# Patient Record
Sex: Female | Born: 1940
Health system: Southern US, Community
[De-identification: ages and names within clinical notes are randomized; demographics above are authoritative.]

## PROBLEM LIST (undated history)

## (undated) DIAGNOSIS — M1990 Unspecified osteoarthritis, unspecified site: Secondary | ICD-10-CM

## (undated) DIAGNOSIS — K219 Gastro-esophageal reflux disease without esophagitis: Secondary | ICD-10-CM

## (undated) DIAGNOSIS — I10 Essential (primary) hypertension: Secondary | ICD-10-CM

## (undated) DIAGNOSIS — L4050 Arthropathic psoriasis, unspecified: Secondary | ICD-10-CM

## (undated) DIAGNOSIS — E785 Hyperlipidemia, unspecified: Secondary | ICD-10-CM

## (undated) HISTORY — PX: NO PAST SURGERIES: SHX2092

---

## 2009-08-01 ENCOUNTER — Inpatient Hospital Stay: Payer: Self-pay | Admitting: Student

## 2009-10-13 ENCOUNTER — Ambulatory Visit: Payer: Self-pay | Admitting: Internal Medicine

## 2010-11-09 ENCOUNTER — Ambulatory Visit: Payer: Self-pay | Admitting: Internal Medicine

## 2010-11-11 ENCOUNTER — Ambulatory Visit: Payer: Self-pay | Admitting: Internal Medicine

## 2011-04-12 ENCOUNTER — Ambulatory Visit: Payer: Self-pay

## 2011-09-14 DIAGNOSIS — H2510 Age-related nuclear cataract, unspecified eye: Secondary | ICD-10-CM | POA: Diagnosis not present

## 2011-09-14 DIAGNOSIS — H40019 Open angle with borderline findings, low risk, unspecified eye: Secondary | ICD-10-CM | POA: Diagnosis not present

## 2011-12-06 ENCOUNTER — Ambulatory Visit: Payer: Self-pay | Admitting: Internal Medicine

## 2011-12-06 DIAGNOSIS — M818 Other osteoporosis without current pathological fracture: Secondary | ICD-10-CM | POA: Diagnosis not present

## 2011-12-06 DIAGNOSIS — D509 Iron deficiency anemia, unspecified: Secondary | ICD-10-CM | POA: Diagnosis not present

## 2011-12-06 DIAGNOSIS — N6459 Other signs and symptoms in breast: Secondary | ICD-10-CM | POA: Diagnosis not present

## 2011-12-06 DIAGNOSIS — Z1231 Encounter for screening mammogram for malignant neoplasm of breast: Secondary | ICD-10-CM | POA: Diagnosis not present

## 2011-12-06 DIAGNOSIS — E039 Hypothyroidism, unspecified: Secondary | ICD-10-CM | POA: Diagnosis not present

## 2011-12-06 DIAGNOSIS — K219 Gastro-esophageal reflux disease without esophagitis: Secondary | ICD-10-CM | POA: Diagnosis not present

## 2011-12-06 DIAGNOSIS — M159 Polyosteoarthritis, unspecified: Secondary | ICD-10-CM | POA: Diagnosis not present

## 2011-12-14 ENCOUNTER — Ambulatory Visit: Payer: Self-pay | Admitting: Internal Medicine

## 2011-12-14 DIAGNOSIS — R928 Other abnormal and inconclusive findings on diagnostic imaging of breast: Secondary | ICD-10-CM | POA: Diagnosis not present

## 2011-12-14 DIAGNOSIS — N6489 Other specified disorders of breast: Secondary | ICD-10-CM | POA: Diagnosis not present

## 2012-04-05 DIAGNOSIS — E039 Hypothyroidism, unspecified: Secondary | ICD-10-CM | POA: Diagnosis not present

## 2012-04-05 DIAGNOSIS — D509 Iron deficiency anemia, unspecified: Secondary | ICD-10-CM | POA: Diagnosis not present

## 2012-04-05 DIAGNOSIS — R030 Elevated blood-pressure reading, without diagnosis of hypertension: Secondary | ICD-10-CM | POA: Diagnosis not present

## 2012-04-05 DIAGNOSIS — M818 Other osteoporosis without current pathological fracture: Secondary | ICD-10-CM | POA: Diagnosis not present

## 2012-05-24 DIAGNOSIS — R5381 Other malaise: Secondary | ICD-10-CM | POA: Diagnosis not present

## 2012-05-24 DIAGNOSIS — E559 Vitamin D deficiency, unspecified: Secondary | ICD-10-CM | POA: Diagnosis not present

## 2012-05-24 DIAGNOSIS — E782 Mixed hyperlipidemia: Secondary | ICD-10-CM | POA: Diagnosis not present

## 2012-05-24 DIAGNOSIS — D509 Iron deficiency anemia, unspecified: Secondary | ICD-10-CM | POA: Diagnosis not present

## 2012-05-24 DIAGNOSIS — Z0000 Encounter for general adult medical examination without abnormal findings: Secondary | ICD-10-CM | POA: Diagnosis not present

## 2012-05-28 DIAGNOSIS — Z23 Encounter for immunization: Secondary | ICD-10-CM | POA: Diagnosis not present

## 2012-09-12 DIAGNOSIS — H2510 Age-related nuclear cataract, unspecified eye: Secondary | ICD-10-CM | POA: Diagnosis not present

## 2012-09-12 DIAGNOSIS — H4720 Unspecified optic atrophy: Secondary | ICD-10-CM | POA: Diagnosis not present

## 2012-09-24 DIAGNOSIS — R112 Nausea with vomiting, unspecified: Secondary | ICD-10-CM | POA: Diagnosis not present

## 2012-09-24 DIAGNOSIS — D509 Iron deficiency anemia, unspecified: Secondary | ICD-10-CM | POA: Diagnosis not present

## 2012-09-24 DIAGNOSIS — M818 Other osteoporosis without current pathological fracture: Secondary | ICD-10-CM | POA: Diagnosis not present

## 2012-09-24 DIAGNOSIS — K219 Gastro-esophageal reflux disease without esophagitis: Secondary | ICD-10-CM | POA: Diagnosis not present

## 2012-09-24 DIAGNOSIS — G4700 Insomnia, unspecified: Secondary | ICD-10-CM | POA: Diagnosis not present

## 2012-09-24 DIAGNOSIS — R030 Elevated blood-pressure reading, without diagnosis of hypertension: Secondary | ICD-10-CM | POA: Diagnosis not present

## 2012-10-18 DIAGNOSIS — M159 Polyosteoarthritis, unspecified: Secondary | ICD-10-CM | POA: Diagnosis not present

## 2012-10-18 DIAGNOSIS — D509 Iron deficiency anemia, unspecified: Secondary | ICD-10-CM | POA: Diagnosis not present

## 2012-10-18 DIAGNOSIS — M818 Other osteoporosis without current pathological fracture: Secondary | ICD-10-CM | POA: Diagnosis not present

## 2012-10-18 DIAGNOSIS — Z0000 Encounter for general adult medical examination without abnormal findings: Secondary | ICD-10-CM | POA: Diagnosis not present

## 2012-10-18 DIAGNOSIS — G4700 Insomnia, unspecified: Secondary | ICD-10-CM | POA: Diagnosis not present

## 2012-10-18 DIAGNOSIS — K219 Gastro-esophageal reflux disease without esophagitis: Secondary | ICD-10-CM | POA: Diagnosis not present

## 2012-10-18 DIAGNOSIS — R030 Elevated blood-pressure reading, without diagnosis of hypertension: Secondary | ICD-10-CM | POA: Diagnosis not present

## 2012-12-13 ENCOUNTER — Ambulatory Visit: Payer: Self-pay

## 2012-12-13 DIAGNOSIS — N63 Unspecified lump in unspecified breast: Secondary | ICD-10-CM | POA: Diagnosis not present

## 2012-12-13 DIAGNOSIS — Z1231 Encounter for screening mammogram for malignant neoplasm of breast: Secondary | ICD-10-CM | POA: Diagnosis not present

## 2013-04-18 DIAGNOSIS — K219 Gastro-esophageal reflux disease without esophagitis: Secondary | ICD-10-CM | POA: Diagnosis not present

## 2013-04-18 DIAGNOSIS — Z23 Encounter for immunization: Secondary | ICD-10-CM | POA: Diagnosis not present

## 2013-04-18 DIAGNOSIS — M818 Other osteoporosis without current pathological fracture: Secondary | ICD-10-CM | POA: Diagnosis not present

## 2013-04-18 DIAGNOSIS — N958 Other specified menopausal and perimenopausal disorders: Secondary | ICD-10-CM | POA: Diagnosis not present

## 2013-05-21 DIAGNOSIS — Z23 Encounter for immunization: Secondary | ICD-10-CM | POA: Diagnosis not present

## 2013-09-25 DIAGNOSIS — H3560 Retinal hemorrhage, unspecified eye: Secondary | ICD-10-CM | POA: Diagnosis not present

## 2013-09-25 DIAGNOSIS — H2510 Age-related nuclear cataract, unspecified eye: Secondary | ICD-10-CM | POA: Diagnosis not present

## 2013-11-27 DIAGNOSIS — H3560 Retinal hemorrhage, unspecified eye: Secondary | ICD-10-CM | POA: Diagnosis not present

## 2013-12-03 DIAGNOSIS — Z0000 Encounter for general adult medical examination without abnormal findings: Secondary | ICD-10-CM | POA: Diagnosis not present

## 2013-12-03 DIAGNOSIS — D691 Qualitative platelet defects: Secondary | ICD-10-CM | POA: Diagnosis not present

## 2013-12-03 DIAGNOSIS — K219 Gastro-esophageal reflux disease without esophagitis: Secondary | ICD-10-CM | POA: Diagnosis not present

## 2013-12-03 DIAGNOSIS — Z79899 Other long term (current) drug therapy: Secondary | ICD-10-CM | POA: Diagnosis not present

## 2013-12-03 DIAGNOSIS — R233 Spontaneous ecchymoses: Secondary | ICD-10-CM | POA: Diagnosis not present

## 2013-12-03 DIAGNOSIS — M159 Polyosteoarthritis, unspecified: Secondary | ICD-10-CM | POA: Diagnosis not present

## 2013-12-17 ENCOUNTER — Ambulatory Visit: Payer: Self-pay | Admitting: Internal Medicine

## 2013-12-17 DIAGNOSIS — Z1231 Encounter for screening mammogram for malignant neoplasm of breast: Secondary | ICD-10-CM | POA: Diagnosis not present

## 2014-04-16 DIAGNOSIS — D509 Iron deficiency anemia, unspecified: Secondary | ICD-10-CM | POA: Diagnosis not present

## 2014-04-16 DIAGNOSIS — M159 Polyosteoarthritis, unspecified: Secondary | ICD-10-CM | POA: Diagnosis not present

## 2014-04-16 DIAGNOSIS — R233 Spontaneous ecchymoses: Secondary | ICD-10-CM | POA: Diagnosis not present

## 2014-04-16 DIAGNOSIS — K219 Gastro-esophageal reflux disease without esophagitis: Secondary | ICD-10-CM | POA: Diagnosis not present

## 2014-04-16 DIAGNOSIS — M818 Other osteoporosis without current pathological fracture: Secondary | ICD-10-CM | POA: Diagnosis not present

## 2014-05-27 DIAGNOSIS — Z23 Encounter for immunization: Secondary | ICD-10-CM | POA: Diagnosis not present

## 2014-08-26 DIAGNOSIS — H52223 Regular astigmatism, bilateral: Secondary | ICD-10-CM | POA: Diagnosis not present

## 2014-08-26 DIAGNOSIS — H2513 Age-related nuclear cataract, bilateral: Secondary | ICD-10-CM | POA: Diagnosis not present

## 2014-08-26 DIAGNOSIS — H4720 Unspecified optic atrophy: Secondary | ICD-10-CM | POA: Diagnosis not present

## 2014-12-22 ENCOUNTER — Ambulatory Visit
Admit: 2014-12-22 | Disposition: A | Discharge status: Home / Self Care | Payer: Self-pay | Attending: Internal Medicine | Admitting: Internal Medicine

## 2014-12-22 DIAGNOSIS — Z1231 Encounter for screening mammogram for malignant neoplasm of breast: Secondary | ICD-10-CM | POA: Diagnosis not present

## 2015-02-09 DIAGNOSIS — D509 Iron deficiency anemia, unspecified: Secondary | ICD-10-CM | POA: Diagnosis not present

## 2015-02-09 DIAGNOSIS — R300 Dysuria: Secondary | ICD-10-CM | POA: Diagnosis not present

## 2015-02-09 DIAGNOSIS — I10 Essential (primary) hypertension: Secondary | ICD-10-CM | POA: Diagnosis not present

## 2015-02-09 DIAGNOSIS — Z0001 Encounter for general adult medical examination with abnormal findings: Secondary | ICD-10-CM | POA: Diagnosis not present

## 2015-02-09 DIAGNOSIS — M810 Age-related osteoporosis without current pathological fracture: Secondary | ICD-10-CM | POA: Diagnosis not present

## 2015-02-09 DIAGNOSIS — E559 Vitamin D deficiency, unspecified: Secondary | ICD-10-CM | POA: Diagnosis not present

## 2015-02-09 DIAGNOSIS — K219 Gastro-esophageal reflux disease without esophagitis: Secondary | ICD-10-CM | POA: Diagnosis not present

## 2015-02-18 DIAGNOSIS — M85852 Other specified disorders of bone density and structure, left thigh: Secondary | ICD-10-CM | POA: Diagnosis not present

## 2015-02-18 DIAGNOSIS — E2839 Other primary ovarian failure: Secondary | ICD-10-CM | POA: Diagnosis not present

## 2015-02-18 DIAGNOSIS — M8588 Other specified disorders of bone density and structure, other site: Secondary | ICD-10-CM | POA: Diagnosis not present

## 2015-02-18 DIAGNOSIS — M859 Disorder of bone density and structure, unspecified: Secondary | ICD-10-CM | POA: Diagnosis not present

## 2015-02-18 DIAGNOSIS — M810 Age-related osteoporosis without current pathological fracture: Secondary | ICD-10-CM | POA: Diagnosis not present

## 2015-05-28 DIAGNOSIS — Z23 Encounter for immunization: Secondary | ICD-10-CM | POA: Diagnosis not present

## 2015-08-10 DIAGNOSIS — D519 Vitamin B12 deficiency anemia, unspecified: Secondary | ICD-10-CM | POA: Diagnosis not present

## 2015-08-10 DIAGNOSIS — D509 Iron deficiency anemia, unspecified: Secondary | ICD-10-CM | POA: Diagnosis not present

## 2015-08-10 DIAGNOSIS — M810 Age-related osteoporosis without current pathological fracture: Secondary | ICD-10-CM | POA: Diagnosis not present

## 2015-08-10 DIAGNOSIS — E785 Hyperlipidemia, unspecified: Secondary | ICD-10-CM | POA: Diagnosis not present

## 2016-03-29 DIAGNOSIS — H547 Unspecified visual loss: Secondary | ICD-10-CM | POA: Diagnosis not present

## 2016-03-29 DIAGNOSIS — K219 Gastro-esophageal reflux disease without esophagitis: Secondary | ICD-10-CM | POA: Diagnosis not present

## 2016-03-29 DIAGNOSIS — Z6822 Body mass index (BMI) 22.0-22.9, adult: Secondary | ICD-10-CM | POA: Diagnosis not present

## 2016-07-04 ENCOUNTER — Other Ambulatory Visit: Payer: Self-pay | Admitting: Family Medicine

## 2016-07-04 DIAGNOSIS — Z23 Encounter for immunization: Secondary | ICD-10-CM | POA: Diagnosis not present

## 2016-07-04 DIAGNOSIS — Z1231 Encounter for screening mammogram for malignant neoplasm of breast: Secondary | ICD-10-CM

## 2016-07-04 DIAGNOSIS — K219 Gastro-esophageal reflux disease without esophagitis: Secondary | ICD-10-CM | POA: Diagnosis not present

## 2016-07-18 ENCOUNTER — Ambulatory Visit
Admission: RE | Admit: 2016-07-18 | Discharge: 2016-07-18 | Disposition: A | Discharge status: Home / Self Care | Payer: Commercial Managed Care - HMO | Source: Ambulatory Visit | Attending: Family Medicine | Admitting: Family Medicine

## 2016-07-18 ENCOUNTER — Encounter: Payer: Self-pay | Admitting: Radiology

## 2016-07-18 DIAGNOSIS — Z1231 Encounter for screening mammogram for malignant neoplasm of breast: Secondary | ICD-10-CM

## 2016-12-13 DIAGNOSIS — M79644 Pain in right finger(s): Secondary | ICD-10-CM | POA: Diagnosis not present

## 2016-12-13 DIAGNOSIS — M79645 Pain in left finger(s): Secondary | ICD-10-CM | POA: Diagnosis not present

## 2017-01-04 DIAGNOSIS — M06842 Other specified rheumatoid arthritis, left hand: Secondary | ICD-10-CM | POA: Diagnosis not present

## 2017-01-04 DIAGNOSIS — M65841 Other synovitis and tenosynovitis, right hand: Secondary | ICD-10-CM | POA: Diagnosis not present

## 2017-01-04 DIAGNOSIS — M216X2 Other acquired deformities of left foot: Secondary | ICD-10-CM | POA: Diagnosis not present

## 2017-01-04 DIAGNOSIS — M21961 Unspecified acquired deformity of right lower leg: Secondary | ICD-10-CM | POA: Diagnosis not present

## 2017-01-04 DIAGNOSIS — M21962 Unspecified acquired deformity of left lower leg: Secondary | ICD-10-CM | POA: Diagnosis not present

## 2017-01-04 DIAGNOSIS — E785 Hyperlipidemia, unspecified: Secondary | ICD-10-CM | POA: Diagnosis not present

## 2017-01-04 DIAGNOSIS — R918 Other nonspecific abnormal finding of lung field: Secondary | ICD-10-CM | POA: Diagnosis not present

## 2017-01-04 DIAGNOSIS — M810 Age-related osteoporosis without current pathological fracture: Secondary | ICD-10-CM | POA: Diagnosis not present

## 2017-01-04 DIAGNOSIS — R7989 Other specified abnormal findings of blood chemistry: Secondary | ICD-10-CM | POA: Diagnosis not present

## 2017-01-04 DIAGNOSIS — M7731 Calcaneal spur, right foot: Secondary | ICD-10-CM | POA: Diagnosis not present

## 2017-01-04 DIAGNOSIS — I10 Essential (primary) hypertension: Secondary | ICD-10-CM | POA: Diagnosis not present

## 2017-01-04 DIAGNOSIS — M1990 Unspecified osteoarthritis, unspecified site: Secondary | ICD-10-CM | POA: Diagnosis not present

## 2017-01-04 DIAGNOSIS — Z79899 Other long term (current) drug therapy: Secondary | ICD-10-CM | POA: Diagnosis not present

## 2017-01-04 DIAGNOSIS — M06841 Other specified rheumatoid arthritis, right hand: Secondary | ICD-10-CM | POA: Diagnosis not present

## 2017-01-04 DIAGNOSIS — M65842 Other synovitis and tenosynovitis, left hand: Secondary | ICD-10-CM | POA: Diagnosis not present

## 2017-01-05 ENCOUNTER — Other Ambulatory Visit: Payer: Self-pay | Admitting: Family Medicine

## 2017-01-05 DIAGNOSIS — Z1239 Encounter for other screening for malignant neoplasm of breast: Secondary | ICD-10-CM

## 2017-02-15 DIAGNOSIS — M0600 Rheumatoid arthritis without rheumatoid factor, unspecified site: Secondary | ICD-10-CM | POA: Diagnosis not present

## 2017-02-15 DIAGNOSIS — Z79899 Other long term (current) drug therapy: Secondary | ICD-10-CM | POA: Diagnosis not present

## 2017-02-15 DIAGNOSIS — M1990 Unspecified osteoarthritis, unspecified site: Secondary | ICD-10-CM | POA: Diagnosis not present

## 2017-04-12 DIAGNOSIS — Z79899 Other long term (current) drug therapy: Secondary | ICD-10-CM | POA: Diagnosis not present

## 2017-04-12 DIAGNOSIS — M0600 Rheumatoid arthritis without rheumatoid factor, unspecified site: Secondary | ICD-10-CM | POA: Diagnosis not present

## 2017-04-20 DIAGNOSIS — Z79899 Other long term (current) drug therapy: Secondary | ICD-10-CM | POA: Diagnosis not present

## 2017-04-20 DIAGNOSIS — M0600 Rheumatoid arthritis without rheumatoid factor, unspecified site: Secondary | ICD-10-CM | POA: Diagnosis not present

## 2017-04-20 DIAGNOSIS — I10 Essential (primary) hypertension: Secondary | ICD-10-CM | POA: Diagnosis not present

## 2017-04-20 DIAGNOSIS — E785 Hyperlipidemia, unspecified: Secondary | ICD-10-CM | POA: Diagnosis not present

## 2017-04-20 DIAGNOSIS — M810 Age-related osteoporosis without current pathological fracture: Secondary | ICD-10-CM | POA: Diagnosis not present

## 2017-07-31 DIAGNOSIS — H524 Presbyopia: Secondary | ICD-10-CM | POA: Diagnosis not present

## 2017-07-31 DIAGNOSIS — H40003 Preglaucoma, unspecified, bilateral: Secondary | ICD-10-CM | POA: Diagnosis not present

## 2017-07-31 DIAGNOSIS — H2513 Age-related nuclear cataract, bilateral: Secondary | ICD-10-CM | POA: Diagnosis not present

## 2017-08-16 DIAGNOSIS — Z79899 Other long term (current) drug therapy: Secondary | ICD-10-CM | POA: Diagnosis not present

## 2017-08-16 DIAGNOSIS — I10 Essential (primary) hypertension: Secondary | ICD-10-CM | POA: Diagnosis not present

## 2017-08-16 DIAGNOSIS — K219 Gastro-esophageal reflux disease without esophagitis: Secondary | ICD-10-CM | POA: Diagnosis not present

## 2017-08-16 DIAGNOSIS — E785 Hyperlipidemia, unspecified: Secondary | ICD-10-CM | POA: Diagnosis not present

## 2017-08-16 DIAGNOSIS — Z23 Encounter for immunization: Secondary | ICD-10-CM | POA: Diagnosis not present

## 2017-10-31 ENCOUNTER — Ambulatory Visit
Admission: RE | Admit: 2017-10-31 | Discharge: 2017-10-31 | Disposition: A | Discharge status: Home / Self Care | Payer: Medicare HMO | Source: Ambulatory Visit | Attending: Family Medicine | Admitting: Family Medicine

## 2017-10-31 ENCOUNTER — Other Ambulatory Visit: Payer: Self-pay | Admitting: Family Medicine

## 2017-10-31 DIAGNOSIS — Z1239 Encounter for other screening for malignant neoplasm of breast: Secondary | ICD-10-CM

## 2017-10-31 DIAGNOSIS — Z1231 Encounter for screening mammogram for malignant neoplasm of breast: Secondary | ICD-10-CM | POA: Diagnosis not present

## 2017-11-08 DIAGNOSIS — Z79899 Other long term (current) drug therapy: Secondary | ICD-10-CM | POA: Diagnosis not present

## 2017-11-08 DIAGNOSIS — M0600 Rheumatoid arthritis without rheumatoid factor, unspecified site: Secondary | ICD-10-CM | POA: Diagnosis not present

## 2018-02-14 DIAGNOSIS — M21961 Unspecified acquired deformity of right lower leg: Secondary | ICD-10-CM | POA: Diagnosis not present

## 2018-02-14 DIAGNOSIS — M1990 Unspecified osteoarthritis, unspecified site: Secondary | ICD-10-CM | POA: Diagnosis not present

## 2018-02-14 DIAGNOSIS — M810 Age-related osteoporosis without current pathological fracture: Secondary | ICD-10-CM | POA: Diagnosis not present

## 2018-02-14 DIAGNOSIS — M0600 Rheumatoid arthritis without rheumatoid factor, unspecified site: Secondary | ICD-10-CM | POA: Diagnosis not present

## 2018-02-14 DIAGNOSIS — M21962 Unspecified acquired deformity of left lower leg: Secondary | ICD-10-CM | POA: Diagnosis not present

## 2018-02-14 DIAGNOSIS — Z79899 Other long term (current) drug therapy: Secondary | ICD-10-CM | POA: Diagnosis not present

## 2018-02-21 DIAGNOSIS — I10 Essential (primary) hypertension: Secondary | ICD-10-CM | POA: Diagnosis not present

## 2018-02-21 DIAGNOSIS — E785 Hyperlipidemia, unspecified: Secondary | ICD-10-CM | POA: Diagnosis not present

## 2018-02-21 DIAGNOSIS — Z1231 Encounter for screening mammogram for malignant neoplasm of breast: Secondary | ICD-10-CM | POA: Diagnosis not present

## 2018-05-17 DIAGNOSIS — M21961 Unspecified acquired deformity of right lower leg: Secondary | ICD-10-CM | POA: Diagnosis not present

## 2018-05-17 DIAGNOSIS — M0600 Rheumatoid arthritis without rheumatoid factor, unspecified site: Secondary | ICD-10-CM | POA: Diagnosis not present

## 2018-05-17 DIAGNOSIS — L819 Disorder of pigmentation, unspecified: Secondary | ICD-10-CM | POA: Diagnosis not present

## 2018-05-17 DIAGNOSIS — M21962 Unspecified acquired deformity of left lower leg: Secondary | ICD-10-CM | POA: Diagnosis not present

## 2019-02-16 ENCOUNTER — Inpatient Hospital Stay
Admission: EM | Admit: 2019-02-16 | Discharge: 2019-03-15 | DRG: 808 | Disposition: A | Discharge status: Hospice | Payer: Medicare HMO | Attending: Internal Medicine | Admitting: Internal Medicine

## 2019-02-16 ENCOUNTER — Emergency Department: Payer: Medicare HMO

## 2019-02-16 ENCOUNTER — Encounter: Payer: Self-pay | Admitting: Emergency Medicine

## 2019-02-16 ENCOUNTER — Other Ambulatory Visit: Payer: Self-pay

## 2019-02-16 DIAGNOSIS — E872 Acidosis: Secondary | ICD-10-CM | POA: Diagnosis present

## 2019-02-16 DIAGNOSIS — N139 Obstructive and reflux uropathy, unspecified: Secondary | ICD-10-CM

## 2019-02-16 DIAGNOSIS — L8990 Pressure ulcer of unspecified site, unspecified stage: Secondary | ICD-10-CM | POA: Insufficient documentation

## 2019-02-16 DIAGNOSIS — M0600 Rheumatoid arthritis without rheumatoid factor, unspecified site: Secondary | ICD-10-CM | POA: Diagnosis not present

## 2019-02-16 DIAGNOSIS — L89151 Pressure ulcer of sacral region, stage 1: Secondary | ICD-10-CM | POA: Diagnosis present

## 2019-02-16 DIAGNOSIS — G9341 Metabolic encephalopathy: Secondary | ICD-10-CM | POA: Diagnosis present

## 2019-02-16 DIAGNOSIS — K449 Diaphragmatic hernia without obstruction or gangrene: Secondary | ICD-10-CM | POA: Diagnosis present

## 2019-02-16 DIAGNOSIS — E870 Hyperosmolality and hypernatremia: Secondary | ICD-10-CM | POA: Diagnosis present

## 2019-02-16 DIAGNOSIS — Z79899 Other long term (current) drug therapy: Secondary | ICD-10-CM

## 2019-02-16 DIAGNOSIS — T451X5A Adverse effect of antineoplastic and immunosuppressive drugs, initial encounter: Secondary | ICD-10-CM | POA: Diagnosis present

## 2019-02-16 DIAGNOSIS — Z20828 Contact with and (suspected) exposure to other viral communicable diseases: Secondary | ICD-10-CM | POA: Diagnosis present

## 2019-02-16 DIAGNOSIS — R319 Hematuria, unspecified: Secondary | ICD-10-CM | POA: Diagnosis not present

## 2019-02-16 DIAGNOSIS — R0602 Shortness of breath: Secondary | ICD-10-CM

## 2019-02-16 DIAGNOSIS — E871 Hypo-osmolality and hyponatremia: Secondary | ICD-10-CM | POA: Diagnosis not present

## 2019-02-16 DIAGNOSIS — Z0189 Encounter for other specified special examinations: Secondary | ICD-10-CM

## 2019-02-16 DIAGNOSIS — Z7189 Other specified counseling: Secondary | ICD-10-CM

## 2019-02-16 DIAGNOSIS — B370 Candidal stomatitis: Secondary | ICD-10-CM | POA: Diagnosis present

## 2019-02-16 DIAGNOSIS — J810 Acute pulmonary edema: Secondary | ICD-10-CM | POA: Diagnosis not present

## 2019-02-16 DIAGNOSIS — E785 Hyperlipidemia, unspecified: Secondary | ICD-10-CM | POA: Diagnosis present

## 2019-02-16 DIAGNOSIS — R627 Adult failure to thrive: Secondary | ICD-10-CM | POA: Diagnosis present

## 2019-02-16 DIAGNOSIS — D61810 Antineoplastic chemotherapy induced pancytopenia: Principal | ICD-10-CM | POA: Diagnosis present

## 2019-02-16 DIAGNOSIS — Z6822 Body mass index (BMI) 22.0-22.9, adult: Secondary | ICD-10-CM

## 2019-02-16 DIAGNOSIS — Z5689 Other problems related to employment: Secondary | ICD-10-CM

## 2019-02-16 DIAGNOSIS — J690 Pneumonitis due to inhalation of food and vomit: Secondary | ICD-10-CM | POA: Diagnosis not present

## 2019-02-16 DIAGNOSIS — R609 Edema, unspecified: Secondary | ICD-10-CM

## 2019-02-16 DIAGNOSIS — E875 Hyperkalemia: Secondary | ICD-10-CM | POA: Diagnosis present

## 2019-02-16 DIAGNOSIS — K219 Gastro-esophageal reflux disease without esophagitis: Secondary | ICD-10-CM | POA: Diagnosis present

## 2019-02-16 DIAGNOSIS — E8770 Fluid overload, unspecified: Secondary | ICD-10-CM | POA: Diagnosis not present

## 2019-02-16 DIAGNOSIS — R17 Unspecified jaundice: Secondary | ICD-10-CM | POA: Diagnosis present

## 2019-02-16 DIAGNOSIS — Z4659 Encounter for fitting and adjustment of other gastrointestinal appliance and device: Secondary | ICD-10-CM

## 2019-02-16 DIAGNOSIS — R6251 Failure to thrive (child): Secondary | ICD-10-CM

## 2019-02-16 DIAGNOSIS — K1231 Oral mucositis (ulcerative) due to antineoplastic therapy: Secondary | ICD-10-CM | POA: Diagnosis present

## 2019-02-16 DIAGNOSIS — Z515 Encounter for palliative care: Secondary | ICD-10-CM

## 2019-02-16 DIAGNOSIS — R21 Rash and other nonspecific skin eruption: Secondary | ICD-10-CM | POA: Diagnosis present

## 2019-02-16 DIAGNOSIS — K121 Other forms of stomatitis: Secondary | ICD-10-CM

## 2019-02-16 DIAGNOSIS — E876 Hypokalemia: Secondary | ICD-10-CM | POA: Diagnosis not present

## 2019-02-16 DIAGNOSIS — D638 Anemia in other chronic diseases classified elsewhere: Secondary | ICD-10-CM | POA: Diagnosis present

## 2019-02-16 DIAGNOSIS — R0902 Hypoxemia: Secondary | ICD-10-CM

## 2019-02-16 DIAGNOSIS — E860 Dehydration: Secondary | ICD-10-CM | POA: Diagnosis present

## 2019-02-16 DIAGNOSIS — R5081 Fever presenting with conditions classified elsewhere: Secondary | ICD-10-CM | POA: Diagnosis present

## 2019-02-16 DIAGNOSIS — Z66 Do not resuscitate: Secondary | ICD-10-CM | POA: Diagnosis present

## 2019-02-16 DIAGNOSIS — H109 Unspecified conjunctivitis: Secondary | ICD-10-CM | POA: Diagnosis not present

## 2019-02-16 DIAGNOSIS — Z8261 Family history of arthritis: Secondary | ICD-10-CM

## 2019-02-16 DIAGNOSIS — D709 Neutropenia, unspecified: Secondary | ICD-10-CM | POA: Diagnosis not present

## 2019-02-16 DIAGNOSIS — R945 Abnormal results of liver function studies: Secondary | ICD-10-CM | POA: Diagnosis not present

## 2019-02-16 DIAGNOSIS — E43 Unspecified severe protein-calorie malnutrition: Secondary | ICD-10-CM | POA: Diagnosis present

## 2019-02-16 DIAGNOSIS — R0682 Tachypnea, not elsewhere classified: Secondary | ICD-10-CM

## 2019-02-16 DIAGNOSIS — R509 Fever, unspecified: Secondary | ICD-10-CM

## 2019-02-16 DIAGNOSIS — M810 Age-related osteoporosis without current pathological fracture: Secondary | ICD-10-CM | POA: Diagnosis present

## 2019-02-16 DIAGNOSIS — R1310 Dysphagia, unspecified: Secondary | ICD-10-CM

## 2019-02-16 DIAGNOSIS — D61811 Other drug-induced pancytopenia: Secondary | ICD-10-CM | POA: Diagnosis not present

## 2019-02-16 DIAGNOSIS — L4050 Arthropathic psoriasis, unspecified: Secondary | ICD-10-CM | POA: Diagnosis present

## 2019-02-16 DIAGNOSIS — I10 Essential (primary) hypertension: Secondary | ICD-10-CM | POA: Diagnosis present

## 2019-02-16 HISTORY — DX: Gastro-esophageal reflux disease without esophagitis: K21.9

## 2019-02-16 HISTORY — DX: Essential (primary) hypertension: I10

## 2019-02-16 HISTORY — DX: Hyperlipidemia, unspecified: E78.5

## 2019-02-16 HISTORY — DX: Arthropathic psoriasis, unspecified: L40.50

## 2019-02-16 HISTORY — DX: Unspecified osteoarthritis, unspecified site: M19.90

## 2019-02-16 LAB — CBC WITH DIFFERENTIAL/PLATELET
Abs Immature Granulocytes: 0 10*3/uL (ref 0.00–0.07)
Basophils Absolute: 0 10*3/uL (ref 0.0–0.1)
Basophils Relative: 0 %
Eosinophils Absolute: 0.1 10*3/uL (ref 0.0–0.5)
Eosinophils Relative: 13 %
HCT: 27.5 % — ABNORMAL LOW (ref 36.0–46.0)
Hemoglobin: 9.8 g/dL — ABNORMAL LOW (ref 12.0–15.0)
Immature Granulocytes: 0 %
Lymphocytes Relative: 18 %
Lymphs Abs: 0.1 10*3/uL — ABNORMAL LOW (ref 0.7–4.0)
MCH: 35.3 pg — ABNORMAL HIGH (ref 26.0–34.0)
MCHC: 35.6 g/dL (ref 30.0–36.0)
MCV: 98.9 fL (ref 80.0–100.0)
Monocytes Absolute: 0 10*3/uL — ABNORMAL LOW (ref 0.1–1.0)
Monocytes Relative: 5 %
Neutro Abs: 0.3 10*3/uL — ABNORMAL LOW (ref 1.7–7.7)
Neutrophils Relative %: 64 %
Platelets: UNDETERMINED 10*3/uL (ref 150–400)
RBC: 2.78 MIL/uL — ABNORMAL LOW (ref 3.87–5.11)
RDW: 15 % (ref 11.5–15.5)
WBC: 0.4 10*3/uL — CL (ref 4.0–10.5)
nRBC: 0 % (ref 0.0–0.2)

## 2019-02-16 LAB — URINALYSIS, COMPLETE (UACMP) WITH MICROSCOPIC
Glucose, UA: NEGATIVE mg/dL
Hgb urine dipstick: NEGATIVE
Ketones, ur: 5 mg/dL — AB
Leukocytes,Ua: NEGATIVE
Nitrite: NEGATIVE
Protein, ur: 30 mg/dL — AB
Specific Gravity, Urine: 1.025 (ref 1.005–1.030)
pH: 5 (ref 5.0–8.0)

## 2019-02-16 LAB — COMPREHENSIVE METABOLIC PANEL
ALT: 25 U/L (ref 0–44)
AST: 21 U/L (ref 15–41)
Albumin: 2.9 g/dL — ABNORMAL LOW (ref 3.5–5.0)
Alkaline Phosphatase: 45 U/L (ref 38–126)
Anion gap: 13 (ref 5–15)
BUN: 25 mg/dL — ABNORMAL HIGH (ref 8–23)
CO2: 21 mmol/L — ABNORMAL LOW (ref 22–32)
Calcium: 7.7 mg/dL — ABNORMAL LOW (ref 8.9–10.3)
Chloride: 93 mmol/L — ABNORMAL LOW (ref 98–111)
Creatinine, Ser: 0.77 mg/dL (ref 0.44–1.00)
GFR calc Af Amer: >60 mL/min (ref >60)
GFR calc non Af Amer: >60 mL/min (ref >60)
Glucose, Bld: 179 mg/dL — ABNORMAL HIGH (ref 70–99)
Potassium: 4 mmol/L (ref 3.5–5.1)
Sodium: 127 mmol/L — ABNORMAL LOW (ref 135–145)
Total Bilirubin: 7.7 mg/dL — ABNORMAL HIGH (ref 0.3–1.2)
Total Protein: 6.4 g/dL — ABNORMAL LOW (ref 6.5–8.1)

## 2019-02-16 LAB — RETICULOCYTES
RBC.: 2.23 MIL/uL — ABNORMAL LOW (ref 3.87–5.11)
Retic Count, Absolute: 5.4 10*3/uL — ABNORMAL LOW (ref 19.0–186.0)
Retic Ct Pct: 0.2 % — ABNORMAL LOW (ref 0.4–3.1)

## 2019-02-16 LAB — LACTIC ACID, PLASMA
Lactic Acid, Venous: 2.4 mmol/L (ref 0.5–1.9)
Lactic Acid, Venous: 1.5 mmol/L (ref 0.5–1.9)

## 2019-02-16 LAB — SARS CORONAVIRUS 2 BY RT PCR (HOSPITAL ORDER, PERFORMED IN ~~LOC~~ HOSPITAL LAB): SARS Coronavirus 2: NEGATIVE

## 2019-02-16 LAB — PLATELET COUNT
Platelets: 20 10*3/uL — CL (ref 150–400)
Platelets: 17 10*3/uL — CL (ref 150–400)

## 2019-02-16 LAB — BILIRUBIN, FRACTIONATED(TOT/DIR/INDIR)
Bilirubin, Direct: 4.3 mg/dL — ABNORMAL HIGH (ref 0.0–0.2)
Indirect Bilirubin: 2.6 mg/dL — ABNORMAL HIGH (ref 0.3–0.9)
Total Bilirubin: 6.9 mg/dL — ABNORMAL HIGH (ref 0.3–1.2)

## 2019-02-16 LAB — PROTIME-INR
INR: 1.2 (ref 0.8–1.2)
Prothrombin Time: 14.9 seconds (ref 11.4–15.2)

## 2019-02-16 LAB — LACTATE DEHYDROGENASE: LDH: 116 U/L (ref 98–192)

## 2019-02-16 LAB — TECHNOLOGIST SMEAR REVIEW

## 2019-02-16 MED ORDER — ACETAMINOPHEN 325 MG PO TABS
650.0000 mg | ORAL_TABLET | Freq: Once | ORAL | Status: DC | PRN
  Filled 2019-02-16 (×4): qty 2

## 2019-02-16 MED ORDER — ONDANSETRON HCL 4 MG/2ML IJ SOLN: 4.0000 mg | Freq: Four times a day (QID) | INTRAMUSCULAR | Status: DC | PRN

## 2019-02-16 MED ORDER — VANCOMYCIN HCL 10 G IV SOLR: 1500.0000 mg | Freq: Once | INTRAVENOUS | Status: DC

## 2019-02-16 MED ORDER — ONDANSETRON HCL 4 MG PO TABS
4.0000 mg | ORAL_TABLET | Freq: Four times a day (QID) | ORAL | Status: DC | PRN
  Administered 2019-02-25: 4 mg via ORAL
  Filled 2019-02-16: qty 1

## 2019-02-16 MED ORDER — SODIUM CHLORIDE 0.9 % IV SOLN
2.0000 g | Freq: Once | INTRAVENOUS | Status: AC
  Administered 2019-02-16: 2 g via INTRAVENOUS
  Filled 2019-02-16: qty 2

## 2019-02-16 MED ORDER — TBO-FILGRASTIM 300 MCG/0.5ML ~~LOC~~ SOSY
300.0000 ug | PREFILLED_SYRINGE | Freq: Once | SUBCUTANEOUS | Status: AC
  Administered 2019-02-16: 23:00:00 300 ug via SUBCUTANEOUS
  Filled 2019-02-16: qty 0.5

## 2019-02-16 MED ORDER — VANCOMYCIN HCL IN DEXTROSE 1-5 GM/200ML-% IV SOLN
1000.0000 mg | Freq: Once | INTRAVENOUS | Status: DC
  Filled 2019-02-16: qty 200

## 2019-02-16 MED ORDER — SODIUM CHLORIDE 0.9% FLUSH: 3.0000 mL | Freq: Once | INTRAVENOUS | Status: DC

## 2019-02-16 MED ORDER — ACETAMINOPHEN 650 MG RE SUPP
650.0000 mg | Freq: Four times a day (QID) | RECTAL | Status: DC | PRN
  Administered 2019-02-17: 650 mg via RECTAL
  Filled 2019-02-16: qty 1

## 2019-02-16 MED ORDER — SODIUM CHLORIDE 0.9 % IV BOLUS
500.0000 mL | Freq: Once | INTRAVENOUS | Status: AC
  Administered 2019-02-16: 500 mL via INTRAVENOUS

## 2019-02-16 MED ORDER — LEUCOVORIN CALCIUM INJECTION 100 MG
10.0000 mg | Freq: Two times a day (BID) | INTRAMUSCULAR | Status: DC
  Administered 2019-02-16 – 2019-02-19 (×6): 10 mg via INTRAVENOUS
  Filled 2019-02-16 (×7): qty 0.5

## 2019-02-16 MED ORDER — SODIUM CHLORIDE 0.9 % IV SOLN
2.0000 g | Freq: Two times a day (BID) | INTRAVENOUS | Status: AC
  Administered 2019-02-17 – 2019-02-20 (×8): 2 g via INTRAVENOUS
  Filled 2019-02-16 (×11): qty 2

## 2019-02-16 MED ORDER — SODIUM CHLORIDE 0.9 % IV SOLN
INTRAVENOUS | Status: DC
  Administered 2019-02-16 – 2019-02-19 (×4): via INTRAVENOUS

## 2019-02-16 MED ORDER — ACETAMINOPHEN 325 MG PO TABS
650.0000 mg | ORAL_TABLET | Freq: Four times a day (QID) | ORAL | Status: DC | PRN
  Administered 2019-03-01 – 2019-03-14 (×2): 650 mg via ORAL
  Filled 2019-02-16: qty 2

## 2019-02-16 MED ORDER — FLUCONAZOLE 100MG IVPB
100.0000 mg | INTRAVENOUS | Status: DC
  Administered 2019-02-16 – 2019-02-21 (×6): 100 mg via INTRAVENOUS
  Filled 2019-02-16 (×8): qty 50

## 2019-02-16 NOTE — Consult Note (Signed)
Pharmacy Antibiotic Note  Claudia Case is a 78 y.o. female admitted on 02/16/2019 with febrile neutropenia.  Pharmacy has been consulted for Cefepime dosing.  Plan: Cefepime 2g q12h  Height: 5' 4.5" (163.8 cm) Weight: 134 lb (60.8 kg) IBW/kg (Calculated) : 55.85  Temp (24hrs), Avg:100.6 F (38.1 C), Min:100.6 F (38.1 C), Max:100.6 F (38.1 C)  Recent Labs  Lab 02/16/19 1514 02/16/19 1642 02/16/19 1920  WBC  --  0.4*  --   CREATININE 0.77  --   --   LATICACIDVEN 2.4*  --  1.5    Estimated Creatinine Clearance: 51.1 mL/min (by C-G formula based on SCr of 0.77 mg/dL).    No Known Allergies  Antimicrobials this admission: Cefepime 6/20 >> Fluconazole 6/20 >>  Dose adjustments this admission: None  Microbiology results: 6/20 BCx: pending  6/20 UCx: pending 6/20 C Diff/GI panel: pending Thank you for allowing pharmacy to be a part of this patient's care.  Lu Duffel, PharmD, BCPS Clinical Pharmacist 02/16/2019 9:19 PM

## 2019-02-16 NOTE — ED Provider Notes (Addendum)
[facility] Emergency Department Provider Note  ____________________________________________   I have reviewed the triage vital signs and the nursing notes. Where available I have reviewed prior notes and, if possible and indicated, outside hospital notes.    HISTORY  Chief Complaint Hematuria and Emesis    HPI Claudia Case is a 78 y.o. female  Patient seen and evaluated during the coronavirus epidemic during a time with low staffing, patient is a poor historian.  Level 5 chart caveat; no further history available due to patient status.  Patient here with she states feeling hematuria at night and nausea and occasional vomiting.  Also she has had throat pain and difficulty swallowing for some time.  Trouble getting pills down she states.  She denies fever at home.  She does not have a cough.  She denies dysuria but she states that she also has skin breakdown around her bottom.  She apparently did have diarrhea last week.  She states that that diarrhea is resolved.  She was seen by her doctor on the 15th, she was noted to have a oral thrush started on nystatin.  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  She thinks that it is about the same as it always has been.  Is worse when she is in the sun.  She denies itching.  Denies melena or bright red blood per rectum.  Patient was on methotrexate, and still apparently is, also was on Plaquenil but no longer is.  Patient is here because she feels generally weak has hematuria and has been nauseated with some nonbloody vomiting.    Past Medical History:  Diagnosis Date  . Arthritis   . GERD (gastroesophageal reflux disease)   . Hyperlipidemia   . Hypertension   . Psoriatic arthritis (HCC)     There are no active problems to display for this patient.   No past surgical history on file.  Prior to  Admission medications   Not on File    Allergies Patient has no known allergies.  Family History  Problem Relation Age of Onset  . Breast cancer Sister 6070    Social History Social History   Tobacco Use  . Smoking status: Never Smoker  . Smokeless tobacco: Never Used  Substance Use Topics  . Alcohol use: Not on file  . Drug use: Not on file    Review of Systems Constitutional: No fever/chills Eyes: No visual changes. ENT: + sore throat. No stiff neck no neck pain Cardiovascular: Denies chest pain. Respiratory: Denies shortness of breath. Gastrointestinal:   See HPI vomiting.  No diarrhea.  No constipation. Genitourinary: Negative for dysuria. Musculoskeletal: Negative lower extremity swelling Skin: The HPI for rash. Neurological: Negative for severe headaches, focal weakness or numbness.   ____________________________________________   PHYSICAL EXAM:  VITAL SIGNS: ED Triage Vitals  Enc Vitals Group     BP 02/16/19 1502 (!) 115/50     Pulse Rate 02/16/19 1502 (!) 106     Resp 02/16/19 1502 20     Temp 02/16/19 1502 (!) 100.6 F (38.1 C)     Temp Source 02/16/19 1502 Oral     SpO2 02/16/19 1502 99 %     Weight 02/16/19 1502 134 lb (60.8 kg)     Height 02/16/19 1502 5' 4.5" (1.638 m)     Head Circumference --  Peak Flow --      Pain Score 02/16/19 1510 1     Pain Loc --      Pain Edu? --      Excl. in GC? --     Constitutional: Alert and oriented to name and place unsure of the exact date,.  In no acute medical distress at this time Eyes: Conjunctivae are normal Head: Atraumatic HEENT: No congestion/rhinnorhea. Mucous membranes are moist.  Oropharynx is mildly erythematous. Neck:   Nontender with no meningismus, no masses, no stridor Cardiovascular: Normal rate, regular rhythm. Grossly normal heart sounds.  Good peripheral circulation. Respiratory: Normal respiratory effort.  No retractions. Lungs CTAB. Abdominal: Soft and nontender. No distention.  No guarding no rebound Back:  There is no focal tenderness or step off.  there is no midline tenderness there are no lesions noted. there is no CVA tenderness Musculoskeletal: No lower extremity tenderness, no upper extremity tenderness. No joint effusions, no DVT signs strong distal pulses no edema Neurologic:  Normal speech and language. No gross focal neurologic deficits are appreciated.  Skin:  Skin is warm,.  There is diffuse dark erythematous rash mostly in sun exposed areas around the arms and neck and and a little bit on the lower legs primarily.  Is chronic appearing.  Patient reports that is been there for "years".  There is no Nikolsky sign.  He also has significant skin breakdown in her gluteal cleft. Psychiatric: Mood and affect are normal. Speech and behavior are normal.  ____________________________________________   LABS (all labs ordered are listed, but only abnormal results are displayed)  Labs Reviewed  COMPREHENSIVE METABOLIC PANEL - Abnormal; Notable for the following components:      Result Value   Sodium 127 (*)    Chloride 93 (*)    CO2 21 (*)    Glucose, Bld 179 (*)    BUN 25 (*)    Calcium 7.7 (*)    Total Protein 6.4 (*)    Albumin 2.9 (*)    Total Bilirubin 7.7 (*)    All other components within normal limits  LACTIC ACID, PLASMA - Abnormal; Notable for the following components:   Lactic Acid, Venous 2.4 (*)    All other components within normal limits  URINALYSIS, COMPLETE (UACMP) WITH MICROSCOPIC - Abnormal; Notable for the following components:   Color, Urine AMBER (*)    APPearance HAZY (*)    Bilirubin Urine SMALL (*)    Ketones, ur 5 (*)    Protein, ur 30 (*)    Bacteria, UA RARE (*)    All other components within normal limits  CULTURE, BLOOD (ROUTINE X 2)  CULTURE, BLOOD (ROUTINE X 2)  SARS CORONAVIRUS 2 (HOSPITAL ORDER, PERFORMED IN [facility])  URINE CULTURE  PROTIME-INR  LACTIC ACID, PLASMA  CBC WITH DIFFERENTIAL/PLATELET   CBC WITH DIFFERENTIAL/PLATELET    Pertinent labs  results that were available during my care of the patient were reviewed by me and considered in my medical decision making (see chart for details). ____________________________________________  EKG  I personally interpreted any EKGs ordered by me or triage  ____________________________________________  RADIOLOGY  Pertinent labs & imaging results that were available during my care of the patient were reviewed by me and considered in my medical decision making (see chart for details). If possible, patient and/or family made aware of any abnormal findings.  No results found. ____________________________________________    PROCEDURES  Procedure(s) performed: None  Procedures  Critical Care performed:  CRITICAL CARE Performed by: Schuyler Amor   Total critical care time: 55 minutes  Critical care time was exclusive of separately billable procedures and treating other patients.  Critical care was necessary to treat or prevent imminent or life-threatening deterioration.  Critical care was time spent personally by me on the following activities: development of treatment plan with patient and/or surrogate as well as nursing, discussions with consultants, evaluation of patient's response to treatment, examination of patient, obtaining history from patient or surrogate, ordering and performing treatments and interventions, ordering and review of laboratory studies, ordering and review of radiographic studies, pulse oximetry and re-evaluation of patient's condition.   ____________________________________________   INITIAL IMPRESSION / ASSESSMENT AND PLAN / ED COURSE  Pertinent labs & imaging results that were available during my care of the patient were reviewed by me and considered in my medical decision making (see chart for details). Patient here with diffuse rash which is been there for years apparently, as well as feeling  dehydrated decreased p.o., some oral pharyngeal discomfort there is some redness but no actual significant desquamation in her mouth at this time.  She has skin breakdown on her bottom and was documented to have had significant diarrhea a few days ago which she states is feeling a little better.  She was on methotrexate and apparently continues to be so.  He is febrile, she did have hematuria but does not have any evidence of obvious urinary tract infection urine culture has been sent.  There is been a great delay in getting a CBC results back because apparently atypical findings are mandating collected.  I have started antipyretics.  IV fluid.  We are waiting for the information from her blood work.  She does have chronic appearing skin rash in a sun distributed area, she also has skin breakdown in her bottom likely consistent with recent diarrhea.  Does appear somewhat dehydrated with a renal function is reassuring.  Patient overall however does appear to be quite ill and we will see what her CBC shows I do not see any focal area of infection although certainly she could have infection from the skin breakdown on her rectum.  I do not believe this to be necessarily consistent with Stevens-Johnson's at this time given the chronicity of any of these rashes.  We are waiting for the CBC to see if she is neutropenic or otherwise possibly having side effects from her methotrexate treatment.  ----------------------------------------- 5:50 PM on 02/16/2019 -----------------------------------------  Patient has a very low white count, we are giving her broad-spectrum antibiotics because of her fever and low white count, I am calling hematology/oncology.Marland Kitchen  ----------------------------------------- 6:01 PM on 02/16/2019 -----------------------------------------  I talked to Dr. Janese Banks, she feels this is likely 2nd to mtx. She agrees with mgt and admission and abx. She wuold like a platelet count using heparin, which  I am talking to the lab about. I also talked to dr. Martie Lee, who asked for smear review, the lab report no schistocytes.    ____________________________________________   FINAL CLINICAL IMPRESSION(S) / ED DIAGNOSES  Final diagnoses:  Fever      This chart was dictated using voice recognition software.  Despite best efforts to proofread,  errors can occur which can change meaning.      Schuyler Amor, MD 02/16/19 1743    Schuyler Amor, MD 02/16/19 1750    Schuyler Amor, MD 02/16/19 Grier Mitts    Schuyler Amor, MD 02/16/19 [phone]

## 2019-02-16 NOTE — Progress Notes (Signed)
I have reviewed patients outside records and labs from today. Patient was on 15 mg weekly methotrexate for RA. Last cbc in march 2020 was normal. Now patient presents with confusion, nausea and abdominal pain. Labs show pancytopenia and elevated bilirubin. This is highly concerning for methotrexate toxicity.  1. Hold methotrexate. Methotrexate levels can be sent out. But typically levels can go down in 24 hours and does not rule out toxicity  2. Given fever, pancytopenia- please start her on leucovorin 5 to 10 mg IV Q12. Ok to give neupogen for neutropenic fever and management of neutropenic fever with broad spectrum antibiotics.   3. Elevated bilirubin- likely hepatotoxicity from MTX.  check RUQ USG, fractionated bilirubin, ldh, haptoglobin and reticulocyte count  4. Consult rheumatology as this patient is being managed by Rheumatology [facility] for RA and was started on MTX by them.   I will see the patient in AM  Dr. Randa Evens, MD, MPH [facility] [phone] 02/16/2019 6:25 PM

## 2019-02-16 NOTE — ED Notes (Signed)
Report called to 1c 

## 2019-02-16 NOTE — ED Triage Notes (Signed)
Pt arrived via POV with reports of sore throat x 1 week, vomiting, blood in urine and rash related to psoriatic arthritis.  Pt seen by PCP on 6/15, was dx with oral thrush and hypersentivity reaction from medications due to being out in the sun.  Pt states her mouth has been swelling and states she has had difficulty swallowing pills and when eating and told PCP the other day she may need her esophagus stretched.

## 2019-02-16 NOTE — H&P (Addendum)
[facility] [place] [facility]   PATIENT NAME: Claudia ShorterLelia Case    MR#:  [id number]  DATE OF BIRTH:  05-27-1941  DATE OF ADMISSION:  02/16/2019  PRIMARY CARE PHYSICIAN: Dr Lenon OmsSarah Gauger  REQUESTING/REFERRING PHYSICIAN: Dr Ileana RoupJames McShane  CHIEF COMPLAINT:   Chief Complaint  Patient presents with  . Hematuria  . Emesis    HISTORY OF PRESENT ILLNESS:  Claudia ShorterLelia Case  is a 78 y.o. female with a known history of rheumatoid arthritis on methotrexate.  She presents to the hospital feeling like she was going to die.  She states that her lips are burning and she wanted to get checked out.  Patient stated she had some nausea and vomiting.  She seemed a little confused and was not the greatest historian.  As per her sister, patient had diarrhea and been lying in bed.  In the ER, the patient was found to have a very low white blood cell count and a fever and a low platelet count.  The ER physician spoke with the laboratory team about the peripheral smear showing no schistocytes. I do not see that report yet in the computer.  Hospitalist services were contacted for further evaluation.  PAST MEDICAL HISTORY:   Past Medical History:  Diagnosis Date  . Arthritis   . GERD (gastroesophageal reflux disease)   . Hyperlipidemia   . Hypertension   . Psoriatic arthritis (HCC)     PAST SURGICAL HISTORY:   Past Surgical History:  Procedure Laterality Date  . NO PAST SURGERIES      SOCIAL HISTORY:   Social History   Tobacco Use  . Smoking status: Never Smoker  . Smokeless tobacco: Never Used  Substance Use Topics  . Alcohol use: Not on file    FAMILY HISTORY:   Family History  Problem Relation Age of Onset  . Breast cancer Sister 2070  . CAD Mother   . Bone cancer Father     DRUG ALLERGIES:  No Known Allergies  REVIEW OF SYSTEMS:  CONSTITUTIONAL: Positive for fever.  Positive for fatigue and weakness.  EYES: Wears glasses EARS, NOSE, AND THROAT: No tinnitus  or ear pain.  Positive for sore throat and sore lips.  Occasional dysphasia to pills.  Occasional runny nose. RESPIRATORY: No cough, shortness of breath, wheezing or hemoptysis.  CARDIOVASCULAR: No chest pain, orthopnea, edema.  GASTROINTESTINAL: No nausea, vomiting, diarrhea or abdominal pain. No blood in bowel movements GENITOURINARY: No dysuria, hematuria.  ENDOCRINE: No polyuria, nocturia,  HEMATOLOGY: No anemia, positive for easy bruising or bleeding SKIN: Bruising on arms and reddish spots on legs MUSCULOSKELETAL: No joint pain or arthritis.   NEUROLOGIC: No tingling, numbness, weakness.  PSYCHIATRY: No anxiety or depression.   MEDICATIONS AT HOME:   Prior to Admission medications   Not on File   Medication reconciliation still undergoing.  Patient takes lovastatin 20 mg daily, Zyrtec 10 mg daily, omeprazole 40 mg daily, nystatin swish and swallow, folic acid and methotrexate 6 tablets once a week.  VITAL SIGNS:  Blood pressure (!) 116/47, pulse (!) 101, temperature (!) 100.6 F (38.1 C), temperature source Oral, resp. rate 20, height 5' 4.5" (1.638 m), weight 60.8 kg, SpO2 100 %.  PHYSICAL EXAMINATION:  GENERAL:  78 y.o.-year-old patient lying in the bed with no acute distress.  EYES: Pupils equal, round, reactive to light and accommodation. No scleral icterus. Extraocular muscles intact.  HEENT: Head atraumatic, normocephalic. Oropharynx with thrush and dried blood around the gums and lips.  NECK:  Supple, no jugular venous distention. No thyroid enlargement, no tenderness.  LUNGS: Normal breath sounds bilaterally, no wheezing, rales,rhonchi or crepitation. No use of accessory muscles of respiration.  CARDIOVASCULAR: S1, S2 tachycardic. No murmurs, rubs, or gallops.  ABDOMEN: Soft, nontender, nondistended. Bowel sounds present. No organomegaly or mass.  EXTREMITIES: No pedal edema, cyanosis, or clubbing.  NEUROLOGIC: Cranial nerves II through XII are intact. Muscle strength 5/5  in all extremities. Sensation intact. Gait not checked.  PSYCHIATRIC: The patient is alert and answers some questions appropriately.  SKIN: Bruising bilateral arms.  Petechiae and bruising bilateral legs.  Redness in the sacral area.  LABORATORY PANEL:   CBC Recent Labs  Lab 02/16/19 1642 02/16/19 1730  WBC 0.4*  --   HGB 9.8*  --   HCT 27.5*  --   PLT PLATELET CLUMPS NOTED ON SMEAR, UNABLE TO ESTIMATE 20*   ------------------------------------------------------------------------------------------------------------------  Chemistries  Recent Labs  Lab 02/16/19 1514  NA 127*  K 4.0  CL 93*  CO2 21*  GLUCOSE 179*  BUN 25*  CREATININE 0.77  CALCIUM 7.7*  AST 21  ALT 25  ALKPHOS 45  BILITOT 7.7*   ------------------------------------------------------------------------------------------------------------------    IMPRESSION AND PLAN:   1.  Neutropenic fever.  Follow-up blood cultures.  Empiric cefepime.  Since the patient also has thrush I will give empiric Diflucan.  Follow temperature curve. 2.  Pancytopenia.  Likely reaction from methotrexate.  Leucovorin IV every 12 hours.  Will also give Granix to elevate the white count.  Oncology consultation.  ER physician spoke with the lab and reported no schistocytes seen on the smear.  Continue to watch platelet count closely.  May end up needing a platelet transfusion.  Oncology recommended rheumatology consultation but they are not available on the weekends. 3.  Jaundice with elevated bilirubin.  Likely also reaction from methotrexate.  Right upper quadrant sonogram still pending at this point.  Depending on results may need further testing. 4.  Hyponatremia.  Gentle IV fluids with normal saline 5.  Thrush.  IV Diflucan. 6.  Confusion.  CT scan of the head done but results still pending. 7.  Reported diarrhea send off stool studies 8.  Hyperlipidemia unspecified.  Hold statin with elevated bilirubin 9.  GERD.  Hold PPI 10.   Stage I decubiti on the sacral area with just redness on the skin   All the records are reviewed and Case discussed with ED provider. Management plans discussed with the patient, family and they are in agreement.  CODE STATUS: Full code  TOTAL TIME TAKING CARE OF THIS PATIENT: 50 minutes, including acp time.    Loletha Grayer M.D on 02/16/2019 at 7:34 PM  Between 7am to 6pm - Pager - [phone]  After 6pm call admission pager [phone]  [facility] Office  [phone]  CC: Primary care physician; Gaetano Net

## 2019-02-16 NOTE — ED Notes (Signed)
Pt in ct 

## 2019-02-16 NOTE — Progress Notes (Signed)
Patient ID: Claudia Case, female   DOB: 17-Sep-1940, 78 y.o.   MRN: [id number]  ACP note  Spoke with patient at the bedside and patient's sister on the phone secondary to the hospitals no visitor policy.  Patient came in because she thought she was going to die.  She states that her lips are burning and she had some nausea vomiting.  Patient also had some confusion.  They reported diarrhea.  Patient was found to have pancytopenia and elevated bilirubin.  The patient takes methotrexate at home.  Patient is a full code.  Plan.  For neutropenic fever aggressive antibiotics given.  For pancytopenia and methotrexate reaction leucovorin was ordered.  Patient will also be given Granix.  Follow-up counts closely.  If any bleeding can give a platelet transfusion.  Time spent on ACP discussion 17 minutes Dr Loletha Grayer

## 2019-02-16 NOTE — ED Notes (Signed)
Pt states she is not able to swallow the tylenol tabs at this time.

## 2019-02-17 ENCOUNTER — Inpatient Hospital Stay: Payer: Self-pay

## 2019-02-17 LAB — BLOOD CULTURE ID PANEL (REFLEXED)

## 2019-02-17 LAB — CBC
HCT: 20.7 % — ABNORMAL LOW (ref 36.0–46.0)
HCT: 22.6 % — ABNORMAL LOW (ref 36.0–46.0)
Hemoglobin: 7.4 g/dL — ABNORMAL LOW (ref 12.0–15.0)
Hemoglobin: 7.9 g/dL — ABNORMAL LOW (ref 12.0–15.0)
MCH: 34.9 pg — ABNORMAL HIGH (ref 26.0–34.0)
MCH: 35.6 pg — ABNORMAL HIGH (ref 26.0–34.0)
MCHC: 35.7 g/dL (ref 30.0–36.0)
MCHC: 35 g/dL (ref 30.0–36.0)
MCV: 97.6 fL (ref 80.0–100.0)
MCV: 101.8 fL — ABNORMAL HIGH (ref 80.0–100.0)
Platelets: 11 10*3/uL — CL (ref 150–400)
Platelets: 10 10*3/uL — CL (ref 150–400)
RBC: 2.12 MIL/uL — ABNORMAL LOW (ref 3.87–5.11)
RBC: 2.22 MIL/uL — ABNORMAL LOW (ref 3.87–5.11)
RDW: 14.8 % (ref 11.5–15.5)
RDW: 14.8 % (ref 11.5–15.5)
WBC: 0.3 10*3/uL — CL (ref 4.0–10.5)
WBC: 0.5 10*3/uL — CL (ref 4.0–10.5)
nRBC: 0 % (ref 0.0–0.2)
nRBC: 0 % (ref 0.0–0.2)

## 2019-02-17 LAB — BASIC METABOLIC PANEL
Anion gap: 10 (ref 5–15)
BUN: 23 mg/dL (ref 8–23)
CO2: 19 mmol/L — ABNORMAL LOW (ref 22–32)
Calcium: 7 mg/dL — ABNORMAL LOW (ref 8.9–10.3)
Chloride: 100 mmol/L (ref 98–111)
Creatinine, Ser: 0.64 mg/dL (ref 0.44–1.00)
GFR calc Af Amer: >60 mL/min (ref >60)
GFR calc non Af Amer: >60 mL/min (ref >60)
Glucose, Bld: 143 mg/dL — ABNORMAL HIGH (ref 70–99)
Potassium: 3.5 mmol/L (ref 3.5–5.1)
Sodium: 129 mmol/L — ABNORMAL LOW (ref 135–145)

## 2019-02-17 LAB — ABO/RH: ABO/RH(D): A POS

## 2019-02-17 MED ORDER — SODIUM CHLORIDE 0.9% IV SOLUTION
Freq: Once | INTRAVENOUS | Status: AC
  Administered 2019-02-17: 21:00:00 via INTRAVENOUS

## 2019-02-17 MED ORDER — SODIUM CHLORIDE 0.9 % IV BOLUS
250.0000 mL | Freq: Once | INTRAVENOUS | Status: AC
  Administered 2019-02-17: 250 mL via INTRAVENOUS

## 2019-02-17 MED ORDER — VANCOMYCIN HCL 1.25 G IV SOLR
1250.0000 mg | Freq: Once | INTRAVENOUS | Status: DC
  Filled 2019-02-17: qty 1250

## 2019-02-17 NOTE — Consult Note (Signed)
Hematology/Oncology Consult note [facility] Telephone:([phone] [phone]  Patient Care Team: Sherrin Daisy, MD as PCP - General (Family Medicine)   Name of the patient: Claudia Case  [id number]  August 11, 1941    Reason for consult: pancytopenia   Requesting physician: Dr. Burlene Arnt  Date of visit:: 02/17/2019    History of presenting illness- Patient is a 78 year old female with a past medical history significant for seronegative rheumatoid arthritis among other medical problems.  She follows up with [facility] clinic rheumatology.  Patient was initially on Plaquenil and then was switched to methotrexate which she takes 15 mg once a week.  Patient reports that her last dose was on 02/15/2019.  Patient came to the hospital with symptoms of fatigue, confusion as well as burning in her mouth and nausea.On admission to the hospital patient was noted to have a white count of 0.4, H&H of 9.8/27.5 and a platelet count of 20.  Lactic acid levels were elevated at 2.4.  BMP was normal but total bilirubin was elevated at 7.7 and fractionated bilirubin showed predominantly direct bilirubinemia with a direct hemoglobin of 4.3 and indirect bilirubin of 2.6.  Reticulocyte count was low at 0.2 and LDH was normal.  COVID testing was negative.   Pain scale- 4   Review of systems- Review of Systems  Constitutional: Positive for malaise/fatigue. Negative for chills, fever and weight loss.  HENT: Negative for congestion, ear discharge and nosebleeds.        Burning in her mouth  Eyes: Negative for blurred vision.  Respiratory: Negative for cough, hemoptysis, sputum production, shortness of breath and wheezing.   Cardiovascular: Negative for chest pain, palpitations, orthopnea and claudication.  Gastrointestinal: Positive for nausea. Negative for abdominal pain, blood in stool, constipation, diarrhea, heartburn, melena and vomiting.  Genitourinary: Negative for dysuria,  flank pain, frequency, hematuria and urgency.  Musculoskeletal: Negative for back pain, joint pain and myalgias.  Skin: Negative for rash.  Neurological: Negative for dizziness, tingling, focal weakness, seizures, weakness and headaches.  Endo/Heme/Allergies: Does not bruise/bleed easily.  Psychiatric/Behavioral: Negative for depression and suicidal ideas. The patient does not have insomnia.     No Known Allergies  Patient Active Problem List   Diagnosis Date Noted   Neutropenic fever ([place]) 02/16/2019     Past Medical History:  Diagnosis Date   Arthritis    GERD (gastroesophageal reflux disease)    Hyperlipidemia    Hypertension    Psoriatic arthritis ([place])      Past Surgical History:  Procedure Laterality Date   NO PAST SURGERIES      Social History   Socioeconomic History   Marital status: Widowed    Spouse name: Not on file   Number of children: Not on file   Years of education: Not on file   Highest education level: Not on file  Occupational History   Not on file  Social Needs   Financial resource strain: Not hard at all   Food insecurity    Worry: Never true    Inability: Never true   Transportation needs    Medical: No    Non-medical: No  Tobacco Use   Smoking status: Never Smoker   Smokeless tobacco: Never Used  Substance and Sexual Activity   Alcohol use: Not Currently   Drug use: Never   Sexual activity: Not Currently  Lifestyle   Physical activity    Days per week: 2 days    Minutes per session: 20 min  Stress: Not at all  Relationships   Social connections    Talks on phone: Once a week    Gets together: Once a week    Attends religious service: 1 to 4 times per year    Active member of club or organization: No    Attends meetings of clubs or organizations: Never    Relationship status: Widowed   Intimate partner violence    Fear of current or ex partner: No    Emotionally abused: No    Physically abused: No      Forced sexual activity: No  Other Topics Concern   Not on file  Social History Narrative   Lives alone     Family History  Problem Relation Age of Onset   Breast cancer Sister 30   CAD Mother    Bone cancer Father      Current Facility-Administered Medications:    0.9 %  sodium chloride infusion (Manually program via Guardrails IV Fluids), , Intravenous, Once, [name], MD   0.9 %  sodium chloride infusion, , Intravenous, Continuous, Wieting, Richard, MD, Last Rate: 40 mL/hr at 02/17/19 0458   acetaminophen (TYLENOL) tablet 650 mg, 650 mg, Oral, Q6H PRN **OR** acetaminophen (TYLENOL) suppository 650 mg, 650 mg, Rectal, Q6H PRN, Leslye Peer, Richard, MD   acetaminophen (TYLENOL) tablet 650 mg, 650 mg, Oral, Once PRN, Schuyler Amor, MD   ceFEPIme (MAXIPIME) 2 g in sodium chloride 0.9 % 100 mL IVPB, 2 g, Intravenous, Q12H, Shanlever, Charles M, RPH   fluconazole (DIFLUCAN) IVPB 100 mg, 100 mg, Intravenous, Q24H, Wieting, Richard, MD, Last Rate: 50 mL/hr at 02/16/19 2301, 100 mg at 02/16/19 2301   leucovorin injection 10 mg, 10 mg, Intravenous, Q12H, Wieting, Richard, MD, 10 mg at 02/17/19 1037   ondansetron (ZOFRAN) tablet 4 mg, 4 mg, Oral, Q6H PRN **OR** ondansetron (ZOFRAN) injection 4 mg, 4 mg, Intravenous, Q6H PRN, Wieting, Richard, MD   sodium chloride flush (NS) 0.9 % injection 3 mL, 3 mL, Intravenous, Once, Schuyler Amor, MD   Physical exam:  Vitals:   02/16/19 2158 02/17/19 0232 02/17/19 0346 02/17/19 0454  BP: (!) 97/45 (!) 98/44 (!) 93/41 (!) 85/48  Pulse: (!) 102 (!) 103 (!) 101 95  Resp:   18 15  Temp: (!) 102.5 F (39.2 C) (!) 101.7 F (38.7 C) 100.2 F (37.9 C) 98.3 F (36.8 C)  TempSrc: Oral Oral Oral Oral  SpO2: 99% 98% 97% 98%  Weight: 130 lb 3.2 oz (59.1 kg)     Height: '5\' 4"'  (1.626 m)      Physical Exam Constitutional:      Comments: Thin frail fatigued elderly woman  HENT:     Head: Normocephalic and atraumatic.      Mouth/Throat:     Comments: Severe mucositis Eyes:     Pupils: Pupils are equal, round, and reactive to light.  Neck:     Musculoskeletal: Normal range of motion.  Cardiovascular:     Rate and Rhythm: Normal rate and regular rhythm.     Heart sounds: Normal heart sounds.  Pulmonary:     Effort: Pulmonary effort is normal.     Breath sounds: Normal breath sounds.  Abdominal:     General: Bowel sounds are normal. There is no distension.     Palpations: Abdomen is soft.     Tenderness: There is no abdominal tenderness.  Skin:    General: Skin is warm and dry.     Comments: Erythematous macular  large irregular patch noted over left forearm. Bruising over b/l lower extremities  Neurological:     Mental Status: She is alert and oriented to person, place, and time.        CMP Latest Ref Rng & Units 02/17/2019  Glucose 70 - 99 mg/dL 143(H)  BUN 8 - 23 mg/dL 23  Creatinine 0.44 - 1.00 mg/dL 0.64  Sodium 135 - 145 mmol/L 129(L)  Potassium 3.5 - 5.1 mmol/L 3.5  Chloride 98 - 111 mmol/L 100  CO2 22 - 32 mmol/L 19(L)  Calcium 8.9 - 10.3 mg/dL 7.0(L)  Total Protein 6.5 - 8.1 g/dL -  Total Bilirubin 0.3 - 1.2 mg/dL -  Alkaline Phos 38 - 126 U/L -  AST 15 - 41 U/L -  ALT 0 - 44 U/L -   CBC Latest Ref Rng & Units 02/17/2019  WBC 4.0 - 10.5 K/uL 0.5(LL)  Hemoglobin 12.0 - 15.0 g/dL 7.9(L)  Hematocrit 36.0 - 46.0 % 22.6(L)  Platelets 150 - 400 K/uL 10(LL)    '@IMAGES' @  Ct Head Wo Contrast  Result Date: 02/16/2019 CLINICAL DATA:  Acute confusion. EXAM: CT HEAD WITHOUT CONTRAST TECHNIQUE: Contiguous axial images were obtained from the base of the skull through the vertex without intravenous contrast. COMPARISON:  Remote head CT and brain MRI December 2010 FINDINGS: Brain: No evidence of acute infarction, hemorrhage, hydrocephalus, extra-axial collection or mass lesion/mass effect. Mild chronic small vessel ischemia. Remote lacunar infarct in the periventricular right frontal lobe.  Vascular: Atherosclerosis of skullbase vasculature without hyperdense vessel or abnormal calcification. Skull: No fracture or focal lesion. Sinuses/Orbits: Paranasal sinuses and mastoid air cells are clear. The visualized orbits are unremarkable. Other: None. IMPRESSION: No acute intracranial abnormality. Electronically Signed   By: Keith Rake M.D.   On: 02/16/2019 19:07   Dg Chest Port 1 View  Result Date: 02/16/2019 CLINICAL DATA:  Sore throat with vomiting EXAM: PORTABLE CHEST 1 VIEW COMPARISON:  CT 08/03/2009, radiograph 08/01/2009 FINDINGS: No focal airspace opacity or pleural effusion. Mild cardiomegaly. Moderate to large hiatal hernia. IMPRESSION: No active disease.  Hiatal hernia. Electronically Signed   By: Donavan Foil M.D.   On: 02/16/2019 16:51   US Abdomen Limited Ruq  Result Date: 02/16/2019 CLINICAL DATA:  78 year old female with elevated bilirubin. EXAM: ULTRASOUND ABDOMEN LIMITED RIGHT UPPER QUADRANT COMPARISON:  08/03/2009 CT FINDINGS: Gallbladder: Gallbladder sludge is noted. There is no evidence of cholelithiasis or acute cholecystitis. Common bile duct: Diameter: 3.9 mm. No evidence of intrahepatic or extrahepatic biliary dilatation. Liver: No focal lesion identified. Within normal limits in parenchymal echogenicity. Portal vein is patent on color Doppler imaging with normal direction of blood flow towards the liver. IMPRESSION: Gallbladder sludge, otherwise unremarkable RIGHT UPPER quadrant abdominal ultrasound. No evidence of biliary dilatation. Electronically Signed   By: Margarette Canada M.D.   On: 02/16/2019 19:27    Assessment and plan- Patient is a 78 y.o. female with seronegative RA admitted with severe pancytopenia and neutropenic fever  1. Pancytopenia: this along with severe mucositis and hyperbilirubinemia highly suggestive of methotrexate toxicity. She may have some degree of myelosuppression from ongoing sepsis as well. Continue broad spectrum antibiotics for  neutropenic fever. Ok to give daily neupogen until [place] >1. Transfuse for H/H <7/21 and platelet count <10 or clinical bleeding.  I have personally reviewed her peripheral smear and there are no schistocytes. Rbc's are decreased in number but significantly low numbers of platelets and WBCs.   Pattern of hyperbilirbubinemia predominantly direct also suggestive of  hepatotoxicity from MTX. RUQ unremarkable. Monitor daily LFTs  Recommend giving leucovorin 5 to 10 mg Q12 hours until pancytopenia shows signs of improvement.  Have rheumatology on board in am. Last dose of MTX was on 02/15/19.  2. Severe mucositis- pain management and magic mouthwash may help  No indication for bone marrow biopsy at this time. Pancytopenia will likely take few weeks to resolve.     Visit Diagnosis 1. Fever   2. Bilirubinemia     Dr. Randa Evens, MD, MPH [facility] [phone] 02/17/2019 3:46 PM

## 2019-02-17 NOTE — Plan of Care (Signed)
  Problem: Education: Goal: Knowledge of General Education information will improve Description: Including pain rating scale, medication(s)/side effects and non-pharmacologic comfort measures Outcome: Progressing   Problem: Health Behavior/Discharge Planning: Goal: Ability to manage health-related needs will improve Outcome: Progressing   Problem: Clinical Measurements: Goal: Ability to maintain clinical measurements within normal limits will improve Outcome: Progressing Goal: Diagnostic test results will improve Outcome: Progressing   Problem: Safety: Goal: Ability to remain free from injury will improve Outcome: Progressing   Problem: Skin Integrity: Goal: Risk for impaired skin integrity will decrease Outcome: Progressing   

## 2019-02-17 NOTE — Progress Notes (Signed)
Shireen Quan, RN concerning PICC order. Patient presently has a PIV that is function and sufficient for ordered IV medications. She was instructed that it could be placed tomorrow. Time restraints prevent it being placed today. Colletta Maryland, RN is to put in a consult for another access until PICC can be placed.

## 2019-02-17 NOTE — Progress Notes (Signed)
[facility] NAME: Claudia Case    MR#:  [id number]  DATE OF BIRTH:  07-22-1941  SUBJECTIVE:  CHIEF COMPLAINT:   Chief Complaint  Patient presents with  . Hematuria  . Emesis   Brought with confusion and noted to have pancytopenia and some fever.  Today had some bleed from skin breakdown.  No new complaints.  REVIEW OF SYSTEMS:  CONSTITUTIONAL: No fever, have fatigue or weakness.  EYES: No blurred or double vision.  EARS, NOSE, AND THROAT: No tinnitus or ear pain.  RESPIRATORY: No cough, shortness of breath, wheezing or hemoptysis.  CARDIOVASCULAR: No chest pain, orthopnea, edema.  GASTROINTESTINAL: No nausea, vomiting, diarrhea or abdominal pain.  GENITOURINARY: No dysuria, hematuria.  ENDOCRINE: No polyuria, nocturia,  HEMATOLOGY: No anemia, easy bruising or bleeding SKIN: No rash or lesion. MUSCULOSKELETAL: No joint pain or arthritis.   NEUROLOGIC: No tingling, numbness, have generalized weakness.  PSYCHIATRY: No anxiety or depression.   ROS  DRUG ALLERGIES:  No Known Allergies  VITALS:  Blood pressure (!) 85/48, pulse 95, temperature 98.3 F (36.8 C), temperature source Oral, resp. rate 15, height 5\' 4"  (1.626 m), weight 59.1 kg, SpO2 98 %.  PHYSICAL EXAMINATION:  GENERAL:  78 y.o.-year-old patient lying in the bed with no acute distress.  EYES: Pupils equal, round, reactive to light and accommodation. No scleral icterus. Extraocular muscles intact.  HEENT: Head atraumatic, normocephalic. Oropharynx and nasopharynx clear.  NECK:  Supple, no jugular venous distention. No thyroid enlargement, no tenderness.  LUNGS: Normal breath sounds bilaterally, no wheezing, rales,rhonchi or crepitation. No use of accessory muscles of respiration.  CARDIOVASCULAR: S1, S2 normal. No murmurs, rubs, or gallops.  ABDOMEN: Soft, nontender, nondistended. Bowel sounds present. No organomegaly or mass.  EXTREMITIES: No pedal edema, cyanosis,  or clubbing.  NEUROLOGIC: Cranial nerves II through XII are intact. Muscle strength 4/5 in all extremities. Sensation intact. Gait not checked.  Generalized PSYCHIATRIC: The patient is alert and oriented x 3.  SKIN: No obvious rash, lesion, or ulcer.  Ecchymosis on the skin with some breakdown and signs of recent bleed on left forearm.  Physical Exam LABORATORY PANEL:   CBC Recent Labs  Lab 02/17/19 1308  WBC 0.5*  HGB 7.9*  HCT 22.6*  PLT 10*   ------------------------------------------------------------------------------------------------------------------  Chemistries  Recent Labs  Lab 02/16/19 1514 02/16/19 1920 02/17/19 0407  NA 127*  --  129*  K 4.0  --  3.5  CL 93*  --  100  CO2 21*  --  19*  GLUCOSE 179*  --  143*  BUN 25*  --  23  CREATININE 0.77  --  0.64  CALCIUM 7.7*  --  7.0*  AST 21  --   --   ALT 25  --   --   ALKPHOS 45  --   --   BILITOT 7.7* 6.9*  --    ------------------------------------------------------------------------------------------------------------------  Cardiac Enzymes No results for input(s): TROPONINI in the last 168 hours. ------------------------------------------------------------------------------------------------------------------  RADIOLOGY:  Ct Head Wo Contrast  Result Date: 02/16/2019 CLINICAL DATA:  Acute confusion. EXAM: CT HEAD WITHOUT CONTRAST TECHNIQUE: Contiguous axial images were obtained from the base of the skull through the vertex without intravenous contrast. COMPARISON:  Remote head CT and brain MRI December 2010 FINDINGS: Brain: No evidence of acute infarction, hemorrhage, hydrocephalus, extra-axial collection or mass lesion/mass effect. Mild chronic small vessel ischemia. Remote lacunar infarct in the periventricular right frontal lobe. Vascular: Atherosclerosis of skullbase  vasculature without hyperdense vessel or abnormal calcification. Skull: No fracture or focal lesion. Sinuses/Orbits: Paranasal sinuses and  mastoid air cells are clear. The visualized orbits are unremarkable. Other: None. IMPRESSION: No acute intracranial abnormality. Electronically Signed   By: Narda RutherfordMelanie  Sanford M.D.   On: 02/16/2019 19:07   Dg Chest Port 1 View  Result Date: 02/16/2019 CLINICAL DATA:  Sore throat with vomiting EXAM: PORTABLE CHEST 1 VIEW COMPARISON:  CT 08/03/2009, radiograph 08/01/2009 FINDINGS: No focal airspace opacity or pleural effusion. Mild cardiomegaly. Moderate to large hiatal hernia. IMPRESSION: No active disease.  Hiatal hernia. Electronically Signed   By: Jasmine PangKim  Fujinaga M.D.   On: 02/16/2019 16:51   [place] Abdomen Limited Ruq  Result Date: 02/16/2019 CLINICAL DATA:  78 year old female with elevated bilirubin. EXAM: ULTRASOUND ABDOMEN LIMITED RIGHT UPPER QUADRANT COMPARISON:  08/03/2009 CT FINDINGS: Gallbladder: Gallbladder sludge is noted. There is no evidence of cholelithiasis or acute cholecystitis. Common bile duct: Diameter: 3.9 mm. No evidence of intrahepatic or extrahepatic biliary dilatation. Liver: No focal lesion identified. Within normal limits in parenchymal echogenicity. Portal vein is patent on color Doppler imaging with normal direction of blood flow towards the liver. IMPRESSION: Gallbladder sludge, otherwise unremarkable RIGHT UPPER quadrant abdominal ultrasound. No evidence of biliary dilatation. Electronically Signed   By: Harmon PierJeffrey  Hu M.D.   On: 02/16/2019 19:27    ASSESSMENT AND PLAN:   Active Problems:   Neutropenic fever (HCC)   1.  Neutropenic fever.  Follow-up blood cultures.  Empiric cefepime.  Since the patient also has thrush ,give empiric Diflucan.  Follow temperature curve. no fever. 2.  Pancytopenia.  Likely reaction from methotrexate.  Leucovorin IV every 12 hours.  also give Granix to elevate the white count.  Oncology consultation appreciated.  ER physician spoke with the lab and reported no schistocytes seen on the smear.  Continue to watch platelet count closely.  Oncology  recommended rheumatology consultation but they are not available on the weekends. Her platelet count dropped to 10,000 I will give 1 unit transfusion today. 3.  Jaundice with elevated bilirubin.  Likely also reaction from methotrexate.  Right upper quadrant sonogram - no acute findings.  Depending on results may need further testing. 4.  Hyponatremia.  Gentle IV fluids with normal saline-improved some.  This could be the reason for confusion. 5.  Thrush.  IV Diflucan. 6.  Confusion.  CT scan of the head done- no active findings / bleed. Improved now. 7.  Reported diarrhea send off stool studies 8.  Hyperlipidemia unspecified.  Hold statin with elevated bilirubin 9.  GERD.  Hold PPI 10.  Stage I decubiti on the sacral area with just redness on the skin   All the records are reviewed and case discussed with Care Management/Social Workerr. Management plans discussed with the patient, family and they are in agreement.  CODE STATUS: Full code.  TOTAL TIME TAKING CARE OF THIS PATIENT: *35 minutes.   I discussed the case with oncologist.  I tried calling patient's sister but could not speak to her on the phone  POSSIBLE D/C IN 2-3 DAYS, DEPENDING ON CLINICAL CONDITION.   Altamese DillingVaibhavkumar Rhemi Balbach M.D on 02/17/2019   Between 7am to 6pm - Pager - [phone]  After 6pm go to [email or web address] - password [email or web address]  [facility]  Office  [phone]  CC: Primary care physician; Sula RumpleVirk, Charanjit, MD  Note: This dictation was prepared with Dragon dictation along with smaller phrase technology. Any transcriptional errors that result from this process are unintentional.

## 2019-02-17 NOTE — Progress Notes (Addendum)
PHARMACY - PHYSICIAN COMMUNICATION CRITICAL VALUE ALERT - BLOOD CULTURE IDENTIFICATION (BCID)  Claudia Case is an 78 y.o. female who presented to [facility] on 02/16/2019 with a chief complaint of febrile neutropenia  Assessment:  78 yo female admitted with neutropenic fever   Name of physician (or Provider) Contacted: Dr Anselm Jungling  Current antibiotics: cefepime  Changes to prescribed antibiotics recommended:  Messaged MD with BCID 1 of 4 bottles with staph species (no methicillin resistance).  Per MD continue with cefepime.   Results for orders placed or performed during the hospital encounter of 02/16/19  Blood Culture ID Panel (Reflexed) (Collected: 02/16/2019  4:40 PM)  Result Value Ref Range   Enterococcus species NOT DETECTED NOT DETECTED   Listeria monocytogenes NOT DETECTED NOT DETECTED   Staphylococcus species DETECTED (A) NOT DETECTED   Staphylococcus aureus (BCID) NOT DETECTED NOT DETECTED   Methicillin resistance NOT DETECTED NOT DETECTED   Streptococcus species NOT DETECTED NOT DETECTED   Streptococcus agalactiae NOT DETECTED NOT DETECTED   Streptococcus pneumoniae NOT DETECTED NOT DETECTED   Streptococcus pyogenes NOT DETECTED NOT DETECTED   Acinetobacter baumannii NOT DETECTED NOT DETECTED   Enterobacteriaceae species NOT DETECTED NOT DETECTED   Enterobacter cloacae complex NOT DETECTED NOT DETECTED   Escherichia coli NOT DETECTED NOT DETECTED   Klebsiella oxytoca NOT DETECTED NOT DETECTED   Klebsiella pneumoniae NOT DETECTED NOT DETECTED   Proteus species NOT DETECTED NOT DETECTED   Serratia marcescens NOT DETECTED NOT DETECTED   Haemophilus influenzae NOT DETECTED NOT DETECTED   Neisseria meningitidis NOT DETECTED NOT DETECTED   Pseudomonas aeruginosa NOT DETECTED NOT DETECTED   Candida albicans NOT DETECTED NOT DETECTED   Candida glabrata NOT DETECTED NOT DETECTED   Candida krusei NOT DETECTED NOT DETECTED   Candida parapsilosis NOT DETECTED NOT DETECTED   Candida tropicalis NOT DETECTED NOT DETECTED    [name] 02/17/2019  11:49 AM

## 2019-02-18 ENCOUNTER — Inpatient Hospital Stay: Payer: Medicare HMO

## 2019-02-18 DIAGNOSIS — R319 Hematuria, unspecified: Secondary | ICD-10-CM

## 2019-02-18 DIAGNOSIS — T451X5A Adverse effect of antineoplastic and immunosuppressive drugs, initial encounter: Secondary | ICD-10-CM

## 2019-02-18 DIAGNOSIS — R5081 Fever presenting with conditions classified elsewhere: Secondary | ICD-10-CM

## 2019-02-18 DIAGNOSIS — Z79899 Other long term (current) drug therapy: Secondary | ICD-10-CM

## 2019-02-18 DIAGNOSIS — R945 Abnormal results of liver function studies: Secondary | ICD-10-CM

## 2019-02-18 DIAGNOSIS — K121 Other forms of stomatitis: Secondary | ICD-10-CM

## 2019-02-18 DIAGNOSIS — D709 Neutropenia, unspecified: Secondary | ICD-10-CM

## 2019-02-18 DIAGNOSIS — D61811 Other drug-induced pancytopenia: Secondary | ICD-10-CM

## 2019-02-18 DIAGNOSIS — K1231 Oral mucositis (ulcerative) due to antineoplastic therapy: Secondary | ICD-10-CM

## 2019-02-18 DIAGNOSIS — M0600 Rheumatoid arthritis without rheumatoid factor, unspecified site: Secondary | ICD-10-CM

## 2019-02-18 DIAGNOSIS — L4050 Arthropathic psoriasis, unspecified: Secondary | ICD-10-CM

## 2019-02-18 LAB — COMPREHENSIVE METABOLIC PANEL
ALT: 22 U/L (ref 0–44)
AST: 24 U/L (ref 15–41)
Albumin: 2.1 g/dL — ABNORMAL LOW (ref 3.5–5.0)
Alkaline Phosphatase: 35 U/L — ABNORMAL LOW (ref 38–126)
Anion gap: 9 (ref 5–15)
BUN: 24 mg/dL — ABNORMAL HIGH (ref 8–23)
CO2: 19 mmol/L — ABNORMAL LOW (ref 22–32)
Calcium: 7.4 mg/dL — ABNORMAL LOW (ref 8.9–10.3)
Chloride: 110 mmol/L (ref 98–111)
Creatinine, Ser: 0.54 mg/dL (ref 0.44–1.00)
GFR calc Af Amer: >60 mL/min (ref >60)
GFR calc non Af Amer: >60 mL/min (ref >60)
Glucose, Bld: 106 mg/dL — ABNORMAL HIGH (ref 70–99)
Potassium: 3.3 mmol/L — ABNORMAL LOW (ref 3.5–5.1)
Sodium: 138 mmol/L (ref 135–145)
Total Bilirubin: 7.1 mg/dL — ABNORMAL HIGH (ref 0.3–1.2)
Total Protein: 4.8 g/dL — ABNORMAL LOW (ref 6.5–8.1)

## 2019-02-18 LAB — CBC
HCT: 21.8 % — ABNORMAL LOW (ref 36.0–46.0)
Hemoglobin: 7.8 g/dL — ABNORMAL LOW (ref 12.0–15.0)
MCH: 35.9 pg — ABNORMAL HIGH (ref 26.0–34.0)
MCHC: 35.8 g/dL (ref 30.0–36.0)
MCV: 100.5 fL — ABNORMAL HIGH (ref 80.0–100.0)
Platelets: 28 10*3/uL — CL (ref 150–400)
RBC: 2.17 MIL/uL — ABNORMAL LOW (ref 3.87–5.11)
RDW: 15 % (ref 11.5–15.5)
WBC: 0.9 10*3/uL — CL (ref 4.0–10.5)
nRBC: 0 % (ref 0.0–0.2)

## 2019-02-18 LAB — HAPTOGLOBIN: Haptoglobin: 188 mg/dL (ref 42–346)

## 2019-02-18 LAB — PREPARE PLATELET PHERESIS: Unit division: 0

## 2019-02-18 LAB — URINE CULTURE: Culture: NO GROWTH

## 2019-02-18 LAB — BPAM PLATELET PHERESIS
Blood Product Expiration Date: 202006232359
ISSUE DATE / TIME: 202006212058
Unit Type and Rh: 6200

## 2019-02-18 LAB — BILIRUBIN, DIRECT: Bilirubin, Direct: 4.5 mg/dL — ABNORMAL HIGH (ref 0.0–0.2)

## 2019-02-18 LAB — PATHOLOGIST SMEAR REVIEW

## 2019-02-18 MED ORDER — METRONIDAZOLE IN NACL 5-0.79 MG/ML-% IV SOLN
500.0000 mg | Freq: Two times a day (BID) | INTRAVENOUS | Status: AC
  Administered 2019-02-18 – 2019-02-21 (×7): 500 mg via INTRAVENOUS
  Filled 2019-02-18 (×7): qty 100

## 2019-02-18 MED ORDER — TAMSULOSIN HCL 0.4 MG PO CAPS
0.4000 mg | ORAL_CAPSULE | Freq: Every day | ORAL | Status: DC
  Administered 2019-02-19 – 2019-03-13 (×17): 0.4 mg via ORAL
  Filled 2019-02-18 (×16): qty 1

## 2019-02-18 MED ORDER — MICONAZOLE NITRATE POWD
Freq: Two times a day (BID) | Status: DC
  Filled 2019-02-18: qty 100

## 2019-02-18 MED ORDER — NYSTATIN 100000 UNIT/GM EX POWD
Freq: Two times a day (BID) | CUTANEOUS | Status: DC
  Administered 2019-02-18 – 2019-03-15 (×51): via TOPICAL
  Filled 2019-02-18 (×4): qty 15

## 2019-02-18 NOTE — Consult Note (Signed)
Reason for Consult: Rheumatoid arthritis  Referring Physician: Hospitalist  Tenna Delaine   HPI: 78 year old white female.  Was evaluated earlier this year for seronegative erosive arthritis predominantly in hands and wrist and feet.  She had a family history of rheumatoid.  She had a rash on both forearms.  Was seen by dermatology thought to be psoriasis.  She was initially treated with methotrexate for her joint pain and then switched to methotrexate.  She had previously been treated with methotrexate a year prior with concern of discoloration of the skin and.  She was given prescription for 6 methotrexate pills a week.  She took them 2 on Monday, 2 on Wednesday and 2 on Friday. Labs on 10/30/2018 showed normal transaminases and normal CBC. In the last week she developed weakness hands soreness in the mouth.  She was seen by primary care and thought to have thrush.  Was started on She had persistent weakness.  Present emergency room where she was pancytopenic and elevated bilirubin.  Febrile.  She was admitted.  Blood culture positive for staph.  Count 0.9 thousand.  Hemoglobin 7.8.  MCV 100.  Platelet count 28,000.  Her 102.  She has been on broad antibiotics.  She is received leucovorin and Neupogen.  No joint complaints today.  No shortness of breath.  No abdominal pain  PMH: Hypertension.  Reflux.  Osteoporosis.  Seronegative erosive arthritis.  Psoriasis.  SURGICAL HISTORY: No joint surgery  Family History: Sister has rheumatoid arthritis  Social History: No cigarettes or alcohol  Allergies: No Known Allergies  Medications:  Scheduled: . leucovorin  10 mg Intravenous Q12H  . sodium chloride flush  3 mL Intravenous Once  . tamsulosin  0.4 mg Oral Daily        ROS: No fever.  No abdominal pain.  No recent shortness of breath.   PHYSICAL EXAM: Blood pressure (!) 119/58, pulse 95, temperature 98.4 F (36.9 C), temperature source Oral, resp. rate 16, height 5\' 4"  (1.626 m),  weight 59.1 kg, SpO2 99 %. Pleasant female.  Seen sitting up in bed.  No acute distress.  Erythematous scaling rash both forearms in the neck and left anterior shin.  Sclera mildly icteric.  Oropharynx with diffuse mucositis.  Neck supple.  Clear chest.  No significant murmur.  No abdominal tenderness.  No visceromegaly.  1+ edema Skin skeletal: Mild estrangement cervical spine for shoulders move well.  No elbow nodules.  Right wrist is fused.  Right fifth finger contracture.  Thenar atrophy.  Decreased range of motion left wrist.  Second finger is fused at the PIP.  Decreased grip bilaterally.  Mild Deeks range of motion hips.  No knee effusions.  Decreased range of motion ankles.  Cock-up toes with decreased range of motion of the midfoot.   Assessment: Cytopenia.  Possibly combination of methotrexate.  (Patient was taking it 2 pills every other day) and sepsis Seronegative erosive arthritis Psoriasis Gram-positive cocci in the blood  Recommendations: Agree with leucovorin and biotics and Neupogen.  Consider abdominal ultrasound to look at liver and rule obstruction and elevated bilirubin No remittive agents for her arthritis or psoriasis at this time.  We will reassess at follow-up when blood counts and liver blood tests return to normal  Liliane Bade W 02/18/2019, 1:24 PM

## 2019-02-18 NOTE — Evaluation (Signed)
Physical Therapy Evaluation Patient Details Name: Claudia Case MRN: [id number] DOB: May 13, 1941 Today's Date: 02/18/2019   History of Present Illness  Patient is a 78 year old female that presents with weakness, mouth sores, pain in her hands, and nausea/vomiting. She was found to have pancytopenia, elevated bilirubin levels. She has a history of RA, which has been managed with methotrexate.  Clinical Impression  Patient is a relatively poor historian, but it appears she was living independently at home prior to this admission. She requires cuing for all mobility, and cga x1 for balance assistance during transfers and ambulation in part due to observed weakness and in part due to possible deficits in insight. She was able to ambulate household distance with a RW, and notes no steps to enter her house nor any steps in the house. She has had the assistance of family to complete grocery shopping, and is likely limited to a household ambulator currently. She would benefit from a RW and HHPT to improve her functional mobility and reduce risk of falling.     Follow Up Recommendations Home health PT    Equipment Recommendations  Rolling walker with 5" wheels    Recommendations for Other Services       Precautions / Restrictions Precautions Precautions: Fall Restrictions Weight Bearing Restrictions: No      Mobility  Bed Mobility Overal bed mobility: Needs Assistance Bed Mobility: Supine to Sit     Supine to sit: Min guard;Min assist     General bed mobility comments: Patient is able to initiate transfer, requires mild assistance with bringing her torso upright.  Transfers Overall transfer level: Needs assistance Equipment used: Rolling walker (2 wheeled) Transfers: Sit to/from Stand Sit to Stand: Supervision;Min guard         General transfer comment: Patient requires no physical assistance, she does require balance assistance due to posterior  lean.  Ambulation/Gait Ambulation/Gait assistance: Min guard Gait Distance (Feet): 165 Feet Assistive device: Rolling walker (2 wheeled) Gait Pattern/deviations: Step-through pattern;Decreased step length - right;Decreased step length - left   Gait velocity interpretation: <1.31 ft/sec, indicative of household ambulator General Gait Details: Patient required cuing for completing step through gait pattern, initially took limited step lengths. No other balance deficits observed afterwards.  Stairs            Wheelchair Mobility    Modified Rankin (Stroke Patients Only)       Balance Overall balance assessment: Mild deficits observed, not formally tested                                           Pertinent Vitals/Pain Pain Assessment: No/denies pain    Home Living Family/patient expects to be discharged to:: Private residence Living Arrangements: Alone Available Help at Discharge: Available PRN/intermittently Type of Home: House Home Access: Level entry     Home Layout: One level Home Equipment: None Additional Comments: Patient denies use of RW/SPC prior to this admission, it sounds like she was not driving any longer.    Prior Function Level of Independence: Independent         Comments: Per patient, one near fall while doing housework.     Hand Dominance        Extremity/Trunk Assessment   Upper Extremity Assessment Upper Extremity Assessment: Overall WFL for tasks assessed    Lower Extremity Assessment Lower Extremity Assessment: Overall WFL for  tasks assessed       Communication   Communication: No difficulties;Other (comment)(Patient noted to perseverate at times, mild confusion observed.)  Cognition Arousal/Alertness: Awake/alert Behavior During Therapy: WFL for tasks assessed/performed Overall Cognitive Status: Within Functional Limits for tasks assessed                                 General Comments:  Some intermittent episodes of confusion, patient generally aware she is in the hospital but does not seem to recall her past medical history significantly.      General Comments General comments (skin integrity, edema, etc.): Significant bruising and erythema on R forearm.    Exercises     Assessment/Plan    PT Assessment Patient needs continued PT services  PT Problem List Decreased strength;Decreased cognition;Decreased knowledge of use of DME;Decreased safety awareness;Decreased activity tolerance;Decreased balance;Decreased mobility       PT Treatment Interventions Gait training;DME instruction;Balance training;Neuromuscular re-education;Stair training;Functional mobility training;Patient/family education;Therapeutic activities;Therapeutic exercise    PT Goals (Current goals can be found in the Care Plan section)  Acute Rehab PT Goals Patient Stated Goal: To return home safely. PT Goal Formulation: With patient Time For Goal Achievement: 03/04/19 Potential to Achieve Goals: Good    Frequency Min 2X/week   Barriers to discharge Decreased caregiver support Patient would benefit from supervision/support due to possible cognitive decline    Co-evaluation               AM-PAC PT "6 Clicks" Mobility  Outcome Measure Help needed turning from your back to your side while in a flat bed without using bedrails?: A Little Help needed moving from lying on your back to sitting on the side of a flat bed without using bedrails?: A Little Help needed moving to and from a bed to a chair (including a wheelchair)?: A Little Help needed standing up from a chair using your arms (e.g., wheelchair or bedside chair)?: A Little Help needed to walk in hospital room?: A Little Help needed climbing 3-5 steps with a railing? : A Little 6 Click Score: 18    End of Session Equipment Utilized During Treatment: Gait belt Activity Tolerance: Patient tolerated treatment well Patient left: in  chair;with call bell/phone within reach;with chair alarm set Nurse Communication: Mobility status(Patient asked for a cup of water) PT Visit Diagnosis: Unsteadiness on feet (R26.81);Difficulty in walking, not elsewhere classified (R26.2)    Time: 1442-1500 PT Time Calculation (min) (ACUTE ONLY): 18 min   Charges:   PT Evaluation $PT Eval Moderate Complexity: 1 Mod          Alva GarnetPatrick  PT, DPT, CSCS   02/18/2019, 3:18 PM

## 2019-02-18 NOTE — Plan of Care (Signed)
  Problem: Education: Goal: Knowledge of General Education information will improve Description: Including pain rating scale, medication(s)/side effects and non-pharmacologic comfort measures 02/18/2019 0845 by Herbie Baltimore, RN Outcome: Progressing 02/17/2019 2122 by Herbie Baltimore, RN Outcome: Progressing   Problem: Health Behavior/Discharge Planning: Goal: Ability to manage health-related needs will improve 02/18/2019 0845 by Herbie Baltimore, RN Outcome: Progressing 02/17/2019 2122 by Herbie Baltimore, RN Outcome: Progressing   Problem: Clinical Measurements: Goal: Ability to maintain clinical measurements within normal limits will improve 02/18/2019 0845 by Herbie Baltimore, RN Outcome: Progressing 02/17/2019 2122 by Herbie Baltimore, RN Outcome: Progressing Goal: Diagnostic test results will improve 02/18/2019 0845 by Herbie Baltimore, RN Outcome: Progressing 02/17/2019 2122 by Herbie Baltimore, RN Outcome: Progressing   Problem: Safety: Goal: Ability to remain free from injury will improve 02/18/2019 0845 by Herbie Baltimore, RN Outcome: Progressing 02/17/2019 2122 by Herbie Baltimore, RN Outcome: Progressing   Problem: Skin Integrity: Goal: Risk for impaired skin integrity will decrease 02/18/2019 0845 by Herbie Baltimore, RN Outcome: Progressing 02/17/2019 2122 by Herbie Baltimore, RN Outcome: Progressing

## 2019-02-18 NOTE — Consult Note (Signed)
NAME: Claudia Case  DOB: [birth year]  MRN: [id number]  Date/Time: 02/18/2019 12:47 PM  REQUESTING PROVIDER: Dr. Elisabeth PigeonVachhani Subjective:  REASON FOR CONSULT: neutropenic fever ? Claudia Case is a 78 y.o. female with a history of psoriatic arthritis on methotrexate presented to the emergency room on 02/16/2019 with 1 week history of sore throat, difficulty swallowing, blood in urine and rash.  Patient was seen by PCP on 615 and was diagnosed with oral thrush and hypersensitive reaction from medications due to being out in the sun.  She also had sores in her mouth and swelling and was having trouble eating. Patient has seronegative rheumatoid arthritis and is followed by the rheumatologist at [facility] and was on Plaquenil and p.o. steroids and February 2020 Plaquenil was discontinued because of unexplained dyschromic skin lesions on the dorsum of the hands and forearms as well as newly diagnosed psoriasis on methotrexate on 01/28/2019 to take 6 tablets 15 mg once a week.  Instead patient started taking 2 tablets every other day.  She has not had a follow-up with her rheumatologist since starting methotrexate. In the ED her temperature was 100.6 and later increased to 102.5, BP 115/50, respiratory rate of 20, pulse of 106, oxygen sats of 99%.  She was found to be pancytopenic with a WBC of 0.4 and a neutrophil percentage of 64, platelet of 20, hemoglobin of 9.8.Marland Kitchen.  She had a sodium of 127, creatinine of 0.77 AST of 21 and ALT of 25 and bilirubin of 7.7.  CT head without contrast was normal and chest x-ray showed no infiltrate.  Urine examination showed 0-5 WBCs.  Blood cultures were sent and she has been started on cefepime and fluconazole.  She is also getting leucovorin injections.  Past medical history Seronegative rheumatoid arthritis Psoriasis Hypertension Hyperlipidemia GERD Hiatus hernia Age-related osteoporosis Resolved iron deficiency anemia  Past surgical history Orbital eye surgery  as a child Social history Never smoked No alcohol or illicit drug use  Outpatient medications Ferrous sulfate Folic acid Hydrocortisone cream Methotrexate Lovastatin Omeprazole Calcium carbonate vitamin D3 Magnesium oxide Multivitamin with calcium    Family History  Problem Relation Age of Onset   Breast cancer Sister [id number]   CAD Mother    Bone cancer Father    No Known Allergies  ? Current Facility-Administered Medications  Medication Dose Route Frequency Provider Last Rate Last Dose   0.9 %  sodium chloride infusion   Intravenous Continuous Alford HighlandWieting, Richard, MD 40 mL/hr at 02/17/19 2330     acetaminophen (TYLENOL) tablet 650 mg  650 mg Oral Q6H PRN Alford HighlandWieting, Richard, MD       Or   acetaminophen (TYLENOL) suppository 650 mg  650 mg Rectal Q6H PRN Alford HighlandWieting, Richard, MD   650 mg at 02/17/19 2045   acetaminophen (TYLENOL) tablet 650 mg  650 mg Oral Once PRN Jeanmarie PlantMcShane, James A, MD       ceFEPIme (MAXIPIME) 2 g in sodium chloride 0.9 % 100 mL IVPB  2 g Intravenous Q12H Albina BilletShanlever, Charles M, RPH 200 mL/hr at 02/18/19 1128 2 g at 02/18/19 1128   fluconazole (DIFLUCAN) IVPB 100 mg  100 mg Intravenous Q24H Alford HighlandWieting, Richard, MD 50 mL/hr at 02/18/19 0041 100 mg at 02/18/19 0041   leucovorin injection 10 mg  10 mg Intravenous Q12H Alford HighlandWieting, Richard, MD   10 mg at 02/18/19 1124   ondansetron (ZOFRAN) tablet 4 mg  4 mg Oral Q6H PRN Alford HighlandWieting, Richard, MD       Or  ondansetron (ZOFRAN) injection 4 mg  4 mg Intravenous Q6H PRN Wieting, Richard, MD       sodium chloride flush (NS) 0.9 % injection 3 mL  3 mL Intravenous Once Schuyler Amor, MD       tamsulosin (FLOMAX) capsule 0.4 mg  0.4 mg Oral Daily [name], MD         Abtx:  Anti-infectives (From admission, onward)   Start     Dose/Rate Route Frequency Ordered Stop   02/17/19 1200  vancomycin (VANCOCIN) 1,250 mg in sodium chloride 0.9 % 250 mL IVPB  Status:  Discontinued     1,250 mg 166.7 mL/hr over 90  Minutes Intravenous  Once 02/17/19 1156 02/17/19 1200   02/17/19 1000  ceFEPIme (MAXIPIME) 2 g in sodium chloride 0.9 % 100 mL IVPB     2 g 200 mL/hr over 30 Minutes Intravenous Every 12 hours 02/16/19 2118     02/16/19 2200  vancomycin (VANCOCIN) 1,500 mg in sodium chloride 0.9 % 500 mL IVPB  Status:  Discontinued     1,500 mg 250 mL/hr over 120 Minutes Intravenous  Once 02/16/19 2152 02/16/19 2155   02/16/19 1930  fluconazole (DIFLUCAN) IVPB 100 mg     100 mg 50 mL/hr over 60 Minutes Intravenous Every 24 hours 02/16/19 1922     02/16/19 1800  ceFEPIme (MAXIPIME) 2 g in sodium chloride 0.9 % 100 mL IVPB     2 g 200 mL/hr over 30 Minutes Intravenous  Once 02/16/19 1749 02/16/19 1826   02/16/19 1800  vancomycin (VANCOCIN) IVPB 1000 mg/200 mL premix  Status:  Discontinued     1,000 mg 200 mL/hr over 60 Minutes Intravenous  Once 02/16/19 1749 02/16/19 2152      REVIEW OF SYSTEMS:  Const:  fever,  chills,  weight loss Eyes: negative diplopia or visual changes, negative eye pain ENT: negative coryza, negative sore throat Resp: negative cough, hemoptysis, dyspnea Cards: negative for chest pain, palpitations, lower extremity edema GU: negative for frequency, dysuria + hematuria GI: Negative for abdominal pain, diarrhea, bleeding, constipation Skin: rash arms Heme:  easy bruising and gum/nose bleeding MS:  myalgias, arthralgias, back pain and muscle weakness Neurolo:headaches, dizziness, vertigo, memory problems  Psych: negative for feelings of anxiety, depression  Endocrine: negative for thyroid, diabetes Allergy/Immunology- negative for any medication or food allergies ? Pertinent Positives include : Objective:  VITALS:  BP (!) 119/58 (BP Location: Left Arm)    Pulse 95    Temp 98.4 F (36.9 C) (Oral)    Resp 16    Ht 5\' 4"  (1.626 m)    Wt 59.1 kg    SpO2 99%    BMI 22.35 kg/m  PHYSICAL EXAM:  General: Alert, cooperative, in some distress, appears stated age.  Head:  Normocephalic, without obvious abnormality, atraumatic. Eyes: Conjunctivae clear, anicteric sclerae. Pupils are equal ENT Nares normal. No drainage or sinus tenderness. Oral ulcers, tongue is inflamed  Neck: Supple, symmetrical, no adenopathy, thyroid: non tender no carotid bruit and no JVD. Back: No CVA tenderness. Lungs: Clear to auscultation bilaterally. No Wheezing or Rhonchi. No rales. Heart: Regular rate and rhythm, no murmur, rub or gallop. Abdomen: Soft, non-tender,not distended. Bowel sounds normal. No masses Extremities: erythematous plaque like lesion over the arms below the elbow on the lateral side     Some bruising seen Leg -left shin- non blanching erythematous lesion    Skin: erythematous scaly rash over the back of neck and front of chest Lymph:  Cervical, supraclavicular normal. Neurologic: Grossly non-focal Pertinent Labs Lab Results CBC    Component Value Date/Time   WBC 0.9 (LL) 02/18/2019 0545   RBC 2.17 (L) 02/18/2019 0545   HGB 7.8 (L) 02/18/2019 0545   HCT 21.8 (L) 02/18/2019 0545   PLT 28 (LL) 02/18/2019 0545   MCV 100.5 (H) 02/18/2019 0545   MCH 35.9 (H) 02/18/2019 0545   MCHC 35.8 02/18/2019 0545   RDW 15.0 02/18/2019 0545   LYMPHSABS 0.1 (L) 02/16/2019 1642   MONOABS 0.0 (L) 02/16/2019 1642   EOSABS 0.1 02/16/2019 1642   BASOSABS 0.0 02/16/2019 1642    CMP Latest Ref Rng & Units 02/18/2019 02/17/2019 02/16/2019  Glucose 70 - 99 mg/dL 295(A) 213(Y) -  BUN 8 - 23 mg/dL 86(V) 23 -  Creatinine 0.44 - 1.00 mg/dL 7.84 6.96 -  Sodium 295 - 145 mmol/L 138 129(L) -  Potassium 3.5 - 5.1 mmol/L 3.3(L) 3.5 -  Chloride 98 - 111 mmol/L 110 100 -  CO2 22 - 32 mmol/L 19(L) 19(L) -  Calcium 8.9 - 10.3 mg/dL 7.4(L) 7.0(L) -  Total Protein 6.5 - 8.1 g/dL 4.8(L) - -  Total Bilirubin 0.3 - 1.2 mg/dL 7.1(H) - 6.9(H)  Alkaline Phos 38 - 126 U/L 35(L) - -  AST 15 - 41 U/L 24 - -  ALT 0 - 44 U/L 22 - -      Microbiology: Recent Results (from the past 240  hour(s))  Culture, blood (Routine x 2)     Status: None (Preliminary result)   Collection Time: 02/16/19  3:29 PM   Specimen: BLOOD  Result Value Ref Range Status   Specimen Description BLOOD LEFT ANTECUBITAL  Final   Special Requests   Final    BOTTLES DRAWN AEROBIC AND ANAEROBIC Blood Culture results may not be optimal due to an excessive volume of blood received in culture bottles   Culture   Final    NO GROWTH 2 DAYS Performed at [facility], [address], [place], [place] [address]    Report Status PENDING  Incomplete  Culture, blood (Routine x 2)     Status: None (Preliminary result)   Collection Time: 02/16/19  4:40 PM   Specimen: BLOOD  Result Value Ref Range Status   Specimen Description   Final    BLOOD RIGHT ANTECUBITAL Performed at [facility], [address] [place], [place] [address]    Special Requests   Final    BOTTLES DRAWN AEROBIC AND ANAEROBIC Blood Culture adequate volume   Culture  Setup Time   Final    Organism ID to follow GRAM POSITIVE COCCI ANAEROBIC BOTTLE ONLY CRITICAL RESULT CALLED TO, READ BACK BY AND VERIFIED WITH: Crist Fat ON 02/17/2019 AT 1051 QSD Performed at [facility], [address] [place], [place] [address]    Culture GRAM POSITIVE COCCI  Final   Report Status PENDING  Incomplete  Urine culture     Status: None   Collection Time: 02/16/19  4:40 PM   Specimen: Urine, Random  Result Value Ref Range Status   Specimen Description   Final    URINE, RANDOM Performed at [facility], [address], [place], [place] [address]    Special Requests   Final    NONE Performed at [facility], [address] [place], [place] [address]    Culture   Final    NO GROWTH Performed at [facility], [address]  [place], [place] [address]    Report Status 02/18/2019 FINAL  Final  Blood Culture ID Panel (Reflexed)     Status: Abnormal   Collection Time: 02/16/19  4:40 PM  Result  Value Ref Range Status   Enterococcus species NOT DETECTED NOT DETECTED Final   Listeria monocytogenes NOT DETECTED NOT DETECTED Final   Staphylococcus species DETECTED (A) NOT DETECTED Final    Comment: Methicillin (oxacillin) susceptible coagulase negative staphylococcus. Possible blood culture contaminant (unless isolated from more than one blood culture draw or clinical case suggests pathogenicity). No antibiotic treatment is indicated for blood  culture contaminants. CRITICAL RESULT CALLED TO, READ BACK BY AND VERIFIED WITH: Crist FatHANNAH WANG ON 02/17/2019 AT 1051 QSD    Staphylococcus aureus (BCID) NOT DETECTED NOT DETECTED Final   Methicillin resistance NOT DETECTED NOT DETECTED Final   Streptococcus species NOT DETECTED NOT DETECTED Final   Streptococcus agalactiae NOT DETECTED NOT DETECTED Final   Streptococcus pneumoniae NOT DETECTED NOT DETECTED Final   Streptococcus pyogenes NOT DETECTED NOT DETECTED Final   Acinetobacter baumannii NOT DETECTED NOT DETECTED Final   Enterobacteriaceae species NOT DETECTED NOT DETECTED Final   Enterobacter cloacae complex NOT DETECTED NOT DETECTED Final   Escherichia coli NOT DETECTED NOT DETECTED Final   Klebsiella oxytoca NOT DETECTED NOT DETECTED Final   Klebsiella pneumoniae NOT DETECTED NOT DETECTED Final   Proteus species NOT DETECTED NOT DETECTED Final   Serratia marcescens NOT DETECTED NOT DETECTED Final   Haemophilus influenzae NOT DETECTED NOT DETECTED Final   Neisseria meningitidis NOT DETECTED NOT DETECTED Final   Pseudomonas aeruginosa NOT DETECTED NOT DETECTED Final   Candida albicans NOT DETECTED NOT DETECTED Final   Candida glabrata NOT DETECTED NOT DETECTED Final   Candida krusei NOT DETECTED NOT DETECTED Final   Candida parapsilosis NOT DETECTED NOT DETECTED Final   Candida tropicalis NOT DETECTED NOT DETECTED Final    Comment: Performed at [facility], [address] [place], [place] [address]  SARS Coronavirus 2  (CEPHEID- Performed in [facility] hospital lab), Hosp Order     Status: None   Collection Time: 02/16/19  4:42 PM   Specimen: Nasopharyngeal Swab  Result Value Ref Range Status   SARS Coronavirus 2 NEGATIVE NEGATIVE Final    Comment: (NOTE) If result is NEGATIVE SARS-CoV-2 target nucleic acids are NOT DETECTED. The SARS-CoV-2 RNA is generally detectable in upper and lower  respiratory specimens during the acute phase of infection. The lowest  concentration of SARS-CoV-2 viral copies this assay can detect is 250  copies / mL. A negative result does not preclude SARS-CoV-2 infection  and should not be used as the sole basis for treatment or other  patient management decisions.  A negative result may occur with  improper specimen collection / handling, submission of specimen other  than nasopharyngeal swab, presence of viral mutation(s) within the  areas targeted by this assay, and inadequate number of viral copies  (<250 copies / mL). A negative result must be combined with clinical  observations, patient history, and epidemiological information. If result is POSITIVE SARS-CoV-2 target nucleic acids are DETECTED. The SARS-CoV-2 RNA is generally detectable in upper and lower  respiratory specimens dur ing the acute phase of infection.  Positive  results are indicative of active infection with SARS-CoV-2.  Clinical  correlation with patient history and other diagnostic information is  necessary to determine patient infection status.  Positive results do  not rule out bacterial infection or co-infection with other viruses. If result  is PRESUMPTIVE POSTIVE SARS-CoV-2 nucleic acids MAY BE PRESENT.   A presumptive positive result was obtained on the submitted specimen  and confirmed on repeat testing.  While 2019 novel coronavirus  (SARS-CoV-2) nucleic acids may be present in the submitted sample  additional confirmatory testing may be necessary for epidemiological  and / or clinical  management purposes  to differentiate between  SARS-CoV-2 and other Sarbecovirus currently known to infect humans.  If clinically indicated additional testing with an alternate test  methodology [phone] is advised. The SARS-CoV-2 RNA is generally  detectable in upper and lower respiratory sp ecimens during the acute  phase of infection. The expected result is Negative. Fact Sheet for Patients:  BoilerBrush.com.cyhttps://www.fda.gov/media/136312/download Fact Sheet for Healthcare Providers: https://pope.com/https://www.fda.gov/media/136313/download This test is not yet approved or cleared by the [place] FDA and has been authorized for detection and/or diagnosis of SARS-CoV-2 by FDA under an Emergency Use Authorization (EUA).  This EUA will remain in effect (meaning this test can be used) for the duration of the COVID-19 declaration under Section 564(b)(1) of the Act, 21 U.S.C. section 360bbb-3(b)(1), unless the authorization is terminated or revoked sooner. Performed at [facility], [address] [place], [place] [address]     IMAGING RESULTS: I have personally reviewed the films ? Impression/Recommendation ? ?Fever in a patient with pancytopenia including neutropenia with severe mucositis due to methotrexate toxicity. Agree with covering oral organisms - on cefepime- will add flagyl for a short period for oral anerobes  Coag neg staph 1 of 4 bottle is a skin contaminant- need not treat with vancomycin  Methotrexate toxicity causing pancytopenia, abnormal LFTS Pt was taking the dose incorrectly. Seen by rheumatologist and now on Leucovorin injections  Rash over the arms, neck , front of chest suggestive of photosensitivity ? Lupus. It is atypical for psoriasis  will need biopsy ? ___________________________________________________ Discussed with patient,and care team Note:  This document was prepared using Dragon voice recognition software and may include unintentional dictation errors.

## 2019-02-18 NOTE — Consult Note (Signed)
[place] Nurse wound consult note Reason for Consult:Moisture associated skin damage to buttocks and perineum.  Recent diarrhea, recent hematuria.  Patient is neutropenic and also has thrush.  Will implement low air loss mattress, and miconazole powder.  This, along with therapeutic moisture wicking linen, should improve skin microclimate and promote healing.  Wound type:MASD to perineum Pressure Injury POA: NA Measurement:scattered psoriatic rash MASD to buttocks and perineum.  Wound ZGY:FVCB and moist Drainage (amount, consistency, odor) scant weeping Periwound:intact  Skin thin Dressing procedure/placement/frequency: apply miconazole powder to perineal and buttock skin twice daily.  Low air loss mattress to improve skin microclimate.  Exposure to dermatherapy linen as much as possible.  No disposable briefs or underpads.  Will not follow at this time.  Please re-consult if needed.  Domenic Moras MSN, RN, FNP-BC [facility] Wound, Ostomy, Continence Nurse Pager [phone]

## 2019-02-18 NOTE — Progress Notes (Signed)
Spoke to primary RN re: PICC order, RN will talk to MD first if PICC placement still needed and will update IV team.

## 2019-02-18 NOTE — Progress Notes (Signed)
Hematology/Oncology Consult note [facility]  [phone] [phone]  Patient Care Team: Sherrin Daisy, MD as PCP - General (Family Medicine)   Name of the patient: Claudia Case  [id number]  [birth year]   Date of visit:02/18/2019  Interval history- she reports feeling better today. Mouth still feels sore. She has been afebrile   Review of systems- Review of Systems  Constitutional: Positive for malaise/fatigue. Negative for chills, fever and weight loss.  HENT: Negative for congestion, ear discharge and nosebleeds.        Burning pain in mouth  Eyes: Negative for blurred vision.  Respiratory: Negative for cough, hemoptysis, sputum production, shortness of breath and wheezing.   Cardiovascular: Negative for chest pain, palpitations, orthopnea and claudication.  Gastrointestinal: Negative for abdominal pain, blood in stool, constipation, diarrhea, heartburn, melena, nausea and vomiting.  Genitourinary: Negative for dysuria, flank pain, frequency, hematuria and urgency.  Musculoskeletal: Negative for back pain, joint pain and myalgias.  Skin: Negative for rash.  Neurological: Negative for dizziness, tingling, focal weakness, seizures, weakness and headaches.  Endo/Heme/Allergies: Does not bruise/bleed easily.  Psychiatric/Behavioral: Negative for depression and suicidal ideas. The patient does not have insomnia.       No Known Allergies   Past Medical History:  Diagnosis Date  . Arthritis   . GERD (gastroesophageal reflux disease)   . Hyperlipidemia   . Hypertension   . Psoriatic arthritis [facility])      Past Surgical History:  Procedure Laterality Date  . NO PAST SURGERIES      Social History   Socioeconomic History  . Marital status: Widowed    Spouse name: Not on file  . Number of children: Not on file  . Years of education: Not on file  . Highest education level: Not on file  Occupational History  . Not on file   Social Needs  . Financial resource strain: Not hard at all  . Food insecurity    Worry: Never true    Inability: Never true  . Transportation needs    Medical: No    Non-medical: No  Tobacco Use  . Smoking status: Never Smoker  . Smokeless tobacco: Never Used  Substance and Sexual Activity  . Alcohol use: Not Currently  . Drug use: Never  . Sexual activity: Not Currently  Lifestyle  . Physical activity    Days per week: 2 days    Minutes per session: 20 min  . Stress: Not at all  Relationships  . Social Herbalist on phone: Once a week    Gets together: Once a week    Attends religious service: 1 to 4 times per year    Active member of club or organization: No    Attends meetings of clubs or organizations: Never    Relationship status: Widowed  . Intimate partner violence    Fear of current or ex partner: No    Emotionally abused: No    Physically abused: No    Forced sexual activity: No  Other Topics Concern  . Not on file  Social History Narrative   Lives alone    Family History  Problem Relation Age of Onset  . Breast cancer Sister 61  . CAD Mother   . Bone cancer Father      Current Facility-Administered Medications:  .  0.9 %  sodium chloride infusion, , Intravenous, Continuous, Wieting, Richard, MD, Last Rate: 40 mL/hr at 02/17/19 2330 .  acetaminophen (TYLENOL)  tablet 650 mg, 650 mg, Oral, Q6H PRN **OR** acetaminophen (TYLENOL) suppository 650 mg, 650 mg, Rectal, Q6H PRN, Loletha Grayer, MD, 650 mg at 02/17/19 2045 .  acetaminophen (TYLENOL) tablet 650 mg, 650 mg, Oral, Once PRN, Schuyler Amor, MD .  ceFEPIme (MAXIPIME) 2 g in sodium chloride 0.9 % 100 mL IVPB, 2 g, Intravenous, Q12H, Shanlever, Pierce Crane, RPH, Last Rate: 200 mL/hr at 02/18/19 1128, 2 g at 02/18/19 1128 .  fluconazole (DIFLUCAN) IVPB 100 mg, 100 mg, Intravenous, Q24H, Wieting, Richard, MD, Last Rate: 50 mL/hr at 02/18/19 0041, 100 mg at 02/18/19 0041 .  leucovorin injection  10 mg, 10 mg, Intravenous, Q12H, Wieting, Richard, MD, 10 mg at 02/18/19 1124 .  ondansetron (ZOFRAN) tablet 4 mg, 4 mg, Oral, Q6H PRN **OR** ondansetron (ZOFRAN) injection 4 mg, 4 mg, Intravenous, Q6H PRN, Wieting, Richard, MD .  sodium chloride flush (NS) 0.9 % injection 3 mL, 3 mL, Intravenous, Once, McShane, James A, MD .  tamsulosin (FLOMAX) capsule 0.4 mg, 0.4 mg, Oral, Daily, [name], MD  Physical exam:  Vitals:   02/17/19 2106 02/17/19 2132 02/17/19 2230 02/18/19 0528  BP: 105/61 (!) 104/46 (!) 110/52 (!) 119/58  Pulse: 92 92 91 95  Resp: '16 16 16 16  ' Temp: 99 F (37.2 C) 97.7 F (36.5 C) 98.9 F (37.2 C) 98.4 F (36.9 C)  TempSrc: Axillary Axillary Oral Oral  SpO2: 98% 98% 97% 99%  Weight:      Height:       Physical Exam Constitutional:      Comments: Appears fatigued. Mucositis is somewhat better  HENT:     Head: Normocephalic and atraumatic.     Mouth/Throat:     Comments: Moderate to severe mucositis Eyes:     Pupils: Pupils are equal, round, and reactive to light.  Neck:     Musculoskeletal: Normal range of motion.  Cardiovascular:     Rate and Rhythm: Normal rate and regular rhythm.     Heart sounds: Normal heart sounds.  Pulmonary:     Effort: Pulmonary effort is normal.     Breath sounds: Normal breath sounds.  Abdominal:     General: Bowel sounds are normal.     Palpations: Abdomen is soft.  Skin:    General: Skin is warm and dry.  Neurological:     Mental Status: She is alert and oriented to person, place, and time.      CMP Latest Ref Rng & Units 02/18/2019  Glucose 70 - 99 mg/dL 106(H)  BUN 8 - 23 mg/dL 24(H)  Creatinine 0.44 - 1.00 mg/dL 0.54  Sodium 135 - 145 mmol/L 138  Potassium 3.5 - 5.1 mmol/L 3.3(L)  Chloride 98 - 111 mmol/L 110  CO2 22 - 32 mmol/L 19(L)  Calcium 8.9 - 10.3 mg/dL 7.4(L)  Total Protein 6.5 - 8.1 g/dL 4.8(L)  Total Bilirubin 0.3 - 1.2 mg/dL 7.1(H)  Alkaline Phos 38 - 126 U/L 35(L)  AST 15 - 41 U/L 24   ALT 0 - 44 U/L 22   CBC Latest Ref Rng & Units 02/18/2019  WBC 4.0 - 10.5 K/uL 0.9(LL)  Hemoglobin 12.0 - 15.0 g/dL 7.8(L)  Hematocrit 36.0 - 46.0 % 21.8(L)  Platelets 150 - 400 K/uL 28(LL)    '@IMAGES' @  Ct Head Wo Contrast  Result Date: 02/16/2019 CLINICAL DATA:  Acute confusion. EXAM: CT HEAD WITHOUT CONTRAST TECHNIQUE: Contiguous axial images were obtained from the base of the skull through the vertex without intravenous  contrast. COMPARISON:  Remote head CT and brain MRI December 2010 FINDINGS: Brain: No evidence of acute infarction, hemorrhage, hydrocephalus, extra-axial collection or mass lesion/mass effect. Mild chronic small vessel ischemia. Remote lacunar infarct in the periventricular right frontal lobe. Vascular: Atherosclerosis of skullbase vasculature without hyperdense vessel or abnormal calcification. Skull: No fracture or focal lesion. Sinuses/Orbits: Paranasal sinuses and mastoid air cells are clear. The visualized orbits are unremarkable. Other: None. IMPRESSION: No acute intracranial abnormality. Electronically Signed   By: Keith Rake M.D.   On: 02/16/2019 19:07   US Renal  Result Date: 02/18/2019 CLINICAL DATA:  Urinary obstruction. EXAM: RENAL / URINARY TRACT ULTRASOUND COMPLETE COMPARISON:  None. FINDINGS: Right Kidney: Renal measurements: 9.8 x 4.5 x 3.8 cm = volume: 88 mL . Echogenicity within normal limits. No mass or hydronephrosis visualized. Left Kidney: Renal measurements: 9.3 x 5.1 x 4.2 cm = volume: 106 mL. Echogenicity within normal limits. No mass or hydronephrosis visualized. Bladder: Appears normal for degree of bladder distention. IMPRESSION: Normal renal ultrasound. Electronically Signed   By: Marijo Conception M.D.   On: 02/18/2019 10:10   Dg Chest Port 1 View  Result Date: 02/16/2019 CLINICAL DATA:  Sore throat with vomiting EXAM: PORTABLE CHEST 1 VIEW COMPARISON:  CT 08/03/2009, radiograph 08/01/2009 FINDINGS: No focal airspace opacity or pleural  effusion. Mild cardiomegaly. Moderate to large hiatal hernia. IMPRESSION: No active disease.  Hiatal hernia. Electronically Signed   By: Donavan Foil M.D.   On: 02/16/2019 16:51   [place] Ekg Site Rite  Result Date: 02/17/2019 If Site Rite image not attached, placement could not be confirmed due to current cardiac rhythm.  US Abdomen Limited Ruq  Result Date: 02/16/2019 CLINICAL DATA:  78 year old female with elevated bilirubin. EXAM: ULTRASOUND ABDOMEN LIMITED RIGHT UPPER QUADRANT COMPARISON:  08/03/2009 CT FINDINGS: Gallbladder: Gallbladder sludge is noted. There is no evidence of cholelithiasis or acute cholecystitis. Common bile duct: Diameter: 3.9 mm. No evidence of intrahepatic or extrahepatic biliary dilatation. Liver: No focal lesion identified. Within normal limits in parenchymal echogenicity. Portal vein is patent on color Doppler imaging with normal direction of blood flow towards the liver. IMPRESSION: Gallbladder sludge, otherwise unremarkable RIGHT UPPER quadrant abdominal ultrasound. No evidence of biliary dilatation. Electronically Signed   By: Margarette Canada M.D.   On: 02/16/2019 19:27     Assessment and plan- Patient is a 78 y.o. female with seronegative rheumatoid arthritis on weekly methotrexate presenting with severe pancytopenia and neutropenic fever  1.  Pancytopenia: Likely secondary to methotrexate.  Continue to hold methotrexate.  If pancytopenia fails to resolve in the next 2 to 3 weeks I will consider bone marrow biopsy as an outpatient.  I will check a peripheral flow cytometry tomorrow.  Continue Neupogen until [place] greater than 1.  I will add some anemia work-up including ferritin and iron studies, B12 and folate, TSH reticulocyte count, haptoglobin at this point  2.  Neutropenic fever: Continue Neupogen and broad-spectrum antibiotics.  She has been afebrile in the last 24 hours.  Continue leucovorin until her counts stabilize.  3.  Elevated direct and indirect  hyperbilirubinemia: Possibly secondary to methotrexate.  Continue to monitor.  PT/INR is normal.  If it worsens consider GI consult  I will follow-up with her as an outpatient   Visit Diagnosis Pancytopenia Neutropenic fever Elevated bilirubin   Dr. Randa Evens, MD, MPH [facility] at [facility] [phone] 02/18/2019 1:02 PM

## 2019-02-18 NOTE — Progress Notes (Addendum)
Patient had not voided reported from day shift, bladder scanned patient, 690mls in bladder. Received verbal order from Gardiner Barefoot NP for prn in/ out bladder scan.

## 2019-02-18 NOTE — Progress Notes (Signed)
[place] at [place] NAME: Claudia Case    MR#:  [id number]  DATE OF BIRTH:  [birth year]  SUBJECTIVE:  CHIEF COMPLAINT:   Chief Complaint  Patient presents with  . Hematuria  . Emesis   Brought with confusion and noted to have pancytopenia and some fever.  Today had some bleed from skin breakdown.  No new complaints.  REVIEW OF SYSTEMS:  CONSTITUTIONAL: No fever, have fatigue or weakness.  EYES: No blurred or double vision.  EARS, NOSE, AND THROAT: No tinnitus or ear pain.  RESPIRATORY: No cough, shortness of breath, wheezing or hemoptysis.  CARDIOVASCULAR: No chest pain, orthopnea, edema.  GASTROINTESTINAL: No nausea, vomiting, diarrhea or abdominal pain.  GENITOURINARY: No dysuria, hematuria.  ENDOCRINE: No polyuria, nocturia,  HEMATOLOGY: No anemia, easy bruising or bleeding SKIN: No rash or lesion. MUSCULOSKELETAL: No joint pain or arthritis.   NEUROLOGIC: No tingling, numbness, have generalized weakness.  PSYCHIATRY: No anxiety or depression.   ROS  DRUG ALLERGIES:  No Known Allergies  VITALS:  Blood pressure 94/65, pulse 96, temperature 99.7 F (37.6 C), resp. rate 16, height 5\' 4"  (1.626 m), weight 59.1 kg, SpO2 100 %.  PHYSICAL EXAMINATION:  GENERAL:  78 y.o.-year-old patient lying in the bed with no acute distress.  EYES: Pupils equal, round, reactive to light and accommodation. No scleral icterus. Extraocular muscles intact.  HEENT: Head atraumatic, normocephalic. Oropharynx and nasopharynx clear.  NECK:  Supple, no jugular venous distention. No thyroid enlargement, no tenderness.  LUNGS: Normal breath sounds bilaterally, no wheezing, rales,rhonchi or crepitation. No use of accessory muscles of respiration.  CARDIOVASCULAR: S1, S2 normal. No murmurs, rubs, or gallops.  ABDOMEN: Soft, nontender, nondistended. Bowel sounds present. No organomegaly or mass.  EXTREMITIES: No pedal edema, cyanosis, or clubbing.  NEUROLOGIC:  Cranial nerves II through XII are intact. Muscle strength 4/5 in all extremities. Sensation intact. Gait not checked.  Generalized PSYCHIATRIC: The patient is alert and oriented x 3.  SKIN: No obvious rash, lesion, or ulcer.  Ecchymosis on the skin with some breakdown and signs of recent bleed on left forearm.  Physical Exam LABORATORY PANEL:   CBC Recent Labs  Lab 02/18/19 0545  WBC 0.9*  HGB 7.8*  HCT 21.8*  PLT 28*   ------------------------------------------------------------------------------------------------------------------  Chemistries  Recent Labs  Lab 02/18/19 0545  NA 138  K 3.3*  CL 110  CO2 19*  GLUCOSE 106*  BUN 24*  CREATININE 0.54  CALCIUM 7.4*  AST 24  ALT 22  ALKPHOS 35*  BILITOT 7.1*   ------------------------------------------------------------------------------------------------------------------  Cardiac Enzymes No results for input(s): TROPONINI in the last 168 hours. ------------------------------------------------------------------------------------------------------------------  RADIOLOGY:  Ct Head Wo Contrast  Result Date: 02/16/2019 CLINICAL DATA:  Acute confusion. EXAM: CT HEAD WITHOUT CONTRAST TECHNIQUE: Contiguous axial images were obtained from the base of the skull through the vertex without intravenous contrast. COMPARISON:  Remote head CT and brain MRI December 2010 FINDINGS: Brain: No evidence of acute infarction, hemorrhage, hydrocephalus, extra-axial collection or mass lesion/mass effect. Mild chronic small vessel ischemia. Remote lacunar infarct in the periventricular right frontal lobe. Vascular: Atherosclerosis of skullbase vasculature without hyperdense vessel or abnormal calcification. Skull: No fracture or focal lesion. Sinuses/Orbits: Paranasal sinuses and mastoid air cells are clear. The visualized orbits are unremarkable. Other: None. IMPRESSION: No acute intracranial abnormality. Electronically Signed   By: Keith Rake  M.D.   On: 02/16/2019 19:07   US Renal  Result Date: 02/18/2019 CLINICAL DATA:  Urinary obstruction.  EXAM: RENAL / URINARY TRACT ULTRASOUND COMPLETE COMPARISON:  None. FINDINGS: Right Kidney: Renal measurements: 9.8 x 4.5 x 3.8 cm = volume: 88 mL . Echogenicity within normal limits. No mass or hydronephrosis visualized. Left Kidney: Renal measurements: 9.3 x 5.1 x 4.2 cm = volume: 106 mL. Echogenicity within normal limits. No mass or hydronephrosis visualized. Bladder: Appears normal for degree of bladder distention. IMPRESSION: Normal renal ultrasound. Electronically Signed   By: James  Green Jr M.D.   On: 06/22/Lupita Raider2020 10:10   Dg Chest Port 1 View  Result Date: 02/16/2019 CLINICAL DATA:  Sore throat with vomiting EXAM: PORTABLE CHEST 1 VIEW COMPARISON:  CT 08/03/2009, radiograph 08/01/2009 FINDINGS: No focal airspace opacity or pleural effusion. Mild cardiomegaly. Moderate to large hiatal hernia. IMPRESSION: No active disease.  Hiatal hernia. Electronically Signed   By: Jasmine PangKim  Fujinaga M.D.   On: 02/16/2019 16:51   [place] Ekg Site Rite  Result Date: 02/17/2019 If Site Rite image not attached, placement could not be confirmed due to current cardiac rhythm.  [place] Abdomen Limited Ruq  Result Date: 02/16/2019 CLINICAL DATA:  78 year old female with elevated bilirubin. EXAM: ULTRASOUND ABDOMEN LIMITED RIGHT UPPER QUADRANT COMPARISON:  08/03/2009 CT FINDINGS: Gallbladder: Gallbladder sludge is noted. There is no evidence of cholelithiasis or acute cholecystitis. Common bile duct: Diameter: 3.9 mm. No evidence of intrahepatic or extrahepatic biliary dilatation. Liver: No focal lesion identified. Within normal limits in parenchymal echogenicity. Portal vein is patent on color Doppler imaging with normal direction of blood flow towards the liver. IMPRESSION: Gallbladder sludge, otherwise unremarkable RIGHT UPPER quadrant abdominal ultrasound. No evidence of biliary dilatation. Electronically Signed   By: Harmon PierJeffrey  Hu  M.D.   On: 02/16/2019 19:27    ASSESSMENT AND PLAN:   Active Problems:   Neutropenic fever (HCC)   1.  Neutropenic fever.  Follow-up blood cultures.  Empiric cefepime.  Since the patient also has thrush ,give empiric Diflucan.  Follow temperature curve. no fever.  Bl cx shows contamination.  Called ID as no source so far on infection. 2.  Pancytopenia.  Likely reaction from methotrexate.  Leucovorin IV every 12 hours.  also give Granix to elevate the white count.  Oncology consultation appreciated.  ER physician spoke with the lab and reported no schistocytes seen on the smear.  Continue to watch platelet count closely.  Oncology recommended rheumatology consultation but they are not available on the weekends. Called consult now. Her platelet count dropped to 10,000 - s/p 1 U plt transfusion. 3.  Jaundice with elevated bilirubin.  Likely also reaction from methotrexate.  Right upper quadrant sonogram - no acute findings. 4.  Hyponatremia.  Gentle IV fluids with normal saline-improved some.  This could be the reason for confusion. 5.  Thrush.  IV Diflucan. 6.  Confusion.  CT scan of the head done- no active findings / bleed. Improved now. 7.  Reported diarrhea send off stool studies- no BM since admisison, d/c contact isolation. 8.  Hyperlipidemia unspecified.  Hold statin with elevated bilirubin 9.  GERD.  Hold PPI 10.  Stage I decubiti on the sacral area with just redness on the skin   All the records are reviewed and case discussed with Care Management/Social Workerr. Management plans discussed with the patient, family and they are in agreement.  CODE STATUS: Full code.  TOTAL TIME TAKING CARE OF THIS PATIENT: *35 minutes.   I discussed the case with oncologist.  I tried calling patient's sister but could not speak to her on the phone  POSSIBLE D/C IN 2-3 DAYS, DEPENDING ON CLINICAL CONDITION.   Altamese DillingVaibhavkumar Symantha Steeber M.D on 02/18/2019   Between 7am to 6pm - Pager -  [phone]  After 6pm go to [email or web address] - password [email or web address]  [facility]  Office  [phone]  CC: Primary care physician; Sula RumpleVirk, Charanjit, MD  Note: This dictation was prepared with Dragon dictation along with smaller phrase technology. Any transcriptional errors that result from this process are unintentional.

## 2019-02-19 DIAGNOSIS — R21 Rash and other nonspecific skin eruption: Secondary | ICD-10-CM

## 2019-02-19 LAB — COMPREHENSIVE METABOLIC PANEL
ALT: 22 U/L (ref 0–44)
AST: 16 U/L (ref 15–41)
Albumin: 2.1 g/dL — ABNORMAL LOW (ref 3.5–5.0)
Alkaline Phosphatase: 42 U/L (ref 38–126)
Anion gap: 10 (ref 5–15)
BUN: 26 mg/dL — ABNORMAL HIGH (ref 8–23)
CO2: 20 mmol/L — ABNORMAL LOW (ref 22–32)
Calcium: 7.5 mg/dL — ABNORMAL LOW (ref 8.9–10.3)
Chloride: 114 mmol/L — ABNORMAL HIGH (ref 98–111)
Creatinine, Ser: 0.47 mg/dL (ref 0.44–1.00)
GFR calc Af Amer: >60 mL/min (ref >60)
GFR calc non Af Amer: >60 mL/min (ref >60)
Glucose, Bld: 120 mg/dL — ABNORMAL HIGH (ref 70–99)
Potassium: 3.5 mmol/L (ref 3.5–5.1)
Sodium: 144 mmol/L (ref 135–145)
Total Bilirubin: 6.9 mg/dL — ABNORMAL HIGH (ref 0.3–1.2)
Total Protein: 4.9 g/dL — ABNORMAL LOW (ref 6.5–8.1)

## 2019-02-19 LAB — RETICULOCYTES
Immature Retic Fract: 33.4 % — ABNORMAL HIGH (ref 2.3–15.9)
RBC.: 2.34 MIL/uL — ABNORMAL LOW (ref 3.87–5.11)
Retic Count, Absolute: 22.9 10*3/uL (ref 19.0–186.0)
Retic Ct Pct: 1 % (ref 0.4–3.1)

## 2019-02-19 LAB — CBC
HCT: 23.4 % — ABNORMAL LOW (ref 36.0–46.0)
Hemoglobin: 8.1 g/dL — ABNORMAL LOW (ref 12.0–15.0)
MCH: 34.6 pg — ABNORMAL HIGH (ref 26.0–34.0)
MCHC: 34.6 g/dL (ref 30.0–36.0)
MCV: 100 fL (ref 80.0–100.0)
Platelets: 24 10*3/uL — CL (ref 150–400)
RBC: 2.34 MIL/uL — ABNORMAL LOW (ref 3.87–5.11)
RDW: 15.3 % (ref 11.5–15.5)
WBC: 2.1 10*3/uL — ABNORMAL LOW (ref 4.0–10.5)
nRBC: 1.4 % — ABNORMAL HIGH (ref 0.0–0.2)

## 2019-02-19 LAB — IRON AND TIBC
Iron: 111 ug/dL (ref 28–170)
Saturation Ratios: 100 % — ABNORMAL HIGH (ref 10.4–31.8)
TIBC: 111 ug/dL — ABNORMAL LOW (ref 250–450)

## 2019-02-19 LAB — FOLATE: Folate: 44.3 ng/mL (ref >5.9)

## 2019-02-19 LAB — CULTURE, BLOOD (ROUTINE X 2): Special Requests: ADEQUATE

## 2019-02-19 LAB — VITAMIN B12: Vitamin B-12: 1836 pg/mL — ABNORMAL HIGH (ref 180–914)

## 2019-02-19 LAB — FERRITIN: Ferritin: 2143 ng/mL — ABNORMAL HIGH (ref 11–307)

## 2019-02-19 LAB — TSH: TSH: 0.517 u[IU]/mL (ref 0.350–4.500)

## 2019-02-19 MED ORDER — LEUCOVORIN CALCIUM INJECTION 100 MG
10.0000 mg | Freq: Two times a day (BID) | INTRAMUSCULAR | Status: DC
  Administered 2019-02-19: 10 mg via INTRAVENOUS
  Filled 2019-02-19 (×3): qty 0.5

## 2019-02-19 MED ORDER — SODIUM CHLORIDE 0.45 % IV SOLN
INTRAVENOUS | Status: DC
  Administered 2019-02-19: 20:00:00 via INTRAVENOUS

## 2019-02-19 NOTE — TOC Progression Note (Signed)
Transition of Care [facility] - Progression Note    Patient Details  Name: Claudia Case MRN: [id number] Date of Birth: 05-Jan-1941  Transition of Care [facility]) CM/SW Contact  Shelbie Hutching, RN Phone Number: 02/19/2019, 4:21 PM  Clinical Narrative:     Patient has been confused.  RNCM attempted to reach out to patient's sister with no answer.  RNCM will attempt to speak with patient and patient's sister again tomorrow.        Expected Discharge Plan and Services                                                 Social Determinants of Health (SDOH) Interventions    Readmission Risk Interventions No flowsheet data found.

## 2019-02-19 NOTE — Progress Notes (Signed)
   Date of Admission:  02/16/2019     Fever curve trending down Subjective: Pt still in discomfort, says mouth burning, unable to eat  Medications:  . leucovorin  10 mg Intravenous Q12H  . nystatin   Topical BID  . sodium chloride flush  3 mL Intravenous Once  . tamsulosin  0.4 mg Oral Daily    Objective: Vital signs in last 24 hours: Temp:  [98 F (36.7 C)-99.7 F (37.6 C)] 98.8 F (37.1 C) (06/23 0543) Pulse Rate:  [80-108] 80 (06/23 0543) Resp:  [16] 16 (06/23 0543) BP: (94-118)/(55-70) 111/70 (06/23 0543) SpO2:  [95 %-100 %] 99 % (06/23 0543)  PHYSICAL EXAM:  General: weak Oral mucositis Lips mucosa raw Neck: Supple, Lungs: b/l air entry Heart: s1s2 Skin: rash over forearm and back of neck Lymph: Cervical, supraclavicular normal. Neurologic: Grossly non-focal  Lab Results Recent Labs    02/18/19 0545 02/19/19 0439  WBC 0.9* 2.1*  HGB 7.8* 8.1*  HCT 21.8* 23.4*  NA 138 144  K 3.3* 3.5  CL 110 114*  CO2 19* 20*  BUN 24* 26*  CREATININE 0.54 0.47   Liver Panel Recent Labs    02/16/19 1920 02/18/19 0545 02/19/19 0439  PROT  --  4.8* 4.9*  ALBUMIN  --  2.1* 2.1*  AST  --  24 16  ALT  --  22 22  ALKPHOS  --  35* 42  BILITOT 6.9* 7.1* 6.9*  BILIDIR 4.3* 4.5*  --   IBILI 2.6*  --   --    Sedimentation Rate No results for input(s): ESRSEDRATE in the last 72 hours. C-Reactive Protein No results for input(s): CRP in the last 72 hours.  Microbiology: 6/20 Staph epi 6/22 NG so far Studies/Results: US Renal  Result Date: 02/18/2019 CLINICAL DATA:  Urinary obstruction. EXAM: RENAL / URINARY TRACT ULTRASOUND COMPLETE COMPARISON:  None. FINDINGS: Right Kidney: Renal measurements: 9.8 x 4.5 x 3.8 cm = volume: 88 mL . Echogenicity within normal limits. No mass or hydronephrosis visualized. Left Kidney: Renal measurements: 9.3 x 5.1 x 4.2 cm = volume: 106 mL. Echogenicity within normal limits. No mass or hydronephrosis visualized. Bladder: Appears normal  for degree of bladder distention. IMPRESSION: Normal renal ultrasound. Electronically Signed   By: Marijo Conception M.D.   On: 02/18/2019 10:10   [place] Ekg Site Rite  Result Date: 02/17/2019 If Site Rite image not attached, placement could not be confirmed due to current cardiac rhythm.    Assessment/Plan: Fever in a patient with pancytopenia including neutropenia with severe mucositis due to methotrexate toxicity. On cefepime and flagyl  Coag neg staph 1 of 4 bottle is a skin contaminant- no treatment needed   Methotrexate toxicity causing pancytopenia due to bone marrow suppression, abnormal LFTS, fever Neutropenia improving, so is fever  Rash over the arms, neck , front of chest suggestive of photosensitivity ? Lupus. It is atypical for psoriasis   Will follow along

## 2019-02-19 NOTE — Care Management Important Message (Signed)
Important Message  Patient Details  Name: Claudia Case MRN: [id number] Date of Birth: 06-11-1941   Medicare Important Message Given:  Yes     Juliann Pulse A Alek Borges 02/19/2019, 10:47 AM

## 2019-02-19 NOTE — Progress Notes (Signed)
[facility] NAME: Claudia Case    MR#:  [id number]  DATE OF BIRTH:  01-20-1941  SUBJECTIVE:  CHIEF COMPLAINT:   Chief Complaint  Patient presents with  . Hematuria  . Emesis   Brought with confusion and noted to have pancytopenia and some fever.   No new complaints.  Continue to have pain in mouth and not able to eat much.  Getting oral liquid.  REVIEW OF SYSTEMS:  CONSTITUTIONAL: No fever, have fatigue or weakness.  EYES: No blurred or double vision.  EARS, NOSE, AND THROAT: No tinnitus or ear pain.  RESPIRATORY: No cough, shortness of breath, wheezing or hemoptysis.  CARDIOVASCULAR: No chest pain, orthopnea, edema.  GASTROINTESTINAL: No nausea, vomiting, diarrhea or abdominal pain.  GENITOURINARY: No dysuria, hematuria.  ENDOCRINE: No polyuria, nocturia,  HEMATOLOGY: No anemia, easy bruising or bleeding SKIN: No rash or lesion. MUSCULOSKELETAL: No joint pain or arthritis.   NEUROLOGIC: No tingling, numbness, have generalized weakness.  PSYCHIATRY: No anxiety or depression.   ROS  DRUG ALLERGIES:  No Known Allergies  VITALS:  Blood pressure (!) 108/52, pulse 97, temperature 97.8 F (36.6 C), temperature source Oral, resp. rate 16, height '5\' 4"'  (1.626 m), weight 59.1 kg, SpO2 98 %.  PHYSICAL EXAMINATION:  GENERAL:  78 y.o.-year-old patient lying in the bed with no acute distress.  EYES: Pupils equal, round, reactive to light and accommodation. No scleral icterus. Extraocular muscles intact.  HEENT: Head atraumatic, normocephalic. Oropharynx and nasopharynx clear.  Rashes on tongue and oral mucosa NECK:  Supple, no jugular venous distention. No thyroid enlargement, no tenderness.  LUNGS: Normal breath sounds bilaterally, no wheezing, rales,rhonchi or crepitation. No use of accessory muscles of respiration.  CARDIOVASCULAR: S1, S2 normal. No murmurs, rubs, or gallops.  ABDOMEN: Soft, nontender, nondistended. Bowel sounds  present. No organomegaly or mass.  EXTREMITIES: No pedal edema, cyanosis, or clubbing.  NEUROLOGIC: Cranial nerves II through XII are intact. Muscle strength 4/5 in all extremities. Sensation intact. Gait not checked.  Generalized PSYCHIATRIC: The patient is alert and oriented x 3.  SKIN: No obvious rash, lesion, or ulcer.  Ecchymosis on the skin with some breakdown and signs of recent bleed on left forearm.  Physical Exam LABORATORY PANEL:   CBC Recent Labs  Lab 02/19/19 0439  WBC 2.1*  HGB 8.1*  HCT 23.4*  PLT 24*   ------------------------------------------------------------------------------------------------------------------  Chemistries  Recent Labs  Lab 02/19/19 0439  NA 144  K 3.5  CL 114*  CO2 20*  GLUCOSE 120*  BUN 26*  CREATININE 0.47  CALCIUM 7.5*  AST 16  ALT 22  ALKPHOS 42  BILITOT 6.9*   ------------------------------------------------------------------------------------------------------------------  Cardiac Enzymes No results for input(s): TROPONINI in the last 168 hours. ------------------------------------------------------------------------------------------------------------------  RADIOLOGY:  US Renal  Result Date: 02/18/2019 CLINICAL DATA:  Urinary obstruction. EXAM: RENAL / URINARY TRACT ULTRASOUND COMPLETE COMPARISON:  None. FINDINGS: Right Kidney: Renal measurements: 9.8 x 4.5 x 3.8 cm = volume: 88 mL . Echogenicity within normal limits. No mass or hydronephrosis visualized. Left Kidney: Renal measurements: 9.3 x 5.1 x 4.2 cm = volume: 106 mL. Echogenicity within normal limits. No mass or hydronephrosis visualized. Bladder: Appears normal for degree of bladder distention. IMPRESSION: Normal renal ultrasound. Electronically Signed   By: Marijo Conception M.D.   On: 02/18/2019 10:10    ASSESSMENT AND PLAN:   Active Problems:   Neutropenic fever ([place])   1.  Neutropenic fever.  Follow-up blood cultures.  Empiric cefepime.  Since the patient  also has thrush ,give empiric Diflucan.  Follow temperature curve. no fever.  Bl cx shows contamination.  Called ID as no source so far on infection.  Suggested to add Flagyl for oral anaerobic coverage for mucositis. 2.  Pancytopenia.  Likely reaction from methotrexate.  Leucovorin IV every 12 hours.  also give Granix to elevate the white count.  Oncology consultation appreciated.   no schistocytes seen on the smear.  Continue to watch platelet count closely.  Oncology recommended rheumatology consultation, if her counts does not improve in the next 2 to 3 weeks then they might do bone marrow biopsy.   Her platelet count dropped to 10,000 - s/p 1 U plt transfusion 02/17/19-stable now. 3.  Jaundice with elevated bilirubin.  Likely also reaction from methotrexate.  Right upper quadrant sonogram - no acute findings. 4.  Hyponatremia.  Gentle IV fluids with normal saline-improved some.  This could be the reason for confusion.  No sodium level is normal. 5.  Thrush.  IV Diflucan. 6.  Confusion.  CT scan of the head done- no active findings / bleed. Improved now. 7.  Reported diarrhea send off stool studies- no BM since admisison, d/c contact isolation. 8.  Hyperlipidemia unspecified.  Hold statin with elevated bilirubin 9.  GERD.  Hold PPI 10.  Stage I decubiti on the sacral area with just redness on the skin  Physical therapy evaluation to help discharge planning.  All the records are reviewed and case discussed with Care Management/Social Workerr. Management plans discussed with the patient, family and they are in agreement.  CODE STATUS: Full code.  TOTAL TIME TAKING CARE OF THIS PATIENT: *35 minutes.   I discussed the case with oncologist.   POSSIBLE D/C IN 2-3 DAYS, DEPENDING ON CLINICAL CONDITION.   [name] M.D on 02/19/2019   Between 7am to 6pm - Pager - [phone]  After 6pm go to [email or web address] - password [email or web address] [facility]  Office   [phone]  CC: Primary care physician; Sherrin Daisy, MD  Note: This dictation was prepared with Dragon dictation along with smaller phrase technology. Any transcriptional errors that result from this process are unintentional.

## 2019-02-20 DIAGNOSIS — D61810 Antineoplastic chemotherapy induced pancytopenia: Principal | ICD-10-CM

## 2019-02-20 LAB — COMPREHENSIVE METABOLIC PANEL
ALT: 27 U/L (ref 0–44)
AST: 38 U/L (ref 15–41)
Albumin: 2.1 g/dL — ABNORMAL LOW (ref 3.5–5.0)
Alkaline Phosphatase: 48 U/L (ref 38–126)
Anion gap: 9 (ref 5–15)
BUN: 31 mg/dL — ABNORMAL HIGH (ref 8–23)
CO2: 21 mmol/L — ABNORMAL LOW (ref 22–32)
Calcium: 7.7 mg/dL — ABNORMAL LOW (ref 8.9–10.3)
Chloride: 118 mmol/L — ABNORMAL HIGH (ref 98–111)
Creatinine, Ser: 0.65 mg/dL (ref 0.44–1.00)
GFR calc Af Amer: >60 mL/min (ref >60)
GFR calc non Af Amer: >60 mL/min (ref >60)
Glucose, Bld: 158 mg/dL — ABNORMAL HIGH (ref 70–99)
Potassium: 3.3 mmol/L — ABNORMAL LOW (ref 3.5–5.1)
Sodium: 148 mmol/L — ABNORMAL HIGH (ref 135–145)
Total Bilirubin: 6.5 mg/dL — ABNORMAL HIGH (ref 0.3–1.2)
Total Protein: 4.9 g/dL — ABNORMAL LOW (ref 6.5–8.1)

## 2019-02-20 LAB — CBC
HCT: 25.2 % — ABNORMAL LOW (ref 36.0–46.0)
Hemoglobin: 9 g/dL — ABNORMAL LOW (ref 12.0–15.0)
MCH: 36 pg — ABNORMAL HIGH (ref 26.0–34.0)
MCHC: 35.7 g/dL (ref 30.0–36.0)
MCV: 100.8 fL — ABNORMAL HIGH (ref 80.0–100.0)
Platelets: 47 10*3/uL — ABNORMAL LOW (ref 150–400)
RBC: 2.5 MIL/uL — ABNORMAL LOW (ref 3.87–5.11)
RDW: 15.9 % — ABNORMAL HIGH (ref 11.5–15.5)
WBC: 4.8 10*3/uL (ref 4.0–10.5)
nRBC: 1.5 % — ABNORMAL HIGH (ref 0.0–0.2)

## 2019-02-20 LAB — MAGNESIUM: Magnesium: 2.5 mg/dL — ABNORMAL HIGH (ref 1.7–2.4)

## 2019-02-20 MED ORDER — VITAMIN C 500 MG PO TABS
250.0000 mg | ORAL_TABLET | Freq: Two times a day (BID) | ORAL | Status: DC
  Administered 2019-02-20 – 2019-03-08 (×19): 250 mg via ORAL
  Filled 2019-02-20 (×19): qty 1

## 2019-02-20 MED ORDER — MICONAZOLE NITRATE POWD
Freq: Every day | Status: DC
  Filled 2019-02-20: qty 100

## 2019-02-20 MED ORDER — MICONAZOLE NITRATE 2 % EX CREA
TOPICAL_CREAM | Freq: Every day | CUTANEOUS | Status: DC
  Administered 2019-02-20 – 2019-03-14 (×23): via TOPICAL
  Filled 2019-02-20 (×3): qty 14

## 2019-02-20 MED ORDER — VITAMIN B-1 100 MG PO TABS
100.0000 mg | ORAL_TABLET | Freq: Every day | ORAL | Status: DC
  Administered 2019-02-20 – 2019-03-13 (×15): 100 mg via ORAL
  Filled 2019-02-20 (×15): qty 1

## 2019-02-20 MED ORDER — OCUVITE-LUTEIN PO CAPS
1.0000 | ORAL_CAPSULE | Freq: Every day | ORAL | Status: DC
  Administered 2019-02-20: 18:00:00 1 via ORAL
  Filled 2019-02-20 (×3): qty 1

## 2019-02-20 MED ORDER — POTASSIUM CHLORIDE CRYS ER 20 MEQ PO TBCR
20.0000 meq | EXTENDED_RELEASE_TABLET | Freq: Two times a day (BID) | ORAL | Status: DC
  Administered 2019-02-20 (×2): 20 meq via ORAL
  Filled 2019-02-20 (×3): qty 1

## 2019-02-20 MED ORDER — FOLIC ACID 1 MG PO TABS
1.0000 mg | ORAL_TABLET | Freq: Every day | ORAL | Status: DC
  Administered 2019-02-20 – 2019-03-13 (×15): 1 mg via ORAL
  Filled 2019-02-20 (×15): qty 1

## 2019-02-20 MED ORDER — DEXTROSE 5 % IV SOLN
INTRAVENOUS | Status: AC
  Administered 2019-02-20: 10:00:00 via INTRAVENOUS

## 2019-02-20 NOTE — Progress Notes (Signed)
Hematology/Oncology Consult note [facility]  [phone] [phone]  Patient Care Team: Sula RumpleVirk, Charanjit, MD as PCP - General (Family Medicine)   Name of the patient: Claudia ShorterLelia Case  [id number]  02-22-1941   Date of visit: 02/20/2019   Interval history- mouth feels sore. Abdominal pain and nausea has improved   Review of systems- Review of Systems  Constitutional: Positive for malaise/fatigue.  HENT:       Mouth sore      No Known Allergies   Past Medical History:  Diagnosis Date  . Arthritis   . GERD (gastroesophageal reflux disease)   . Hyperlipidemia   . Hypertension   . Psoriatic arthritis [facility](H[facility])      Past Surgical History:  Procedure Laterality Date  . NO PAST SURGERIES      Social History   Socioeconomic History  . Marital status: Widowed    Spouse name: Not on file  . Number of children: Not on file  . Years of education: Not on file  . Highest education level: Not on file  Occupational History  . Not on file  Social Needs  . Financial resource strain: Not hard at all  . Food insecurity    Worry: Never true    Inability: Never true  . Transportation needs    Medical: No    Non-medical: No  Tobacco Use  . Smoking status: Never Smoker  . Smokeless tobacco: Never Used  Substance and Sexual Activity  . Alcohol use: Not Currently  . Drug use: Never  . Sexual activity: Not Currently  Lifestyle  . Physical activity    Days per week: 2 days    Minutes per session: 20 min  . Stress: Not at all  Relationships  . Social Musicianconnections    Talks on phone: Once a week    Gets together: Once a week    Attends religious service: 1 to 4 times per year    Active member of club or organization: No    Attends meetings of clubs or organizations: Never    Relationship status: Widowed  . Intimate partner violence    Fear of current or ex partner: No    Emotionally abused: No    Physically abused: No    Forced  sexual activity: No  Other Topics Concern  . Not on file  Social History Narrative   Lives alone    Family History  Problem Relation Age of Onset  . Breast cancer Sister 79  . CAD Mother   . Bone cancer Father      Current Facility-Administered Medications:  .  0.45 % sodium chloride infusion, , Intravenous, Continuous, Altamese DillingVachhani, Vaibhavkumar, MD, Last Rate: 50 mL/hr at 02/19/19 1938 .  acetaminophen (TYLENOL) tablet 650 mg, 650 mg, Oral, Q6H PRN **OR** acetaminophen (TYLENOL) suppository 650 mg, 650 mg, Rectal, Q6H PRN, Alford HighlandWieting, Richard, MD, 650 mg at 02/17/19 2045 .  acetaminophen (TYLENOL) tablet 650 mg, 650 mg, Oral, Once PRN, Jeanmarie PlantMcShane, James A, MD .  ceFEPIme (MAXIPIME) 2 g in sodium chloride 0.9 % 100 mL IVPB, 2 g, Intravenous, Q12H, Shanlever, Charmayne SheerCharles M, RPH, Last Rate: 200 mL/hr at 02/19/19 2157, 2 g at 02/19/19 2157 .  fluconazole (DIFLUCAN) IVPB 100 mg, 100 mg, Intravenous, Q24H, Wieting, Richard, MD, Last Rate: 50 mL/hr at 02/19/19 1942, 100 mg at 02/19/19 1942 .  leucovorin injection 10 mg, 10 mg, Intravenous, Q12H, Altamese DillingVachhani, Vaibhavkumar, MD, 10 mg at 02/19/19 1932 .  metroNIDAZOLE (FLAGYL)  IVPB 500 mg, 500 mg, Intravenous, Q12H, Ravishankar, Jayashree, MD, Last Rate: 100 mL/hr at 02/20/19 0618, 500 mg at 02/20/19 0618 .  nystatin (MYCOSTATIN/NYSTOP) topical powder, , Topical, BID, Altamese DillingVachhani, Vaibhavkumar, MD .  ondansetron (ZOFRAN) tablet 4 mg, 4 mg, Oral, Q6H PRN **OR** ondansetron (ZOFRAN) injection 4 mg, 4 mg, Intravenous, Q6H PRN, Wieting, Richard, MD .  tamsulosin (FLOMAX) capsule 0.4 mg, 0.4 mg, Oral, Daily, Altamese DillingVachhani, Vaibhavkumar, MD, 0.4 mg at 02/19/19 0832  Physical exam:  Vitals:   02/18/19 2019 02/19/19 0543 02/19/19 1710 02/19/19 1925  BP: (!) 118/55 111/70 (!) 108/52 128/63  Pulse: 93 80 97 93  Resp: 16 16 16    Temp: 98 F (36.7 C) 98.8 F (37.1 C) 97.8 F (36.6 C) (!) 97 F (36.1 C)  TempSrc: Oral Oral Oral Axillary  SpO2: 99% 99% 98% 99%  Weight:       Height:       Physical Exam Constitutional:      Comments: Appears frail and fatigued  HENT:     Head: Normocephalic and atraumatic.     Mouth/Throat:     Comments: Mucositis improving Eyes:     Pupils: Pupils are equal, round, and reactive to light.  Neck:     Musculoskeletal: Normal range of motion.  Cardiovascular:     Rate and Rhythm: Normal rate and regular rhythm.     Heart sounds: Normal heart sounds.  Pulmonary:     Effort: Pulmonary effort is normal.     Breath sounds: Normal breath sounds.  Abdominal:     General: Bowel sounds are normal.     Palpations: Abdomen is soft.  Skin:    General: Skin is warm and dry.  Neurological:     Mental Status: She is alert and oriented to person, place, and time.      CMP Latest Ref Rng & Units 02/20/2019  Glucose 70 - 99 mg/dL 098(J158(H)  BUN 8 - 23 mg/dL 19(J31(H)  Creatinine 4.780.44 - 1.00 mg/dL 2.950.65  Sodium 621135 - 308145 mmol/L 148(H)  Potassium 3.5 - 5.1 mmol/L 3.3(L)  Chloride 98 - 111 mmol/L 118(H)  CO2 22 - 32 mmol/L 21(L)  Calcium 8.9 - 10.3 mg/dL 7.7(L)  Total Protein 6.5 - 8.1 g/dL 4.9(L)  Total Bilirubin 0.3 - 1.2 mg/dL 6.5(H)  Alkaline Phos 38 - 126 U/L 48  AST 15 - 41 U/L 38  ALT 0 - 44 U/L 27   CBC Latest Ref Rng & Units 02/20/2019  WBC 4.0 - 10.5 K/uL 4.8  Hemoglobin 12.0 - 15.0 g/dL 9.0(L)  Hematocrit 36.0 - 46.0 % 25.2(L)  Platelets 150 - 400 K/uL 47(L)    @IMAGES @  Ct Head Wo Contrast  Result Date: 02/16/2019 CLINICAL DATA:  Acute confusion. EXAM: CT HEAD WITHOUT CONTRAST TECHNIQUE: Contiguous axial images were obtained from the base of the skull through the vertex without intravenous contrast. COMPARISON:  Remote head CT and brain MRI December 2010 FINDINGS: Brain: No evidence of acute infarction, hemorrhage, hydrocephalus, extra-axial collection or mass lesion/mass effect. Mild chronic small vessel ischemia. Remote lacunar infarct in the periventricular right frontal lobe. Vascular: Atherosclerosis of skullbase  vasculature without hyperdense vessel or abnormal calcification. Skull: No fracture or focal lesion. Sinuses/Orbits: Paranasal sinuses and mastoid air cells are clear. The visualized orbits are unremarkable. Other: None. IMPRESSION: No acute intracranial abnormality. Electronically Signed   By: Narda RutherfordMelanie  Sanford M.D.   On: 02/16/2019 19:07   [place] Renal  Result Date: 02/18/2019 CLINICAL DATA:  Urinary obstruction.  EXAM: RENAL / URINARY TRACT ULTRASOUND COMPLETE COMPARISON:  None. FINDINGS: Right Kidney: Renal measurements: 9.8 x 4.5 x 3.8 cm = volume: 88 mL . Echogenicity within normal limits. No mass or hydronephrosis visualized. Left Kidney: Renal measurements: 9.3 x 5.1 x 4.2 cm = volume: 106 mL. Echogenicity within normal limits. No mass or hydronephrosis visualized. Bladder: Appears normal for degree of bladder distention. IMPRESSION: Normal renal ultrasound. Electronically Signed   By: Marijo Conception M.D.   On: 02/18/2019 10:10   Dg Chest Port 1 View  Result Date: 02/16/2019 CLINICAL DATA:  Sore throat with vomiting EXAM: PORTABLE CHEST 1 VIEW COMPARISON:  CT 08/03/2009, radiograph 08/01/2009 FINDINGS: No focal airspace opacity or pleural effusion. Mild cardiomegaly. Moderate to large hiatal hernia. IMPRESSION: No active disease.  Hiatal hernia. Electronically Signed   By: Donavan Foil M.D.   On: 02/16/2019 16:51   [place] Ekg Site Rite  Result Date: 02/17/2019 If Site Rite image not attached, placement could not be confirmed due to current cardiac rhythm.  US Abdomen Limited Ruq  Result Date: 02/16/2019 CLINICAL DATA:  78 year old female with elevated bilirubin. EXAM: ULTRASOUND ABDOMEN LIMITED RIGHT UPPER QUADRANT COMPARISON:  08/03/2009 CT FINDINGS: Gallbladder: Gallbladder sludge is noted. There is no evidence of cholelithiasis or acute cholecystitis. Common bile duct: Diameter: 3.9 mm. No evidence of intrahepatic or extrahepatic biliary dilatation. Liver: No focal lesion identified. Within normal  limits in parenchymal echogenicity. Portal vein is patent on color Doppler imaging with normal direction of blood flow towards the liver. IMPRESSION: Gallbladder sludge, otherwise unremarkable RIGHT UPPER quadrant abdominal ultrasound. No evidence of biliary dilatation. Electronically Signed   By: Margarette Canada M.D.   On: 02/16/2019 19:27     Assessment and plan- Patient is a 78 y.o. female admitted for severe pancytopenia and neutropenic fever likely secondary to methotrexate toxicity  1.  Pancytopenia: Anemia work-up completed yesterday and no evidence of iron, B12 deficiency or hypothyroidism.  Reticulocyte count is low indicating hypoproliferative anemia from bone marrow suppression in the setting of methotrexate toxicity.  Hemoglobin and platelet count as well as white count is improving.  Okay to give Neupogen for 1 more day and then stop.  Okay to stop leucovorin at this time  2.  Neutropenic fever: Source is unclear.  She is on broad-spectrum antibiotics.  Afebrile since admission  I will tentatively see her in 3 to 4 weeks after discharge to ensure recovery of counts otherwise she will follow-up with rheumatology.    Visit Diagnosis: Pancytopenia Neutropenic fever   Dr. Randa Evens, MD, MPH [facility] [phone] 02/20/2019 10:51 PM

## 2019-02-20 NOTE — TOC Initial Note (Signed)
Transition of Care [facility]) - Initial/Assessment Note    Patient Details  Name: Claudia Case MRN: [id number] Date of Birth: Jul 12, 1941  Transition of [facility]) CM/SW Contact:    Claudia Hutching, RN Phone Number: 02/20/2019, 9:11 AM  Clinical Narrative:                 Patient admitted with neutropenic fever.  Patient is lying in bed with eyes open, patient answers questions with some difficulty being a little confused.  Patient reports that she lives alone in [place] but she does have a sister that helps her out some.  RNCM reached out to sister yesterday with no answer, will call again today.  Physical therapy has recommended home health and patient seems agreeable.    Expected Discharge Plan: [place]     Patient Goals and CMS Choice Patient states their goals for this hospitalization and ongoing recovery are:: To get better and go home. CMS Medicare.gov Compare Post Acute Care list provided to:: Patient Choice offered to / list presented to : Patient  Expected Discharge Plan and Services Expected Discharge Plan: [place]   Discharge Planning Services: CM Consult Post Acute Care Choice: [place] arrangements for the past 2 months: Single Family Home                                      Prior Living Arrangements/Services Living arrangements for the past 2 months: Single Family Home Lives with:: Self Patient language and need for interpreter reviewed:: No Do you feel safe going back to the place where you live?: Yes      Need for Family Participation in Patient Care: Yes (Comment)(will need support at discharge) Care giver support system in place?: Yes (comment)(sister)   Criminal Activity/Legal Involvement Pertinent to Current Situation/Hospitalization: No - Comment as needed  Activities of Daily Living Home Assistive Devices/Equipment: None ADL Screening (condition at time of admission) Patient's cognitive  ability adequate to safely complete daily activities?: No Is the patient deaf or have difficulty hearing?: No Does the patient have difficulty seeing, even when wearing glasses/contacts?: No Does the patient have difficulty concentrating, remembering, or making decisions?: Yes Patient able to express need for assistance with ADLs?: Yes Does the patient have difficulty dressing or bathing?: No Independently performs ADLs?: Yes (appropriate for developmental age) Does the patient have difficulty walking or climbing stairs?: No Weakness of Legs: None Weakness of Arms/Hands: None  Permission Sought/Granted Permission sought to share information with : Case Manager, Other (comment), Family Supports Permission granted to share information with : Yes, Verbal Permission Granted     Permission granted to share info w AGENCY: Home Health agency of choice  Permission granted to share info w Relationship: sister     Emotional Assessment Appearance:: Appears stated age Attitude/Demeanor/Rapport: Inconsistent Affect (typically observed): Pleasant Orientation: : Oriented to Self Alcohol / Substance Use: Not Applicable Psych Involvement: No (comment)  Admission diagnosis:  Bilirubinemia [E80.6] Fever [R50.9] Patient Active Problem List   Diagnosis Date Noted  . Neutropenic fever ([place]) 02/16/2019   PCP:  Sherrin Daisy, MD Pharmacy:   [id number] - [place], [place] - [address] [address] Phone: [phone] Fax: [phone]     Social Determinants of Health (SDOH) Interventions    Readmission Risk Interventions No flowsheet data found.

## 2019-02-20 NOTE — Progress Notes (Signed)
[facility] [place] NAME: Claudia Case    MR#:  [id number]  DATE OF BIRTH:  Jun 08, 1941  SUBJECTIVE:  CHIEF COMPLAINT:   Chief Complaint  Patient presents with  . Hematuria  . Emesis   Brought with confusion and noted to have pancytopenia and some fever.   No new complaints.  Continue to have pain in mouth and not able to eat much.  Getting oral liquid.  More confused today.  REVIEW OF SYSTEMS:   CONSTITUTIONAL: No fever, have fatigue or weakness.  EYES: No blurred or double vision.  EARS, NOSE, AND THROAT: No tinnitus or ear pain.  RESPIRATORY: No cough, shortness of breath, wheezing or hemoptysis.  CARDIOVASCULAR: No chest pain, orthopnea, edema.  GASTROINTESTINAL: No nausea, vomiting, diarrhea or abdominal pain.  GENITOURINARY: No dysuria, hematuria.  ENDOCRINE: No polyuria, nocturia,  HEMATOLOGY: No anemia, easy bruising or bleeding SKIN: No rash or lesion. MUSCULOSKELETAL: No joint pain or arthritis.   NEUROLOGIC: No tingling, numbness, have generalized weakness.  PSYCHIATRY: No anxiety or depression.   ROS  DRUG ALLERGIES:  No Known Allergies  VITALS:  Blood pressure (!) 148/75, pulse (!) 102, temperature 98.4 F (36.9 C), temperature source Oral, resp. rate 20, height _0  (1.626 m), weight 59.1 kg, SpO2 98 %.  PHYSICAL EXAMINATION:  GENERAL:  78 y.o.-year-old patient lying in the bed with no acute distress.  EYES: Pupils equal, round, reactive to light and accommodation. No scleral icterus. Extraocular muscles intact.  HEENT: Head atraumatic, normocephalic. Oropharynx and nasopharynx clear.  Rashes on tongue and oral mucosa NECK:  Supple, no jugular venous distention. No thyroid enlargement, no tenderness.  LUNGS: Normal breath sounds bilaterally, no wheezing, rales,rhonchi or crepitation. No use of accessory muscles of respiration.  CARDIOVASCULAR: S1, S2 normal. No murmurs, rubs, or gallops.  ABDOMEN: Soft, nontender,  nondistended. Bowel sounds present. No organomegaly or mass.  EXTREMITIES: No pedal edema, cyanosis, or clubbing.  NEUROLOGIC: Cranial nerves II through XII are intact. Muscle strength 4/5 in all extremities. Sensation intact. Gait not checked.  Generalized PSYCHIATRIC: The patient is alert and oriented x 1-2.  SKIN: No obvious rash, lesion, or ulcer.  Ecchymosis on the skin with some breakdown and signs of recent bleed on left forearm.  Physical Exam LABORATORY PANEL:   CBC Recent Labs  Lab 02/20/19 0404  WBC 4.8  HGB 9.0*  HCT 25.2*  PLT 47*   ------------------------------------------------------------------------------------------------------------------  Chemistries  Recent Labs  Lab 02/20/19 0404  NA 148*  K 3.3*  CL 118*  CO2 21*  GLUCOSE 158*  BUN 31*  CREATININE 0.65  CALCIUM 7.7*  MG 2.5*  AST 38  ALT 27  ALKPHOS 48  BILITOT 6.5*   ------------------------------------------------------------------------------------------------------------------  Cardiac Enzymes No results for input(s): TROPONINI in the last 168 hours. ------------------------------------------------------------------------------------------------------------------  RADIOLOGY:  No results found.  ASSESSMENT AND PLAN:   Active Problems:   Neutropenic fever ([place])   1.  Neutropenic fever.  Follow-up blood cultures.  Empiric cefepime.  Since the patient also has thrush ,give empiric Diflucan.  Follow temperature curve. no fever.  Bl cx shows contamination.  Called ID as no source so far on infection.  Suggested to add Flagyl for oral anaerobic coverage for mucositis. 2.  Pancytopenia.  Likely reaction from methotrexate.  Leucovorin IV every 12 hours.  also give Granix to elevate the white count.  Oncology consultation appreciated.   no schistocytes seen on the smear.  Continue to watch platelet count  closely.  Oncology recommended rheumatology consultation, if her counts does not improve  in the next 2 to 3 weeks then they might do bone marrow biopsy.   Her platelet count dropped to 10,000 - s/p 1 U plt transfusion 02/17/19-stable now.  her counts are improving now-stop leucovorin. 3.  Jaundice with elevated bilirubin.  Likely also reaction from methotrexate.  Right upper quadrant sonogram - no acute findings. 4.  Hyponatremia.  Gentle IV fluids with normal saline-improved some.  This could be the reason for confusion.  Sodium level is actually 148, change IV fluid to D5W.. 5.  Thrush.  IV Diflucan. 6.  Confusion.  CT scan of the head done- no active findings / bleed. Improved now. She might have water-soluble vitamins deficiency, will give thiamine, folic acid, vitamin C, multivitamin tablets. 7.  Reported diarrhea send off stool studies- no BM since admisison, d/c contact isolation. 8.  Hyperlipidemia unspecified.  Hold statin with elevated bilirubin 9.  GERD.  Hold PPI 10.  Stage I decubiti on the sacral area with just redness on the skin  Physical therapy evaluation to help discharge planning.  All the records are reviewed and case discussed with Care Management/Social Workerr. Management plans discussed with the patient, family and they are in agreement.  CODE STATUS: Full code.  TOTAL TIME TAKING CARE OF THIS PATIENT: *35 minutes.   I discussed the case with oncologist.   POSSIBLE D/C IN 2-3 DAYS, DEPENDING ON CLINICAL CONDITION.   [name] M.D on 02/20/2019   Between 7am to 6pm - Pager - [phone]  After 6pm go to [email or web address] - password [email or web address] [facility]  Office  [phone]  CC: Primary care physician; Sherrin Daisy, MD  Note: This dictation was prepared with Dragon dictation along with smaller phrase technology. Any transcriptional errors that result from this process are unintentional.

## 2019-02-20 NOTE — Progress Notes (Signed)
ID No fever She is stable with no additional issues    Patient Vitals for the past 24 hrs:  BP Temp Temp src Pulse Resp SpO2  02/19/19 1925 128/63 (!) 97 F (36.1 C) Axillary 93 - 99 %  02/19/19 1710 (!) 108/52 97.8 F (36.6 C) Oral 97 16 98 %   Awake and alert Mucositis mouth improving  CBC Latest Ref Rng & Units 02/20/2019 02/19/2019 02/18/2019  WBC 4.0 - 10.5 K/uL 4.8 2.1(L) 0.9(LL)  Hemoglobin 12.0 - 15.0 g/dL 9.0(L) 8.1(L) 7.8(L)  Hematocrit 36.0 - 46.0 % 25.2(L) 23.4(L) 21.8(L)  Platelets 150 - 400 K/uL 47(L) 24(LL) 28(LL)    CMP Latest Ref Rng & Units 02/20/2019 02/19/2019 02/18/2019  Glucose 70 - 99 mg/dL 158(H) 120(H) 106(H)  BUN 8 - 23 mg/dL 31(H) 26(H) 24(H)  Creatinine 0.44 - 1.00 mg/dL 0.65 0.47 0.54  Sodium 135 - 145 mmol/L 148(H) 144 138  Potassium 3.5 - 5.1 mmol/L 3.3(L) 3.5 3.3(L)  Chloride 98 - 111 mmol/L 118(H) 114(H) 110  CO2 22 - 32 mmol/L 21(L) 20(L) 19(L)  Calcium 8.9 - 10.3 mg/dL 7.7(L) 7.5(L) 7.4(L)  Total Protein 6.5 - 8.1 g/dL 4.9(L) 4.9(L) 4.8(L)  Total Bilirubin 0.3 - 1.2 mg/dL 6.5(H) 6.9(H) 7.1(H)  Alkaline Phos 38 - 126 U/L 48 42 35(L)  AST 15 - 41 U/L 38 16 24  ALT 0 - 44 U/L 27 22 22     Fever has resolved in a patient with pancytopenia including neutropenia ( has resolved)  with severe mucositis due to methotrexate toxicity. On cefepime and flagyl- can be discontinued   Coag neg staph 1 of 4 bottle is a skin contaminant- no treatment needed   Methotrexate toxicitycausing pancytopenia due to bone marrow suppression, abnormal LFTS, fever Neutropenia improving, so is fever  Rash over the arms, neck , front of chest suggestive of photosensitivity ? Lupus. It is atypical for psoriasis  ID will sign off

## 2019-02-20 NOTE — Progress Notes (Signed)
Physical Therapy Treatment Patient Details Name: Taiwana Willison MRN: [id number] DOB: [birth year] Today's Date: 02/20/2019    History of Present Illness Patient is a 78 year old female that presents with weakness, mouth sores, pain in her hands, and nausea/vomiting. She was found to have pancytopenia, elevated bilirubin levels. She has a history of RA, which has been managed with methotrexate.    PT Comments    Asked to see pt today as nursing reports concerns over discharge plan and pt's ability to care for herself at home.  Pt lightly sleeping upon arrival with blankets pulled up around her.  Pt refused therapy stating she is awaiting her family to come and get her.  Pt strongly encouraged to get up and walk with writer and she eventually agrees with min tactile cues.  She is able to get to edge of bed with min a x 1.  Stands with min a x 1 and is able to walk around small nursing pod with walker and min a x 1 to assist with walker navigation and general safety.  She needs encouragement to continue gait as she asks to sit on the bench in the hallway and looks into patient rooms for chairs.  She returns to her room and is encouraged to remain in recliner and given lunch tray.   Pt demonstrates continued confusion.  She did not know she was in the hospital and seemed surprised when I told her so.  She kept stating she was waiting and looking for her family "They are on their way to get me."  She also has decreased safety awareness with assistive devices.  Cognition is primary barrier for discharge at this time making her unsafe to return home without 24 hour care.  Pt has a sister but it does not seem she is overly involved in her care.  Pt would have difficulty managing her medication, medical and dietary needs.  Will adjust discharge recommendations to SNF as pt is not at her baseline level of care.   Follow Up Recommendations  SNF;Supervision for mobility/OOB     Equipment Recommendations  Rolling walker with 5" wheels    Recommendations for Other Services       Precautions / Restrictions      Mobility  Bed Mobility Overal bed mobility: Needs Assistance Bed Mobility: Supine to Sit     Supine to sit: Min assist     General bed mobility comments: Needed physical cues to transfer out of bed.  Pt resisting curled up in bed with blankets waiting on her family.  Transfers Overall transfer level: Needs assistance Equipment used: Rolling walker (2 wheeled) Transfers: Sit to/from Stand Sit to Stand: Min assist            Ambulation/Gait Ambulation/Gait assistance: Min Web designer (Feet): 160 Feet Assistive device: Rolling walker (2 wheeled) Gait Pattern/deviations: Step-through pattern;Decreased step length - right;Decreased step length - left;Trunk flexed Gait velocity: decreased   General Gait Details: Difficulty navigating walker around objects and overall safety awareness with transfers   Stairs             Wheelchair Mobility    Modified Rankin (Stroke Patients Only)       Balance Overall balance assessment: Needs assistance Sitting-balance support: Feet supported Sitting balance-Leahy Scale: Fair     Standing balance support: Bilateral upper extremity supported Standing balance-Leahy Scale: Fair  Cognition Arousal/Alertness: Lethargic Behavior During Therapy: WFL for tasks assessed/performed;Agitated Overall Cognitive Status: Impaired/Different from baseline                                 General Comments: Pt confused, did not know she was in the hospital and was waiting for her family to pick her up.  Needed max encouragement to get out of bed, walk etc.      Exercises      General Comments        Pertinent Vitals/Pain Pain Assessment: No/denies pain    Home Living                      Prior Function            PT Goals (current goals can now  be found in the care plan section) Progress towards PT goals: Progressing toward goals    Frequency    Min 2X/week      PT Plan Discharge plan needs to be updated    Co-evaluation              AM-PAC PT "6 Clicks" Mobility   Outcome Measure  Help needed turning from your back to your side while in a flat bed without using bedrails?: A Little Help needed moving from lying on your back to sitting on the side of a flat bed without using bedrails?: A Little Help needed moving to and from a bed to a chair (including a wheelchair)?: A Little Help needed standing up from a chair using your arms (e.g., wheelchair or bedside chair)?: A Little Help needed to walk in hospital room?: A Little Help needed climbing 3-5 steps with a railing? : A Little 6 Click Score: 18    End of Session Equipment Utilized During Treatment: Gait belt Activity Tolerance: Patient tolerated treatment well Patient left: in chair;with call bell/phone within reach;with chair alarm set Nurse Communication: Other (comment)       Time: 1610-9604: 1317-1332 PT Time Calculation (min) (ACUTE ONLY): 15 min  Charges:  $Gait Training: 8-22 mins                     Danielle DessSarah Celso Granja, PTA 02/20/19, 2:09 PM

## 2019-02-21 ENCOUNTER — Inpatient Hospital Stay: Payer: Medicare HMO

## 2019-02-21 LAB — COMP PANEL: LEUKEMIA/LYMPHOMA

## 2019-02-21 LAB — CBC
HCT: 22.9 % — ABNORMAL LOW (ref 36.0–46.0)
Hemoglobin: 7.7 g/dL — ABNORMAL LOW (ref 12.0–15.0)
MCH: 34.5 pg — ABNORMAL HIGH (ref 26.0–34.0)
MCHC: 33.6 g/dL (ref 30.0–36.0)
MCV: 102.7 fL — ABNORMAL HIGH (ref 80.0–100.0)
Platelets: 99 10*3/uL — ABNORMAL LOW (ref 150–400)
RBC: 2.23 MIL/uL — ABNORMAL LOW (ref 3.87–5.11)
RDW: 16.7 % — ABNORMAL HIGH (ref 11.5–15.5)
WBC: 6.8 10*3/uL (ref 4.0–10.5)
nRBC: 2.8 % — ABNORMAL HIGH (ref 0.0–0.2)

## 2019-02-21 LAB — COMPREHENSIVE METABOLIC PANEL
ALT: 34 U/L (ref 0–44)
AST: 52 U/L — ABNORMAL HIGH (ref 15–41)
Albumin: 1.9 g/dL — ABNORMAL LOW (ref 3.5–5.0)
Alkaline Phosphatase: 55 U/L (ref 38–126)
Anion gap: 6 (ref 5–15)
BUN: 25 mg/dL — ABNORMAL HIGH (ref 8–23)
CO2: 22 mmol/L (ref 22–32)
Calcium: 7.5 mg/dL — ABNORMAL LOW (ref 8.9–10.3)
Chloride: 118 mmol/L — ABNORMAL HIGH (ref 98–111)
Creatinine, Ser: 0.51 mg/dL (ref 0.44–1.00)
GFR calc Af Amer: >60 mL/min (ref >60)
GFR calc non Af Amer: >60 mL/min (ref >60)
Glucose, Bld: 174 mg/dL — ABNORMAL HIGH (ref 70–99)
Potassium: 3.6 mmol/L (ref 3.5–5.1)
Sodium: 146 mmol/L — ABNORMAL HIGH (ref 135–145)
Total Bilirubin: 3.6 mg/dL — ABNORMAL HIGH (ref 0.3–1.2)
Total Protein: 4.4 g/dL — ABNORMAL LOW (ref 6.5–8.1)

## 2019-02-21 LAB — URINALYSIS, COMPLETE (UACMP) WITH MICROSCOPIC
Bacteria, UA: NONE SEEN
Glucose, UA: NEGATIVE mg/dL
Ketones, ur: 5 mg/dL — AB
Leukocytes,Ua: NEGATIVE
Nitrite: NEGATIVE
Protein, ur: 30 mg/dL — AB
Specific Gravity, Urine: 1.03 (ref 1.005–1.030)
pH: 6 (ref 5.0–8.0)

## 2019-02-21 MED ORDER — POTASSIUM CHLORIDE 10 MEQ/100ML IV SOLN
10.0000 meq | INTRAVENOUS | Status: AC
  Administered 2019-02-21 (×3): 10 meq via INTRAVENOUS
  Filled 2019-02-21 (×3): qty 100

## 2019-02-21 MED ORDER — SODIUM CHLORIDE 0.9% FLUSH: 10.0000 mL | INTRAVENOUS | Status: DC | PRN

## 2019-02-21 MED ORDER — DEXTROSE 5 % IV SOLN
INTRAVENOUS | Status: DC
  Administered 2019-02-21 – 2019-02-24 (×4): via INTRAVENOUS

## 2019-02-21 MED ORDER — LIDOCAINE VISCOUS HCL 2 % MT SOLN
15.0000 mL | Freq: Four times a day (QID) | OROMUCOSAL | Status: DC | PRN
  Administered 2019-02-24 – 2019-03-05 (×2): 15 mL via OROMUCOSAL
  Filled 2019-02-21 (×4): qty 15

## 2019-02-21 NOTE — Care Management Important Message (Signed)
Important Message  Patient Details  Name: Claudia Case MRN: [id number] Date of Birth: Jan 10, 1941   Medicare Important Message Given:  Yes     [name] 02/21/2019, 11:18 AM

## 2019-02-21 NOTE — Evaluation (Signed)
Occupational Therapy Evaluation Patient Details Name: Claudia Case MRN: [id number] DOB: February 12, 1941 Today's Date: 02/21/2019    History of Present Illness Patient is a 78 year old female that presents with weakness, mouth sores, pain in her hands, and nausea/vomiting. She was found to have pancytopenia, elevated bilirubin levels. She has a history of RA, which has been managed with methotrexate.   Clinical Impression   Pt. presents with cognitive changes/confusion, weakness, limited activity tolerance, and limited functional mobility which limits her ability to complete basic ADL and IADL functioning. Pt. resides at home with her sister. Pt. Reports independence with ADL, and IADL tasks, meal preparation, and medication management. Pt. does not drive. Pt. presented with confusion, requiring cognitive assist, cues during the session, and for simple one step ADL tasks. Upon arrival, Pt.'s gown was very tangled. Pt. Was assisted with untangling, and donning her gown. Pt. Could benefit from OT services for ADL training, A/E training, and pt. Education about energy conservation, cognitive compensatory strategies, home modification, and DME. Pt. would benefitt from SNF level of care upon discharge. Pt. Could benefit from follow-up OT services at discharge.    Follow Up Recommendations  SNF    Equipment Recommendations       Recommendations for Other Services       Precautions / Restrictions Precautions Precautions: Fall Restrictions Weight Bearing Restrictions: No      Mobility Bed Mobility Overal bed mobility: Needs Assistance       Supine to sit: Min assist        Transfers Overall transfer level: Needs assistance   Transfers: Sit to/from Stand Sit to Stand: Min assist         General transfer comment: Mobility per PT    Balance                                           ADL either performed or assessed with clinical judgement   ADL Overall ADL's  : Needs assistance/impaired     Grooming: Moderate assistance Grooming Details (indicate cue type and reason): Cognitive cues Upper Body Bathing: Maximal assistance   Lower Body Bathing: Moderate assistance   Upper Body Dressing : Moderate assistance   Lower Body Dressing: Maximal assistance                       Vision Baseline Vision/History: No visual deficits Patient Visual Report: No change from baseline       Perception     Praxis      Pertinent Vitals/Pain Pain Assessment: No/denies pain     Hand Dominance Right   Extremity/Trunk Assessment Upper Extremity Assessment Upper Extremity Assessment: Generalized weakness(Arthritic changes in UEs, and left 2nd digit. Right 5th digit contracture.)           Communication Communication Communication: No difficulties   Cognition Arousal/Alertness: Awake/alert Behavior During Therapy: Flat affect Overall Cognitive Status: Impaired/Different from baseline                                 General Comments: Pt. presetns with confusion.   General Comments       Exercises     Shoulder Instructions      Home Living Family/patient expects to be discharged to:: Private residence Living Arrangements: Other relatives(sister) Available Help at Discharge: Available  PRN/intermittently Type of Home: House Home Access: Level entry     Home Layout: One level               Home Equipment: None   Additional Comments: Pt. reports independence with ADLS, IADLS, light meal prep, medication management. Pt. was not driving.      Prior Functioning/Environment Level of Independence: Independent                 OT Problem List: Decreased strength;Impaired UE functional use      OT Treatment/Interventions: Self-care/ADL training;Therapeutic exercise;DME and/or AE instruction;Therapeutic activities;Patient/family education    OT Goals(Current goals can be found in the care plan section)  Acute Rehab OT Goals Patient Stated Goal: To return home OT Goal Formulation: With patient Potential to Achieve Goals: Good  OT Frequency: Min 2X/week   Barriers to D/C:            Co-evaluation              AM-PAC OT "6 Clicks" Daily Activity     Outcome Measure Help from another person eating meals?: A Little Help from another person taking care of personal grooming?: A Lot Help from another person toileting, which includes using toliet, bedpan, or urinal?: A Lot Help from another person bathing (including washing, rinsing, drying)?: A Lot Help from another person to put on and taking off regular upper body clothing?: A Lot Help from another person to put on and taking off regular lower body clothing?: A Lot 6 Click Score: 13   End of Session Equipment Utilized During Treatment: Gait belt  Activity Tolerance: Patient tolerated treatment well Patient left: in bed  OT Visit Diagnosis: Muscle weakness (generalized) (M62.81)                Time: 3785-8850 OT Time Calculation (min): 20 min Charges:  OT General Charges $OT Visit: 1 Visit OT Evaluation $OT Eval Moderate Complexity: 1 Mod  Harrel Carina, MS, OTR/L  Harrel Carina 02/21/2019, 12:28 PM

## 2019-02-21 NOTE — Progress Notes (Signed)
[employer] [facility] - [place] [facility]   PATIENT NAME: Claudia ShorterLelia Bilello    MR#:  [id number]  DATE OF BIRTH:  1940/09/07  SUBJECTIVE:  CHIEF COMPLAINT:   Chief Complaint  Patient presents with  . Hematuria  . Emesis   Brought with confusion and noted to have pancytopenia and some fever.   No new complaints.  Continue to have pain in mouth and not able to eat much.  Getting oral liquid.  More confused today.  REVIEW OF SYSTEMS:   CONSTITUTIONAL: No fever, have fatigue or weakness.  EYES: No blurred or double vision.  EARS, NOSE, AND THROAT: No tinnitus or ear pain.  RESPIRATORY: No cough, shortness of breath, wheezing or hemoptysis.  CARDIOVASCULAR: No chest pain, orthopnea, edema.  GASTROINTESTINAL: No nausea, vomiting, diarrhea or abdominal pain.  GENITOURINARY: No dysuria, hematuria.  ENDOCRINE: No polyuria, nocturia,  HEMATOLOGY: No anemia, easy bruising or bleeding SKIN: No rash or lesion. MUSCULOSKELETAL: No joint pain or arthritis.   NEUROLOGIC: No tingling, numbness, have generalized weakness.  PSYCHIATRY: No anxiety or depression.   ROS  DRUG ALLERGIES:  No Known Allergies  VITALS:  Blood pressure 105/79, pulse 97, temperature 97.9 F (36.6 C), resp. rate 18, height 5\' 4"  (1.626 m), weight 59.1 kg, SpO2 100 %.  PHYSICAL EXAMINATION:  GENERAL:  78 y.o.-year-old patient lying in the bed with no acute distress.  EYES: Pupils equal, round, reactive to light and accommodation. No scleral icterus. Extraocular muscles intact.  HEENT: Head atraumatic, normocephalic. Oropharynx and nasopharynx clear.  Rashes on tongue and oral mucosa NECK:  Supple, no jugular venous distention. No thyroid enlargement, no tenderness.  LUNGS: Normal breath sounds bilaterally, no wheezing, rales,rhonchi or crepitation. No use of accessory muscles of respiration.  CARDIOVASCULAR: S1, S2 normal. No murmurs, rubs, or gallops.  ABDOMEN: Soft, nontender, nondistended. Bowel sounds  present. No organomegaly or mass.  EXTREMITIES: No pedal edema, cyanosis, or clubbing.  NEUROLOGIC: Cranial nerves II through XII are intact. Muscle strength 4/5 in all extremities. Sensation intact. Gait not checked.  Generalized PSYCHIATRIC: The patient is alert and oriented x 1-2.  SKIN: No obvious rash, lesion, or ulcer.  Ecchymosis on the skin with some breakdown and signs of recent bleed on left forearm.  Physical Exam LABORATORY PANEL:   CBC Recent Labs  Lab 02/21/19 0645  WBC 6.8  HGB 7.7*  HCT 22.9*  PLT 99*   ------------------------------------------------------------------------------------------------------------------  Chemistries  Recent Labs  Lab 02/20/19 0404 02/21/19 0645  NA 148* 146*  K 3.3* 3.6  CL 118* 118*  CO2 21* 22  GLUCOSE 158* 174*  BUN 31* 25*  CREATININE 0.65 0.51  CALCIUM 7.7* 7.5*  MG 2.5*  --   AST 38 52*  ALT 27 34  ALKPHOS 48 55  BILITOT 6.5* 3.6*   ------------------------------------------------------------------------------------------------------------------  Cardiac Enzymes No results for input(s): TROPONINI in the last 168 hours. ------------------------------------------------------------------------------------------------------------------  RADIOLOGY:  No results found.  ASSESSMENT AND PLAN:   Active Problems:   Neutropenic fever ([facility])   1.  Neutropenic fever.  Follow-up blood cultures.  Empiric cefepime.  Since the patient also has thrush ,give empiric Diflucan.  Follow temperature curve. no fever.  Bl cx shows contamination.  ID  Suggested to add Flagyl for oral anaerobic coverage for mucositis. 2.  Pancytopenia.  Likely reaction from methotrexate.  Leucovorin IV every 12 hours.  also give Granix to elevate the white count.  Oncology consultation appreciated.   no schistocytes seen on the smear.  Continue  to watch platelet count closely.  Oncology recommended rheumatology consultation,  Her platelet count  dropped to 10,000 - s/p 1 U plt transfusion 02/17/19-stable now.  her counts are improving now-stop leucovorin.  Follow in oncology clinic in 2 to 3 weeks. 3.  Jaundice with elevated bilirubin.  Likely also reaction from methotrexate.  Right upper quadrant sonogram - no acute findings.  Liver functions are improving gradually. 4.  Hyponatremia.  Gentle IV fluids with normal saline-improved some.  This could be the reason for confusion.  Sodium level is actually 148 now, change IV fluid to D5W.. Sodium level is coming down now. 5.  Thrush.  IV Diflucan. 6.  Confusion.  CT scan of the head done- no active findings / bleed. Improved now. She might have water-soluble vitamins deficiency, will give thiamine, folic acid, vitamin C, multivitamin tablets. We will do a repeat CT head today.  UA was negative. 7.  Reported diarrhea send off stool studies- no BM since admisison, d/c contact isolation. 8.  Hyperlipidemia unspecified.  Hold statin with elevated bilirubin-may resume in the next few days. 9.  GERD.  Hold PPI 10.  Stage I decubiti on the sacral area with just redness on the skin  Physical therapy evaluation to help discharge planning.  All the records are reviewed and case discussed with Care Management/Social Workerr. Management plans discussed with the patient, family and they are in agreement.  CODE STATUS: Full code.  TOTAL TIME TAKING CARE OF THIS PATIENT: *35 minutes.   Patient has poor oral intake and progressive weakness so PT suggested SNF.  Sister want to take her home with 2 young granddaughters staying with her after discharge. Patient still does not have enough oral intake and requiring IV fluids.  We will give oral lidocaine to help some with thrush and wait for improvement in her oral intake.  POSSIBLE D/C IN 2-3 DAYS, DEPENDING ON CLINICAL CONDITION.   [name] M.D on 02/21/2019   Between 7am to 6pm - Pager - [phone]  After 6pm go to [email or web address] -  password [email or web address] [facility]  Office  [phone]  CC: Primary care physician; Sherrin Daisy, MD  Note: This dictation was prepared with Dragon dictation along with smaller phrase technology. Any transcriptional errors that result from this process are unintentional.

## 2019-02-21 NOTE — TOC Progression Note (Signed)
Transition of Care [facility]) - Progression Note    Patient Details  Name: Allante Beane MRN: [id number] Date of Birth: 28-Jun-1941  Transition of Care [facility]) CM/SW Contact  Shelbie Hutching, RN Phone Number: 02/21/2019, 8:37 AM  Clinical Narrative:    Patient is lying in bed this morning with eyes closed.  Patient awakens to voice and is able to talk with sister [name] on the phone.  RNCM called [name] this am and put the phone on speaker so patient, [name] and RNCM could talk.  Patient is still confused.  Physical therapy recommended SNF yesterday, sister would rather patient be able to go home.  Sister reports that she lives next door and will check on her throughout the day and her twin granddaughters have agreed to stay with the patient when she gets home.  RNCM will arrange home health services.  Patient and sister do not have a preference of agency, Floydene Flock with [facility] given referral.  Patient will need a walker and bedside commode, Sunday Corn with [employer] will provide equipment.  RNCM will follow until discharge.    Expected Discharge Plan: [place]    Expected Discharge Plan and Services Expected Discharge Plan: [place]   Discharge Planning Services: CM Consult Post Acute Care Choice: [place] arrangements for the past 2 months: Single Family Home                                       Social Determinants of Health (SDOH) Interventions    Readmission Risk Interventions No flowsheet data found.

## 2019-02-22 LAB — BASIC METABOLIC PANEL
Anion gap: 4 — ABNORMAL LOW (ref 5–15)
BUN: 24 mg/dL — ABNORMAL HIGH (ref 8–23)
CO2: 24 mmol/L (ref 22–32)
Calcium: 7.5 mg/dL — ABNORMAL LOW (ref 8.9–10.3)
Chloride: 118 mmol/L — ABNORMAL HIGH (ref 98–111)
Creatinine, Ser: 0.6 mg/dL (ref 0.44–1.00)
GFR calc Af Amer: >60 mL/min (ref >60)
GFR calc non Af Amer: >60 mL/min (ref >60)
Glucose, Bld: 168 mg/dL — ABNORMAL HIGH (ref 70–99)
Potassium: 4 mmol/L (ref 3.5–5.1)
Sodium: 146 mmol/L — ABNORMAL HIGH (ref 135–145)

## 2019-02-22 LAB — CBC
HCT: 23.2 % — ABNORMAL LOW (ref 36.0–46.0)
Hemoglobin: 7.8 g/dL — ABNORMAL LOW (ref 12.0–15.0)
MCH: 34.5 pg — ABNORMAL HIGH (ref 26.0–34.0)
MCHC: 33.6 g/dL (ref 30.0–36.0)
MCV: 102.7 fL — ABNORMAL HIGH (ref 80.0–100.0)
Platelets: 151 10*3/uL (ref 150–400)
RBC: 2.26 MIL/uL — ABNORMAL LOW (ref 3.87–5.11)
RDW: 17.4 % — ABNORMAL HIGH (ref 11.5–15.5)
WBC: 9.5 10*3/uL (ref 4.0–10.5)
nRBC: 4.1 % — ABNORMAL HIGH (ref 0.0–0.2)

## 2019-02-22 MED ORDER — ADULT MULTIVITAMIN W/MINERALS CH
1.0000 | ORAL_TABLET | Freq: Every day | ORAL | Status: DC
  Administered 2019-02-25 – 2019-03-13 (×14): 1 via ORAL
  Filled 2019-02-22 (×13): qty 1

## 2019-02-22 MED ORDER — LIDOCAINE VISCOUS HCL 2 % MT SOLN
5.0000 mL | Freq: Four times a day (QID) | OROMUCOSAL | Status: DC | PRN
  Administered 2019-02-23 – 2019-03-09 (×5): 5 mL via OROMUCOSAL
  Filled 2019-02-22 (×6): qty 15

## 2019-02-22 MED ORDER — ENSURE ENLIVE PO LIQD
237.0000 mL | Freq: Two times a day (BID) | ORAL | Status: DC
  Administered 2019-02-23 – 2019-02-25 (×5): 237 mL via ORAL

## 2019-02-22 MED ORDER — B COMPLEX-C PO TABS
1.0000 | ORAL_TABLET | Freq: Every day | ORAL | Status: DC
  Administered 2019-02-25 – 2019-03-13 (×13): 1 via ORAL
  Filled 2019-02-22 (×20): qty 1

## 2019-02-22 MED ORDER — PRO-STAT SUGAR FREE PO LIQD
30.0000 mL | Freq: Three times a day (TID) | ORAL | Status: DC
  Administered 2019-02-23 – 2019-02-25 (×4): 30 mL via ORAL

## 2019-02-22 MED ORDER — MAGIC MOUTHWASH
5.0000 mL | Freq: Four times a day (QID) | ORAL | Status: DC | PRN
  Administered 2019-02-25 – 2019-03-09 (×4): 5 mL via ORAL
  Filled 2019-02-22 (×4): qty 5

## 2019-02-22 NOTE — Progress Notes (Signed)
Patient is now refusing anything by mouth due to discomfort and burning. Madlyn Frankel, RN

## 2019-02-22 NOTE — Progress Notes (Signed)
Initial Nutrition Assessment  DOCUMENTATION CODES:   Not applicable  INTERVENTION:   Ensure Enlive po BID, each supplement provides 350 kcal and 20 grams of protein  Magic cup TID with meals, each supplement provides 290 kcal and 9 grams of protein  MVI daily   B-complex with C po daily   Vitamin C 250mg  po BID  Check niacin, B6 and vitamin C labs  Pt likely at high refeed risk; recommend monitor K, Mg and P labs daily once oral intake improves.   NUTRITION DIAGNOSIS:   Inadequate oral intake related to acute illness(thrush, nausea, vomiting) as evidenced by meal completion < 25%.  GOAL:   Patient will meet greater than or equal to 90% of their needs  MONITOR:   PO intake, Supplement acceptance, Labs, Weight trends, Skin, I & O's  REASON FOR ASSESSMENT:   Consult Assessment of nutrition requirement/status  ASSESSMENT:   78 y.o. female with a history of psoriatic arthritis on methotrexate presented to the emergency room on 02/16/2019 with 1 week history of sore throat, difficulty swallowing, blood in urine and rash.  RD working remotely.  Unable to speak with pt today as pt with confusion and is a poor historian. Per chart review, pt with poor appetite pta r/t mouth pain (thrush), dysphagia, nausea and vomiting. Pt continues to have poor appetite and oral intake today; pt refusing all meds and supplements. Pt with diarrhea and rash on arms and legs. Will check B6, niacin and vitamin C labs to r/o deficiency. Pt on methotrexate; pt noted to have macrocytic anemia. Folate and B12 labs wnl. RD will add supplements and vitamins to help pt meet her estimated needs. Would recommend giving multivitamin IV until patient is able to take po meds again.   Per chart, pt with 12lb(8%) weight loss over the past 10 months; this is not significant.   Pt at high risk for malnutrition but unable to diagnose at this time as NFPE cannot be performed.     Medications reviewed and include:  folic acid, thiamine, vitamin C, diflucan   Labs reviewed: Na 146(H), BUN 24(H), Ca 7.5(L) Mg 2.5(H)- 6/24 Iron 111, TIBC 111, ferritin 2143, folate 44.3, B12 1836(H) Hgb 7.8(L), Hct 23.2(L), MCV 102.7(H), MCH 34.5(H) cbgs- 174, 168 x 24 hrs  Unable to complete Nutrition-Focused physical exam at this time.   Diet Order:   Diet Order            DIET SOFT Room service appropriate? Yes; Fluid consistency: Thin  Diet effective now             EDUCATION NEEDS:   Not appropriate for education at this time  Skin:  Skin Assessment: Reviewed RN Assessment(ecchymosis, rash, Stage I sacrum)  Last BM:  diarrhea  Height:   Ht Readings from Last 1 Encounters:  02/16/19 5\' 4"  (1.626 m)    Weight:   Wt Readings from Last 1 Encounters:  02/16/19 59.1 kg    Ideal Body Weight:  54.5 kg  BMI:  Body mass index is 22.35 kg/m.  Estimated Nutritional Needs:   Kcal:  1400-1600kcal/day  Protein:  70-80g/day  Fluid:  >1.4L/day  Koleen Distance MS, RD, LDN Pager #- [phone] Office#- [phone] After Hours Pager: [phone]

## 2019-02-22 NOTE — Evaluation (Addendum)
Clinical/Bedside Swallow Evaluation Patient Details  Name: Claudia Case MRN: [id number] Date of Birth: 1941-04-28  Today's Date: 02/22/2019 Time: SLP Start Time (ACUTE ONLY): 70 SLP Stop Time (ACUTE ONLY): 1140 SLP Time Calculation (min) (ACUTE ONLY): 50 min  Past Medical History:  Past Medical History:  Diagnosis Date  . Arthritis   . GERD (gastroesophageal reflux disease)   . Hyperlipidemia   . Hypertension   . Psoriatic arthritis [facility])    Past Surgical History:  Past Surgical History:  Procedure Laterality Date  . NO PAST SURGERIES     HPI:  Pt is a 78 y/o female who admitted to the ED w/ tongue and lips burning and not able to eat much; she wanted to get checked out. Patient stated she had some nausea and vomiting. She seemed a little confused and was not the greatest historian. Pt has a known history of rheumatoid arthritis on methotrexate; GERD. She endorsed having this same issue w/ mouth soreness and peeling ~1 year ago but could not give details. Pt admitted w/ pancytopenia and some fever; mouth pain. MD endorses probable pancytopenia was likely a reaction to the methotrexate she has been on, along with some jaundice. She has been having very poor oral intake and been on some medications for thrush with no benefit.   Assessment / Plan / Recommendation Clinical Impression  Pt appears to present w/ oral phase dysphagia secondary to oral erythema - pt appears to present w/ a painful oral cavity; oozing presentation and severe tenderness. Pt was reluctant to participate to take po's d/t discomfort; unable to complete a formal OM exam d/t reduced mouth opening. Excoriation of the lips and gum noted; extreme tenderness. Pt exhibits min confusion also. She did endorse having this occur prior ~1 year ago but could not given details.  Pt demonstrated lingual/oral movements to accept po trials but min reduced labial closure on straw/spoon d/t tenderness. Pt consumed few trials of thin  liquids w/ no immediate, overt s/s of aspiration noted. No decline vocal quality or respiratory status noted during/post po trials. Laryngeal excursion appeared WFL during the swallow. During the oral phase, pt exhibited reduced labial and oral movements in bolus acceptance from straw/spoon. She exhibited a negative reaction and report of pain w/ certain trials of po liquids and puree - she best accepted and tolerated sips of cold water and juice w/ SLP. She cleared orally w/ no anterior spillage noted. During the trials of puree, pt was encouraged to swallow the trials but instead was noted to want to expectorate the trials vs manipulate them posteriorly for swallowing. She endorsed pain and discomfort.  Pt was able to feed self w/ support; min confusion and distraction noted.  As pt appears to be most comfortable accepting po's of thin liquids, recommend more of a full liquid diet in light of the oral phase discomfort from the oral erythema. Suspect any oral phase dysphagia is directly impacted by the oral discomfort. Recommend modifying the diet to a Full Liquid; general aspiration precautions; pills crushed in puree/Nectar liquids; ENT f/u for assessment and management. Dietician f/u is recommended for support; suspect pt is not going to eat/drink sufficiently to meet needs w/ the current oral erythema. Recommend frequent oral hygiene and care.  SLP Visit Diagnosis: Dysphagia, oral phase (R13.11)    Aspiration Risk  Mild aspiration risk;Risk for inadequate nutrition/hydration    Diet Recommendation  Full liquid diet; clear liquids. General aspiration precautions; frequent oral care for hygiene.  Medication Administration:  Crushed with puree(or mixed in a Nectar drink)    Other  Recommendations Recommended Consults: Consider ENT evaluation(Dietician f/u) Oral Care Recommendations: Oral care BID;Oral care before and after PO;Staff/trained caregiver to provide oral care Other Recommendations: (n/a at  this time)   Follow up Recommendations None(TBD)      Frequency and Duration            Prognosis Prognosis for Safe Diet Advancement: Fair Barriers to Reach Goals: Severity of deficits Barriers/Prognosis Comment: (mouth soreness, pain)      Swallow Study   General Date of Onset: 02/16/19 HPI: (Pt is a 78 y/o female who admitted to the ED. ) Type of Study: Bedside Swallow Evaluation Previous Swallow Assessment: (none reported) Diet Prior to this Study: Regular;Thin liquids Temperature Spikes Noted: No(wbc 9.5; temp 99.4) Respiratory Status: Room air History of Recent Intubation: No Behavior/Cognition: Alert;Cooperative;Pleasant mood;Confused;Distractible;Requires cueing Oral Cavity Assessment: Edema;Erythema;Dried secretions(peeling) Oral Care Completed by SLP: (attempted but too painful per pt) Oral Cavity - Dentition: Adequate natural dentition Vision: Functional for self-feeding Self-Feeding Abilities: Able to feed self;Needs assist;Needs set up(min confusion) Patient Positioning: Upright in bed(needed support) Baseline Vocal Quality: Normal;Low vocal intensity(min) Volitional Cough: Strong Volitional Swallow: Able to elicit    Oral/Motor/Sensory Function Overall Oral Motor/Sensory Function: Within functional limits(grossly but hard to fully assess d/t pain)   Ice Chips Ice chips: Impaired Pharyngeal Phase Impairments: (no overt clinical s/s of aspiration)   Thin Liquid Thin Liquid: Impaired Pharyngeal  Phase Impairments: (no overt clinical s/s of aspiration)    Nectar Thick Nectar Thick Liquid: Not tested   Honey Thick Honey Thick Liquid: Not tested   Puree Puree: Impaired Pharyngeal Phase Impairments: (no overt clinical s/s of aspiration)   Solid     Solid: Not tested       Claudia SomKatherine Watson, MS, CCC-SLP Case,Katherine 02/22/2019,3:28 PM

## 2019-02-22 NOTE — Progress Notes (Signed)
Claudia Case at [place] NAME: Claudia Case    MR#:  [id number]  DATE OF BIRTH:  May 07, 1941  SUBJECTIVE:  CHIEF COMPLAINT:   Chief Complaint  Patient presents with  . Hematuria  . Emesis   Brought with confusion and noted to have pancytopenia and some fever.   No new complaints.  Continue to have pain in mouth and not able to eat much.  Getting oral liquid. Continue to complain about pain in the mouth and not having good oral intake.  No fevers for long time.  REVIEW OF SYSTEMS:   CONSTITUTIONAL: No fever, have fatigue or weakness.  EYES: No blurred or double vision.  EARS, NOSE, AND THROAT: No tinnitus or ear pain.  RESPIRATORY: No cough, shortness of breath, wheezing or hemoptysis.  CARDIOVASCULAR: No chest pain, orthopnea, edema.  GASTROINTESTINAL: No nausea, vomiting, diarrhea or abdominal pain.  GENITOURINARY: No dysuria, hematuria.  ENDOCRINE: No polyuria, nocturia,  HEMATOLOGY: No anemia, easy bruising or bleeding SKIN: No rash or lesion. MUSCULOSKELETAL: No joint pain or arthritis.   NEUROLOGIC: No tingling, numbness, have generalized weakness.  PSYCHIATRY: No anxiety or depression.   ROS  DRUG ALLERGIES:  No Known Allergies  VITALS:  Blood pressure (!) 129/55, pulse 77, temperature 99.4 F (37.4 C), temperature source Oral, resp. rate 18, height 5\' 4"  (1.626 m), weight 59.1 kg, SpO2 98 %.  PHYSICAL EXAMINATION:  GENERAL:  78 y.o.-year-old patient lying in the bed with no acute distress.  EYES: Pupils equal, round, reactive to light and accommodation. No scleral icterus. Extraocular muscles intact.  HEENT: Head atraumatic, normocephalic. Oropharynx and nasopharynx clear.  Rashes on tongue and oral mucosa, not able to open mouth wide open. NECK:  Supple, no jugular venous distention. No thyroid enlargement, no tenderness.  LUNGS: Normal breath sounds bilaterally, no wheezing, rales,rhonchi or crepitation. No use of accessory  muscles of respiration.  CARDIOVASCULAR: S1, S2 normal. No murmurs, rubs, or gallops.  ABDOMEN: Soft, nontender, nondistended. Bowel sounds present. No organomegaly or mass.  EXTREMITIES: No pedal edema, cyanosis, or clubbing.  NEUROLOGIC: Cranial nerves II through XII are intact. Muscle strength 3/5 in all extremities. Sensation intact. Gait not checked.  Generalized PSYCHIATRIC: The patient is alert and oriented x 1-2.  SKIN: No obvious rash, lesion, or ulcer.  Ecchymosis on the skin with some breakdown and signs of recent bleed on left forearm.  Physical Exam LABORATORY PANEL:   CBC Recent Labs  Lab 02/22/19 0605  WBC 9.5  HGB 7.8*  HCT 23.2*  PLT 151   ------------------------------------------------------------------------------------------------------------------  Chemistries  Recent Labs  Lab 02/20/19 0404 02/21/19 0645 02/22/19 0605  NA 148* 146* 146*  K 3.3* 3.6 4.0  CL 118* 118* 118*  CO2 21* 22 24  GLUCOSE 158* 174* 168*  BUN 31* 25* 24*  CREATININE 0.65 0.51 0.60  CALCIUM 7.7* 7.5* 7.5*  MG 2.5*  --   --   AST 38 52*  --   ALT 27 34  --   ALKPHOS 48 55  --   BILITOT 6.5* 3.6*  --    ------------------------------------------------------------------------------------------------------------------  Cardiac Enzymes No results for input(s): TROPONINI in the last 168 hours. ------------------------------------------------------------------------------------------------------------------  RADIOLOGY:  Ct Head Wo Contrast  Result Date: 02/21/2019 CLINICAL DATA:  Initial evaluation for acute altered mental status, unclear cause. EXAM: CT HEAD WITHOUT CONTRAST TECHNIQUE: Contiguous axial images were obtained from the base of the skull through the vertex without intravenous contrast. COMPARISON:  Prior CT from  02/16/2019. FINDINGS: Brain: Generalized age-related cerebral atrophy with mild chronic small vessel ischemic disease. Small remote lacunar infarct noted  within the right frontal corona radiata. No acute intracranial hemorrhage. No acute large vessel territory infarct. No mass lesion, midline shift or mass effect. No hydrocephalus. No extra-axial fluid collection. Vascular: No hyperdense vessel. Scattered vascular calcifications noted within the carotid siphons. Skull: Scalp soft tissues and calvarium within normal limits. Sinuses/Orbits: Globes and orbital soft tissues demonstrate no acute finding. Mild scattered mucosal thickening within the ethmoidal air cells and maxillary sinuses. Paranasal sinuses are otherwise clear. No mastoid effusion. Other: None. IMPRESSION: 1. No acute intracranial abnormality. 2. Subcentimeter chronic lacunar infarct involving the right corona radiata. 3. Underlying mild chronic microvascular ischemic disease. Electronically Signed   By: Rise MuBenjamin  McClintock M.D.   On: 02/21/2019 19:01    ASSESSMENT AND PLAN:   Active Problems:   Neutropenic fever (HCC)   1.  Neutropenic fever.  Follow-up blood cultures.  Empiric cefepime.  Since the patient also has thrush ,give empiric Diflucan.  Follow temperature curve. no fever.  Bl cx shows contamination.  ID  Suggested to add Flagyl for oral anaerobic coverage for mucositis. Stopped antibiotics and antifungal as ID suggested. 2.  Pancytopenia.  Likely reaction from methotrexate.  Leucovorin IV every 12 hours.  also give Granix to elevate the white count.  Oncology consultation appreciated.   no schistocytes seen on the smear.  Continue to watch platelet count closely.  Oncology recommended rheumatology consultation,  Her platelet count dropped to 10,000 - s/p 1 U plt transfusion 02/17/19-stable now.  her counts are improving now-stop leucovorin.  Follow in oncology clinic in 2 to 3 weeks. 3.  Jaundice with elevated bilirubin.  Likely also reaction from methotrexate.  Right upper quadrant sonogram - no acute findings.  Liver functions are improving gradually. 4.  Hyponatremia.   Gentle IV fluids with normal saline-improved some.  This could be the reason for confusion.  Sodium level is actually 148 now, change IV fluid to D5W.. Sodium level is coming down now. 5.  Thrush.  IV Diflucan.  Stop now as we gave for 7 days. 6.  Confusion.  CT scan of the head done- no active findings / bleed. Improved now. She might have water-soluble vitamins deficiency, will give thiamine, folic acid, vitamin C, multivitamin tablets. Negative repeat CT head.  UA was negative. 7.  Reported diarrhea send off stool studies-  d/c contact isolation. 8.  Hyperlipidemia unspecified.  Hold statin with elevated bilirubin-may resume in the next      few days. 9.  GERD.  Hold PPI 10.  Stage I decubiti on the sacral area with just redness on the skin 11.  Severe dysphagia-due to significant mucositis she is not able to swallow food as it is very painful.  We have started on vitamin replacements as it looks like significant vitamin deficiency.  Dietitian is going to order some vitamin levels to check and then IV vitamin replacements.  I have also called ENT consult for any other opinion.  Physical therapy evaluation suggested patient is very weak and needed to go to SNF.Marland Kitchen.  All the records are reviewed and case discussed with Care Management/Social Workerr. Management plans discussed with the patient, family and they are in agreement.  CODE STATUS: Full code.  TOTAL TIME TAKING CARE OF THIS PATIENT: *35 minutes.    POSSIBLE D/C IN 2-3 DAYS, DEPENDING ON CLINICAL CONDITION.   Altamese DillingVaibhavkumar Horace Wishon M.D on 02/22/2019   Between 7am  to 6pm - Pager - [phone]  After 6pm go to [email or web address] - password [email or web address]  [facility]  Office  [phone]  CC: Primary care physician; Sula RumpleVirk, Charanjit, MD  Note: This dictation was prepared with Dragon dictation along with smaller phrase technology. Any transcriptional errors that result from this process are unintentional.

## 2019-02-22 NOTE — Consult Note (Signed)
Claudia, Case [id number] [birth year] Claudia Case, *  Reason for Consult: Evaluate because of oral ulcers  HPI: 78 year old white female who was admitted to the hospital with confusion, pancytopenia, and fever.  She has had mouth pain and not been able to eat much.  Her pancytopenia was likely a reaction to the methotrexate she has been on, along with some jaundice.  She has been having very poor oral intake and been on some medications for thrush with no benefit.  She has been confused and CT scan did not show any intracranial issues.  She has been started on some vitamins but is not taking much orally so multivitamins IV have been recommended.  ENT is consulted to evaluate her mouth and offer an opinion.  Allergies: No Known Allergies  ROS: Review of systems normal other than 12 systems except per HPI.  PMH:  Past Medical History:  Diagnosis Date  . Arthritis   . GERD (gastroesophageal reflux disease)   . Hyperlipidemia   . Hypertension   . Psoriatic arthritis ([place])     FH:  Family History  Problem Relation Age of Onset  . Breast cancer Sister 72  . CAD Mother   . Bone cancer Father     SH:  Social History   Socioeconomic History  . Marital status: Widowed    Spouse name: Not on file  . Number of children: Not on file  . Years of education: Not on file  . Highest education level: Not on file  Occupational History  . Not on file  Social Needs  . Financial resource strain: Not hard at all  . Food insecurity    Worry: Never true    Inability: Never true  . Transportation needs    Medical: No    Non-medical: No  Tobacco Use  . Smoking status: Never Smoker  . Smokeless tobacco: Never Used  Substance and Sexual Activity  . Alcohol use: Not Currently  . Drug use: Never  . Sexual activity: Not Currently  Lifestyle  . Physical activity    Days per week: 2 days    Minutes per session: 20 min  . Stress: Not at all  Relationships  . Social Clinical research associate on phone: Once a week    Gets together: Once a week    Attends religious service: 1 to 4 times per year    Active member of club or organization: No    Attends meetings of clubs or organizations: Never    Relationship status: Widowed  . Intimate partner violence    Fear of current or ex partner: No    Emotionally abused: No    Physically abused: No    Forced sexual activity: No  Other Topics Concern  . Not on file  Social History Narrative   Lives alone    PSH:  Past Surgical History:  Procedure Laterality Date  . NO PAST SURGERIES      Physical  Exam: The patient's lips have excoriation on him and are quite sore.  Inside her mouth she is edentulous and has excoriation of the gums superiorly on both sides and some inferiorly along her posterior gums bilaterally.  Most these are raw areas that have irregular reddened borders.  No lesions noted.  The posterior pharynx does not appear to be involved.  There is some residue in her mouth from some food that she tried eating earlier.   Neck supple with no masses or lesions. No lymphadenopathy palpated. Thyroid normal  with no masses.   A/P: Patient has severe stomatitis that does not appear to represent any kind of a yeast infection.  She is been treated for that with no benefit.  This could be viral in nature, especially since she has pancytopenia, and if so will run its course..  This could also represent an autoimmune problem like Behcet's disease because of its widespread areas of involvement.  Certainly vitamin deficiency with symptoms similar to scurvy can be entertained.  IV vitamin C and multivitamins may help.  Vitamin lab work showing no deficiency will rule this out.  Currently she has been written for some lidocaine orally.  I think this can help her to be used as a swish and spit, to try to cover the areas that are most sore and not swallow as it would numb her hypopharynx, which might lead to aspiration.  The numbing only  lasts for 20 minutes or so, so she should be encouraged to drink a lot of liquids right after rinsing her gums and lips.  I do not think oral or IV antibiotics are indicated for her mouth, but if there is concern for bacterial overgrowth then chlorhexidine oral rinses could be used.  I would suspect that these ulcers will take 2 to 3 weeks to settle on down.  If the oral lidocaine to numb the surface followed by taking in feedings after that do not give reasonable oral intake or calorie counts, then she probably should have an NG tube placed for a  week or 2 until she can swallow better.  This may be the only way to ensure appropriate oral intake to allow her to heal better.   Claudia Case 02/22/2019 6:33 PM

## 2019-02-22 NOTE — Progress Notes (Signed)
PT Cancellation Note  Patient Details Name: Claudia Case MRN: [id number] DOB: 1940-10-20   Cancelled Treatment:    Reason Eval/Treat Not Completed: Patient declined, no reason specified Pt confusedly refuses to work with PT.  Despite repeated attempts and multiple angles to convince her to at least do some minimal activity she spoke of being tired and needing to clean the house and that she has too much else going on and that it's "all just too much."  She showed little awareness and remains confused.  Nursing did report that she did get up and do some walking yesterday and was relatively safe and steady with guidance.  Kreg Shropshire, DPT 02/22/2019, 4:11 PM

## 2019-02-23 LAB — CULTURE, BLOOD (SINGLE): Culture: NO GROWTH

## 2019-02-23 NOTE — Progress Notes (Signed)
[facility] [place] at [facility]     PATIENT NAME: Claudia ShorterLelia Case    MR#:  [id number]  DATE OF BIRTH:  1940/12/04  SUBJECTIVE:   Patient here due to severe stomatitis/pancytopenia secondary to methotrexate toxicity.  Still quite lethargic and weak and having a difficult time eating.  Seen by ENT yesterday and no acute surgical indication.  Continue supportive care with chlorhexidine/lidocaine viscus and antibiotics for now.  REVIEW OF SYSTEMS:    Review of Systems  Constitutional: Negative for chills and fever.  HENT: Negative for congestion and tinnitus.   Eyes: Negative for blurred vision and double vision.  Respiratory: Negative for cough, shortness of breath and wheezing.   Cardiovascular: Negative for chest pain, orthopnea and PND.  Gastrointestinal: Negative for abdominal pain, diarrhea, nausea and vomiting.  Genitourinary: Negative for dysuria and hematuria.  Neurological: Positive for weakness (generalized). Negative for dizziness, sensory change and focal weakness.  All other systems reviewed and are negative.   Nutrition: Full Liquid Tolerating Diet: Yes Tolerating PT: Await Eval.   DRUG ALLERGIES:  No Known Allergies  VITALS:  Blood pressure (!) 130/59, pulse 75, temperature 98.5 F (36.9 C), resp. rate 16, height 5\' 4"  (1.626 m), weight 59.1 kg, SpO2 98 %.  PHYSICAL EXAMINATION:   Physical Exam  GENERAL:  78 y.o.-year-old thin patient lying in bed in no acute distress.  EYES: Pupils equal, round, reactive to light and accommodation. No scleral icterus. Extraocular muscles intact.  HEENT: Head atraumatic, normocephalic. Severe Mucositis and dry oral mucosa. NECK:  Supple, no jugular venous distention. No thyroid enlargement, no tenderness.  LUNGS: Poor Resp. effort, no wheezing, rales, rhonchi. No use of accessory muscles of respiration.  CARDIOVASCULAR: S1, S2 normal. No murmurs, rubs, or gallops.  ABDOMEN: Soft, nontender, nondistended.  Bowel sounds present. No organomegaly or mass.  EXTREMITIES: No cyanosis, clubbing or edema b/l.    NEUROLOGIC: Cranial nerves II through XII are intact. No focal Motor or sensory deficits b/l. Globally weak.    PSYCHIATRIC: The patient is alert and oriented x 3.  SKIN: No obvious rash, lesion, or ulcer.    LABORATORY PANEL:   CBC Recent Labs  Lab 02/22/19 0605  WBC 9.5  HGB 7.8*  HCT 23.2*  PLT 151   ------------------------------------------------------------------------------------------------------------------  Chemistries  Recent Labs  Lab 02/20/19 0404 02/21/19 0645 02/22/19 0605  NA 148* 146* 146*  K 3.3* 3.6 4.0  CL 118* 118* 118*  CO2 21* 22 24  GLUCOSE 158* 174* 168*  BUN 31* 25* 24*  CREATININE 0.65 0.51 0.60  CALCIUM 7.7* 7.5* 7.5*  MG 2.5*  --   --   AST 38 52*  --   ALT 27 34  --   ALKPHOS 48 55  --   BILITOT 6.5* 3.6*  --    ------------------------------------------------------------------------------------------------------------------  Cardiac Enzymes No results for input(s): TROPONINI in the last 168 hours. ------------------------------------------------------------------------------------------------------------------  RADIOLOGY:  Ct Head Wo Contrast  Result Date: 02/21/2019 CLINICAL DATA:  Initial evaluation for acute altered mental status, unclear cause. EXAM: CT HEAD WITHOUT CONTRAST TECHNIQUE: Contiguous axial images were obtained from the base of the skull through the vertex without intravenous contrast. COMPARISON:  Prior CT from 02/16/2019. FINDINGS: Brain: Generalized age-related cerebral atrophy with mild chronic small vessel ischemic disease. Small remote lacunar infarct noted within the right frontal corona radiata. No acute intracranial hemorrhage. No acute large vessel territory infarct. No mass lesion, midline shift or mass effect. No hydrocephalus. No extra-axial fluid collection. Vascular:  No hyperdense vessel. Scattered vascular  calcifications noted within the carotid siphons. Skull: Scalp soft tissues and calvarium within normal limits. Sinuses/Orbits: Globes and orbital soft tissues demonstrate no acute finding. Mild scattered mucosal thickening within the ethmoidal air cells and maxillary sinuses. Paranasal sinuses are otherwise clear. No mastoid effusion. Other: None. IMPRESSION: 1. No acute intracranial abnormality. 2. Subcentimeter chronic lacunar infarct involving the right corona radiata. 3. Underlying mild chronic microvascular ischemic disease. Electronically Signed   By: Jeannine Boga M.D.   On: 02/21/2019 19:01     ASSESSMENT AND PLAN:   78 year old female with past medical history of psoriatic arthritis, psoriasis, hypertension hyperlipidemia, GERD who presented to the hospital due to neutropenic fevers.  1.  Neutropenic fever-initially thought to have sepsis but that has been ruled out.  Patient was empirically placed on broad-spectrum IV antibiotics and antifungals which have all been discontinued.  Cultures remain negative.  Neutropenia and fever has resolved now.  2.  Pancytopenia-secondary to toxicity from methotrexate. - s/p Leukovorin.  Much improved now. Counts are stable.   3.  Elevated LFTs/jaundice-much improved and resolved.  Patient's bilirubins and LFTs have normalized.  This was secondary to toxicity from methotrexate.  Ultrasound showed no other acute pathology.  4.  Severe mucositis/thrush- continue nystatin, viscous lidocaine, chlorhexidine. -Seen by ENT and did not recommend any surgical intervention.  Continue supportive care with meds as mentioned above. -As per ENT this will take 2 to 3 weeks to improve and if p.o. intake does not improve patient may need Dobbhoff/NG tube feeds. - ?? Vitamin deficiency and cont. Vit. C and thiamine supplementation.   5.  Failure to thrive/poor p.o. intake-secondary to severe mucositis/thrush.  Encourage p.o. intake with Magic cup and Ensure  shakes.  If not taking enough p.o. would consider placing NG/Dobbhoff tube for feeding.  6.  Hypernatremia-improved with D5W and will continue to monitor.   All the records are reviewed and Case discussed with Care Management/Social Worker. Management plans discussed with the patient, family and they are in agreement.  CODE STATUS: Full code  DVT Prophylaxis: Ted's & SCD's.   TOTAL TIME TAKING CARE OF THIS PATIENT: 30 minutes.   POSSIBLE D/C IN 2-3 DAYS, DEPENDING ON CLINICAL CONDITION.   Henreitta Leber M.D on 02/23/2019 at 3:09 PM  Between 7am to 6pm - Pager - [phone]  After 6pm go to [email or web address] - Proofreader  [facility]  Office  [phone]  CC: Primary care physician; Sherrin Daisy, MD

## 2019-02-24 NOTE — Progress Notes (Signed)
PT Cancellation Note  Patient Details Name: Claudia Case MRN: [id number] DOB: 12/19/1940   Cancelled Treatment:    Reason Eval/Treat Not Completed: Patient declined, no reason specified. Treatment attempted; pt declined despite encouragement. Continue attempts.    Larae Grooms, PTA 02/24/2019, 2:30 PM

## 2019-02-24 NOTE — Progress Notes (Signed)
[employer] [facility] - [place] at [facility]     PATIENT NAME: Dewayne ShorterLelia Siebert    MR#:  [id number]  DATE OF BIRTH:  1941/02/23  SUBJECTIVE:   Patient here due to severe stomatitis/pancytopenia secondary to methotrexate toxicity.   Patient's p.o. intake continues to be significantly poor due to her severe stomatitis.  No other acute complaints or events overnight.  REVIEW OF SYSTEMS:    Review of Systems  Constitutional: Negative for chills and fever.  HENT: Negative for congestion and tinnitus.   Eyes: Negative for blurred vision and double vision.  Respiratory: Negative for cough, shortness of breath and wheezing.   Cardiovascular: Negative for chest pain, orthopnea and PND.  Gastrointestinal: Negative for abdominal pain, diarrhea, nausea and vomiting.  Genitourinary: Negative for dysuria and hematuria.  Neurological: Positive for weakness (generalized). Negative for dizziness, sensory change and focal weakness.  All other systems reviewed and are negative.   Nutrition: Full Liquid Tolerating Diet: Yes Tolerating PT: Await Eval.   DRUG ALLERGIES:  No Known Allergies  VITALS:  Blood pressure (!) 145/64, pulse 73, temperature 98.4 F (36.9 C), temperature source Oral, resp. rate 16, height 5\' 4"  (1.626 m), weight 59.1 kg, SpO2 98 %.  PHYSICAL EXAMINATION:   Physical Exam  GENERAL:  78 y.o.-year-old thin patient lying in bed in no acute distress.  EYES: Pupils equal, round, reactive to light and accommodation. No scleral icterus. Extraocular muscles intact.  HEENT: Head atraumatic, normocephalic. Severe Mucositis and dry oral mucosa. NECK:  Supple, no jugular venous distention. No thyroid enlargement, no tenderness.  LUNGS: Poor Resp. effort, no wheezing, rales, rhonchi. No use of accessory muscles of respiration.  CARDIOVASCULAR: S1, S2 normal. No murmurs, rubs, or gallops.  ABDOMEN: Soft, nontender, nondistended. Bowel sounds present. No organomegaly or mass.   EXTREMITIES: No cyanosis, clubbing or edema b/l.    NEUROLOGIC: Cranial nerves II through XII are intact. No focal Motor or sensory deficits b/l. Globally weak.    PSYCHIATRIC: The patient is alert and oriented x 3.  SKIN: No obvious rash, lesion, or ulcer.    LABORATORY PANEL:   CBC Recent Labs  Lab 02/22/19 0605  WBC 9.5  HGB 7.8*  HCT 23.2*  PLT 151   ------------------------------------------------------------------------------------------------------------------  Chemistries  Recent Labs  Lab 02/20/19 0404 02/21/19 0645 02/22/19 0605  NA 148* 146* 146*  K 3.3* 3.6 4.0  CL 118* 118* 118*  CO2 21* 22 24  GLUCOSE 158* 174* 168*  BUN 31* 25* 24*  CREATININE 0.65 0.51 0.60  CALCIUM 7.7* 7.5* 7.5*  MG 2.5*  --   --   AST 38 52*  --   ALT 27 34  --   ALKPHOS 48 55  --   BILITOT 6.5* 3.6*  --    ------------------------------------------------------------------------------------------------------------------  Cardiac Enzymes No results for input(s): TROPONINI in the last 168 hours. ------------------------------------------------------------------------------------------------------------------  RADIOLOGY:  No results found.   ASSESSMENT AND PLAN:   78 year old female with past medical history of psoriatic arthritis, psoriasis, hypertension hyperlipidemia, GERD who presented to the hospital due to neutropenic fevers.  1.  Neutropenic fever-initially thought to have sepsis but that has been ruled out.  Patient was empirically placed on broad-spectrum IV antibiotics and antifungals which have all been discontinued.  Cultures remain negative.  Neutropenia and fever has resolved now.  2.  Pancytopenia-secondary to toxicity from methotrexate. - s/p Leukovorin.  Much improved now. Counts are stable.   3.  Elevated LFTs/jaundice-much improved and resolved.  Patient's bilirubins and  LFTs have normalized.  This was secondary to toxicity from methotrexate.  Ultrasound  showed no other acute pathology.  4.  Severe mucositis/thrush- continue nystatin, viscous lidocaine, chlorhexidine. -Seen by ENT and they did not recommend any surgical intervention.  Continue supportive care with meds as mentioned above. -As per ENT this will take 2 to 3 weeks to improve and if p.o. intake does not improve patient may need Dobbhoff/NG tube feeds. - ?? Vitamin deficiency and cont. Vit. C and thiamine supplementation.   5.  Failure to thrive/poor p.o. intake-secondary to severe mucositis/thrush.   - PO intake continues to be quite poor and therefore will have Radiology put in a dobhoff tube tomorrow for tube feeds.    6.  Hypernatremia- cont. d5W and will cont. To monitor.    All the records are reviewed and case discussed with Care Management/Social Worker. Management plans discussed with the patient, family and they are in agreement.  CODE STATUS: Full code  DVT Prophylaxis: Ted's & SCD's.   TOTAL TIME TAKING CARE OF THIS PATIENT: 30 minutes.   POSSIBLE D/C IN 3-4 DAYS, DEPENDING ON CLINICAL CONDITION.   Henreitta Leber M.D on 02/24/2019 at 1:13 PM  Between 7am to 6pm - Pager - [phone]  After 6pm go to [email or web address] - Proofreader  [facility] [place] [facility]  Office  [phone]  CC: Primary care physician; Sherrin Daisy, MD

## 2019-02-25 ENCOUNTER — Inpatient Hospital Stay: Payer: Medicare HMO

## 2019-02-25 LAB — VITAMIN C: Vitamin C: <0.1 mg/dL — ABNORMAL LOW (ref 0.4–2.0)

## 2019-02-25 LAB — CULTURE, BLOOD (ROUTINE X 2): Culture: NO GROWTH

## 2019-02-25 LAB — BASIC METABOLIC PANEL
Anion gap: 5 (ref 5–15)
BUN: 26 mg/dL — ABNORMAL HIGH (ref 8–23)
CO2: 25 mmol/L (ref 22–32)
Calcium: 7.5 mg/dL — ABNORMAL LOW (ref 8.9–10.3)
Chloride: 105 mmol/L (ref 98–111)
Creatinine, Ser: 0.84 mg/dL (ref 0.44–1.00)
GFR calc Af Amer: >60 mL/min (ref >60)
GFR calc non Af Amer: >60 mL/min (ref >60)
Glucose, Bld: 136 mg/dL — ABNORMAL HIGH (ref 70–99)
Potassium: 3.6 mmol/L (ref 3.5–5.1)
Sodium: 135 mmol/L (ref 135–145)

## 2019-02-25 LAB — MISC LABCORP TEST (SEND OUT): Labcorp test code: 1805

## 2019-02-25 LAB — GLUCOSE, CAPILLARY
Glucose-Capillary: 135 mg/dL — ABNORMAL HIGH (ref 70–99)
Glucose-Capillary: 137 mg/dL — ABNORMAL HIGH (ref 70–99)

## 2019-02-25 MED ORDER — VITAL HIGH PROTEIN PO LIQD: 1000.0000 mL | ORAL | Status: DC

## 2019-02-25 MED ORDER — OSMOLITE 1.5 CAL PO LIQD
1000.0000 mL | ORAL | Status: DC
  Administered 2019-02-25 – 2019-02-27 (×2): 1000 mL

## 2019-02-25 MED ORDER — FREE WATER
30.0000 mL | Status: DC
  Administered 2019-02-25 – 2019-03-11 (×61): 30 mL

## 2019-02-25 MED ORDER — IOHEXOL 300 MG/ML  SOLN
10.0000 mL | Freq: Once | INTRAMUSCULAR | Status: AC | PRN
  Administered 2019-02-25: 09:00:00 10 mL via ORAL

## 2019-02-25 NOTE — Care Management Important Message (Signed)
Important Message  Patient Details  Name: Claudia Case MRN: [id number] Date of Birth: 02/10/1941   Medicare Important Message Given:  Yes     Dannette Barbara 02/25/2019, 11:13 AM

## 2019-02-25 NOTE — Progress Notes (Signed)
[employer] - [place] at [facility]     PATIENT NAME: Claudia ShorterLelia Case    MR#:  [id number]  DATE OF BIRTH:  01-11-1941  SUBJECTIVE:   Patient here due to severe stomatitis/pancytopenia secondary to methotrexate toxicity.   Patient is status post IR guided Dobbhoff tube placement today.  Still complaining of generalized weakness and pain in her mouth but no other acute events or complaints presently.  REVIEW OF SYSTEMS:    Review of Systems  Constitutional: Negative for chills and fever.  HENT: Negative for congestion and tinnitus.   Eyes: Negative for blurred vision and double vision.  Respiratory: Negative for cough, shortness of breath and wheezing.   Cardiovascular: Negative for chest pain, orthopnea and PND.  Gastrointestinal: Negative for abdominal pain, diarrhea, nausea and vomiting.  Genitourinary: Negative for dysuria and hematuria.  Neurological: Positive for weakness (generalized). Negative for dizziness, sensory change and focal weakness.  All other systems reviewed and are negative.   Nutrition: Full Liquid Tolerating Diet: Yes Tolerating PT: Await Eval.   DRUG ALLERGIES:  No Known Allergies  VITALS:  Blood pressure 129/72, pulse 73, temperature 98.2 F (36.8 C), temperature source Oral, resp. rate 16, height 5\' 4"  (1.626 m), weight 59.1 kg, SpO2 96 %.  PHYSICAL EXAMINATION:   Physical Exam  GENERAL:  78 y.o.-year-old thin patient lying in bed in no acute distress.  EYES: Pupils equal, round, reactive to light and accommodation. No scleral icterus. Extraocular muscles intact.  HEENT: Head atraumatic, normocephalic. Severe Mucositis and dry oral mucosa. dobhoff tube in place.   NECK:  Supple, no jugular venous distention. No thyroid enlargement, no tenderness.  LUNGS: Poor Resp. effort, no wheezing, rales, rhonchi. No use of accessory muscles of respiration.  CARDIOVASCULAR: S1, S2 normal. No murmurs, rubs, or gallops.  ABDOMEN: Soft, nontender,  nondistended. Bowel sounds present. No organomegaly or mass.  EXTREMITIES: No cyanosis, clubbing or edema b/l.    NEUROLOGIC: Cranial nerves II through XII are intact. No focal Motor or sensory deficits b/l. Globally weak.    PSYCHIATRIC: The patient is alert and oriented x 3.  SKIN: No obvious rash, lesion, or ulcer.    LABORATORY PANEL:   CBC Recent Labs  Lab 02/22/19 0605  WBC 9.5  HGB 7.8*  HCT 23.2*  PLT 151   ------------------------------------------------------------------------------------------------------------------  Chemistries  Recent Labs  Lab 02/20/19 0404 02/21/19 0645  02/25/19 0300  NA 148* 146*   < > 135  K 3.3* 3.6   < > 3.6  CL 118* 118*   < > 105  CO2 21* 22   < > 25  GLUCOSE 158* 174*   < > 136*  BUN 31* 25*   < > 26*  CREATININE 0.65 0.51   < > 0.84  CALCIUM 7.7* 7.5*   < > 7.5*  MG 2.5*  --   --   --   AST 38 52*  --   --   ALT 27 34  --   --   ALKPHOS 48 55  --   --   BILITOT 6.5* 3.6*  --   --    < > = values in this interval not displayed.   ------------------------------------------------------------------------------------------------------------------  Cardiac Enzymes No results for input(s): TROPONINI in the last 168 hours. ------------------------------------------------------------------------------------------------------------------  RADIOLOGY:  Dg Naso G Tube Plc W/fl W/rad  Result Date: 02/25/2019 CLINICAL DATA:  Dobbhoff tube placement EXAM: NASO G TUBE PLACEMENT WITH FL AND WITH RAD CONTRAST:  20 mL Isovue-300 FLUOROSCOPY  TIME:  Fluoroscopy Time:  42 seconds Radiation Exposure Index (if provided by the fluoroscopic device): 12.1 mGy Number of Acquired Spot Images: 0 COMPARISON:  None. FINDINGS: Real-time fluoroscopy was utilized for placement of a Dobbhoff tube. Dobbhoff tube was inserted through the left nare without difficulty. Large hiatal hernia. Dobbhoff tube coiled within the hiatal hernia. Tip of the Dobbhoff tube could  not be advanced into the small bowel after multiple attempts at manipulation of the tube. The tube was left in place with the tip within hiatal hernia portion of the stomach. IMPRESSION: 1. Dobbhoff tube placement with the tip in the stomach within a large hiatal hernia. Electronically Signed   By: Kathreen Devoid   On: 02/25/2019 09:26     ASSESSMENT AND PLAN:   78 year old female with past medical history of psoriatic arthritis, psoriasis, hypertension hyperlipidemia, GERD who presented to the hospital due to neutropenic fevers.  1.  Neutropenic fever-initially thought to have sepsis but that has been ruled out.  Patient was empirically placed on broad-spectrum IV antibiotics and antifungals which have all been discontinued.  Cultures remain negative.  Neutropenia and fever has resolved now.  2.  Pancytopenia-secondary to toxicity from methotrexate. - s/p Leukovorin.  Improving and counts are stable.    3.  Elevated LFTs/jaundice-much improved and resolved.  Patient's bilirubins and LFTs have normalized.  This was secondary to toxicity from methotrexate.  Ultrasound showed no other acute pathology.  4.  Severe mucositis/thrush- continue nystatin, viscous lidocaine, chlorhexidine. -Seen by ENT and they did not recommend any surgical intervention.  Continue supportive care with meds as mentioned above. -Status post Dobbhoff tube placement will start tube feedings today. - cont. Vitamin supplements with C and thiamine.  Slow to improve and may take 2-3 weeks as per ENT.   5.  Failure to thrive/poor p.o. intake-secondary to severe mucositis/thrush.  - PO intake continues to be poor and it is post IR guided Dobbhoff tube placement today.  Started on tube feeds via Dobbhoff today.  We will continue to monitor.  6.  Hypernatremia- improved with D5W and will DC fluids for now.  Follow electrolytes.  disCussed plan of care with patient's sister over the phone.   All the records are reviewed and  Case discussed with Care Management/Social Worker. Management plans discussed with the patient, family and they are in agreement.  CODE STATUS: Full code  DVT Prophylaxis: Ted's & SCD's.   TOTAL TIME TAKING CARE OF THIS PATIENT: 30 minutes.   POSSIBLE D/C IN 3-4 DAYS, DEPENDING ON CLINICAL CONDITION.   Henreitta Leber M.D on 02/25/2019 at 2:07 PM  Between 7am to 6pm - Pager - [phone]  After 6pm go to [email or web address] - Proofreader  [facility] [place] [facility]  Office  [phone]  CC: Primary care physician; Sherrin Daisy, MD

## 2019-02-25 NOTE — Progress Notes (Signed)
Nutrition Follow-up  RD working remotely.  DOCUMENTATION CODES:   Not applicable  INTERVENTION:  Initiate Osmolite 1.5 Cal at 15 mL/hr.  If patient is tolerating trickles tomorrow, consider advancing by 15 mL/hr every 12 hours to goal rate of 45 mL/hr. Provides 1620 kcal, 73 grams of protein, 821 mL H2O daily.  Provide minimum free water flush of 30 mL Q4hrs.  Will discontinue Ensure Enlive and Pro-Stat for now.  Continue MVI daily, B-complex with C daily, vitamin C 250 mg BID. Will continue to monitor for niacin and B6 lab results.  Patient remains at risk for refeeding syndrome. Continue monitoring potassium, magnesium, and phosphorus and replacing as needed.  NUTRITION DIAGNOSIS:   Inadequate oral intake related to acute illness(thrush, nausea, vomiting) as evidenced by meal completion < 25%.  Ongoing - addressing with TF regimen.  GOAL:   Patient will meet greater than or equal to 90% of their needs  Progressing with initiation of TF regimen.  MONITOR:   PO intake, Supplement acceptance, Labs, Weight trends, Skin, I & O's  REASON FOR ASSESSMENT:   Consult Assessment of nutrition requirement/status  ASSESSMENT:   78 y.o. female with a history of psoriatic arthritis on methotrexate presented to the emergency room on 02/16/2019 with 1 week history of sore throat, difficulty swallowing, blood in urine and rash.  Patient's PO intake remains poor in setting of stomatitis. She is eating 0% of meals. Discussed with MD. Plan is to proceed with trickle feeds today. If patient tolerates will consider slow advancement to goal. Also discussed with RN in the afternoon. Plan is to discontinue Ensure and Pro-Stat for now so we can see if patient will tolerate tube feeds.  Enteral Access: Dobbhoff tube placed by IR 6/29; tip terminates within hiatal hernia portion of stomach  Medications reviewed and include: B-complex with C daily, folic acid 1 mg daily, MVI daily, nystatin,  Flomax, thiamine 100 mg daily, vitamin C 250 mg BID.  Labs reviewed: BUN 26, Vitamin C <0.1 (patient has ascorbic acid deficiency - scurvy). Still pending niacin and B6 labs.  Diet Order:   Diet Order            Diet full liquid Room service appropriate? Yes with Assist; Fluid consistency: Thin  Diet effective now             EDUCATION NEEDS:   Not appropriate for education at this time  Skin:  Skin Assessment: Reviewed RN Assessment(ecchymosis, rash, Stage I sacrum)  Last BM:  Unknown  Height:   Ht Readings from Last 1 Encounters:  02/16/19 5\' 4"  (1.626 m)   Weight:   Wt Readings from Last 1 Encounters:  02/16/19 59.1 kg   Ideal Body Weight:  54.5 kg  BMI:  Body mass index is 22.35 kg/m.  Estimated Nutritional Needs:   Kcal:  1400-1600kcal/day  Protein:  70-80g/day  Fluid:  >1.4L/day  Willey Blade, MS, RD, LDN Office: [phone] Pager: [phone] After Hours/Weekend Pager: [phone]

## 2019-02-25 NOTE — Progress Notes (Signed)
OT Cancellation Note  Patient Details Name: Tamar Miano MRN: [id number] DOB: 08/07/1941   Cancelled Treatment:    Reason Eval/Treat Not Completed: Patient at procedure or test/ unavailable(Nursing care being perfromed with pt. Will continue to monitor, and attempt at a later time, or date.)  Harrel Carina, MS, OTR/L 02/25/2019, 1:46 PM

## 2019-02-26 ENCOUNTER — Inpatient Hospital Stay: Payer: Medicare HMO

## 2019-02-26 LAB — CBC
HCT: 27.9 % — ABNORMAL LOW (ref 36.0–46.0)
Hemoglobin: 9 g/dL — ABNORMAL LOW (ref 12.0–15.0)
MCH: 34.4 pg — ABNORMAL HIGH (ref 26.0–34.0)
MCHC: 32.3 g/dL (ref 30.0–36.0)
MCV: 106.5 fL — ABNORMAL HIGH (ref 80.0–100.0)
Platelets: 297 10*3/uL (ref 150–400)
RBC: 2.62 MIL/uL — ABNORMAL LOW (ref 3.87–5.11)
RDW: 18.6 % — ABNORMAL HIGH (ref 11.5–15.5)
WBC: 8.7 10*3/uL (ref 4.0–10.5)
nRBC: 0 % (ref 0.0–0.2)

## 2019-02-26 LAB — BASIC METABOLIC PANEL
Anion gap: 5 (ref 5–15)
BUN: 35 mg/dL — ABNORMAL HIGH (ref 8–23)
CO2: 24 mmol/L (ref 22–32)
Calcium: 7.2 mg/dL — ABNORMAL LOW (ref 8.9–10.3)
Chloride: 107 mmol/L (ref 98–111)
Creatinine, Ser: 1.03 mg/dL — ABNORMAL HIGH (ref 0.44–1.00)
GFR calc Af Amer: >60 mL/min (ref >60)
GFR calc non Af Amer: 52 mL/min — ABNORMAL LOW (ref >60)
Glucose, Bld: 109 mg/dL — ABNORMAL HIGH (ref 70–99)
Potassium: 4 mmol/L (ref 3.5–5.1)
Sodium: 136 mmol/L (ref 135–145)

## 2019-02-26 LAB — GLUCOSE, CAPILLARY
Glucose-Capillary: 103 mg/dL — ABNORMAL HIGH (ref 70–99)
Glucose-Capillary: 108 mg/dL — ABNORMAL HIGH (ref 70–99)
Glucose-Capillary: 109 mg/dL — ABNORMAL HIGH (ref 70–99)
Glucose-Capillary: 79 mg/dL (ref 70–99)
Glucose-Capillary: 96 mg/dL (ref 70–99)

## 2019-02-26 MED ORDER — IOHEXOL 300 MG/ML  SOLN
30.0000 mL | Freq: Once | INTRAMUSCULAR | Status: AC | PRN
  Administered 2019-02-26: 12:00:00 30 mL

## 2019-02-26 MED ORDER — SODIUM CHLORIDE 0.9 % IV SOLN
INTRAVENOUS | Status: AC
  Administered 2019-02-26 (×2): via INTRAVENOUS

## 2019-02-26 MED ORDER — SODIUM CHLORIDE 0.9 % IV BOLUS
1000.0000 mL | Freq: Once | INTRAVENOUS | Status: AC
  Administered 2019-02-26: 04:00:00 1000 mL via INTRAVENOUS

## 2019-02-26 NOTE — Progress Notes (Signed)
MD notified of pt inability to void and bladder scan results. Orders for 1L NS bolus x 1 and begin maintenance fluids. Will continue to monitor.

## 2019-02-26 NOTE — Progress Notes (Signed)
[employer] - [place] at [facility]     PATIENT NAME: Claudia Case    MR#:  [id number]  DATE OF BIRTH:  [birth year]  SUBJECTIVE:   Patient here due to severe stomatitis/pancytopenia secondary to methotrexate toxicity.   Pt. dobhoff tube tube had to be repositioned this burning as an x-ray overnight showed it was coiled into a hiatal hernia and patient was not tolerating her tube feeds.  REVIEW OF SYSTEMS:    Review of Systems  Constitutional: Negative for chills and fever.  HENT: Negative for congestion and tinnitus.   Eyes: Negative for blurred vision and double vision.  Respiratory: Negative for cough, shortness of breath and wheezing.   Cardiovascular: Negative for chest pain, orthopnea and PND.  Gastrointestinal: Negative for abdominal pain, diarrhea, nausea and vomiting.  Genitourinary: Negative for dysuria and hematuria.  Neurological: Positive for weakness (generalized). Negative for dizziness, sensory change and focal weakness.  All other systems reviewed and are negative.   Nutrition: Full Liquid Tolerating Diet: but very little.  Tolerating PT: Await Eval.   DRUG ALLERGIES:  No Known Allergies  VITALS:  Blood pressure (!) 144/117, pulse 81, temperature 98.6 F (37 C), temperature source Oral, resp. rate 18, height 5\' 4"  (1.626 m), weight 59.1 kg, SpO2 99 %.  PHYSICAL EXAMINATION:   Physical Exam  GENERAL:  78 y.o.-year-old thin patient lying in bed in no acute distress.  EYES: Pupils equal, round, reactive to light and accommodation. No scleral icterus. Extraocular muscles intact.  HEENT: Head atraumatic, normocephalic. Severe Mucositis and dry oral mucosa. dobhoff tube in place.   NECK:  Supple, no jugular venous distention. No thyroid enlargement, no tenderness.  LUNGS: Poor Resp. effort, no wheezing, rales, rhonchi. No use of accessory muscles of respiration.  CARDIOVASCULAR: S1, S2 normal. No murmurs, rubs, or gallops.  ABDOMEN: Soft,  nontender, nondistended. Bowel sounds present. No organomegaly or mass.  EXTREMITIES: No cyanosis, clubbing or edema b/l.    NEUROLOGIC: Cranial nerves II through XII are intact. No focal Motor or sensory deficits b/l. Globally weak.    PSYCHIATRIC: The patient is alert and oriented x 3.  SKIN: No obvious rash, lesion, or ulcer.    LABORATORY PANEL:   CBC Recent Labs  Lab 02/26/19 0529  WBC 8.7  HGB 9.0*  HCT 27.9*  PLT 297   ------------------------------------------------------------------------------------------------------------------  Chemistries  Recent Labs  Lab 02/20/19 0404 02/21/19 0645  02/26/19 0529  NA 148* 146*   < > 136  K 3.3* 3.6   < > 4.0  CL 118* 118*   < > 107  CO2 21* 22   < > 24  GLUCOSE 158* 174*   < > 109*  BUN 31* 25*   < > 35*  CREATININE 0.65 0.51   < > 1.03*  CALCIUM 7.7* 7.5*   < > 7.2*  MG 2.5*  --   --   --   AST 38 52*  --   --   ALT 27 34  --   --   ALKPHOS 48 55  --   --   BILITOT 6.5* 3.6*  --   --    < > = values in this interval not displayed.   ------------------------------------------------------------------------------------------------------------------  Cardiac Enzymes No results for input(s): TROPONINI in the last 168 hours. ------------------------------------------------------------------------------------------------------------------  RADIOLOGY:  Dg Abd 1 View  Result Date: 02/26/2019 CLINICAL DATA:  Feeding tube placement EXAM: ABDOMEN - 1 VIEW COMPARISON:  None. FINDINGS: Feeding tube coils in the  lower chest, likely within the large hiatal hernia. Nonobstructive bowel gas pattern. Heart is borderline in size. Bibasilar atelectasis or infiltrates. No visible effusions. IMPRESSION: Feeding tube coils in the lower chest is within the large hiatal hernia. Bibasilar atelectasis or infiltrates. Electronically Signed   By: Rolm Baptise M.D.   On: 02/26/2019 00:40   Dg Addison Bailey G Tube Plc W/fl W/rad  Result Date: 02/26/2019  CLINICAL DATA:  Dobbhoff tube placed yesterday which was coiled in the hiatal hernia. Patient presents for repositioning and attempted advancement into the proximal small bowel. EXAM: NASO G TUBE PLACEMENT WITH FL AND WITH RAD CONTRAST:  30 mL Omnipaque 300 FLUOROSCOPY TIME:  Fluoroscopy Time:  10.7 minute Radiation Exposure Index (if provided by the fluoroscopic device): 100.3 mGy Number of Acquired Spot Images: 0 COMPARISON:  None. FINDINGS: Real-time fluoroscopy was utilized for repositioning of a Dobbhoff tube. Dobbhoff tube was within a large hiatal hernia. Multiple attempts were made at repositioning the Dobbhoff tube prior to and following reinsert shin of the stylet. 30 mL Omnipaque 300 was hand injected through the Dobbhoff tube for opacification of the stomach for better delineation of the anatomy. There is organoaxial volvulus of the stomach without gastric outlet obstruction. The Dobbhoff tube continually becomes coiled within the fundus of the stomach and could not be advanced into the body or antrum of the stomach. Tip of the Dobbhoff tube could not be advanced into the small bowel after multiple attempts at manipulation of the tube. The tube was left in place with the tip within hiatal hernia portion of the stomach. IMPRESSION: Dobbhoff tube with the tip in the fundus of the stomach. The tube could not be advanced into the post pyloric position. Large hiatal hernia with the entirety of the stomach within the thorax. 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 Electronically Signed   By: Kathreen Devoid   On: 02/26/2019 11:44   Dg Loyce Dys Tube Plc W/fl W/rad  Result Date: 02/25/2019 CLINICAL DATA:  Dobbhoff tube placement EXAM: NASO G TUBE PLACEMENT WITH FL AND WITH RAD CONTRAST:  20 mL Isovue-300 FLUOROSCOPY TIME:  Fluoroscopy Time:  42 seconds Radiation Exposure Index (if provided by the fluoroscopic device): 12.1 mGy Number of Acquired Spot Images: 0 COMPARISON:  None.  FINDINGS: Real-time fluoroscopy was utilized for placement of a Dobbhoff tube. Dobbhoff tube was inserted through the left nare without difficulty. Large hiatal hernia. Dobbhoff tube coiled within the hiatal hernia. Tip of the Dobbhoff tube could not be advanced into the small bowel after multiple attempts at manipulation of the tube. The tube was left in place with the tip within hiatal hernia portion of the stomach. IMPRESSION: 1. Dobbhoff tube placement with the tip in the stomach within a large hiatal hernia. Electronically Signed   By: Kathreen Devoid   On: 02/25/2019 09:26     ASSESSMENT AND PLAN:   78 year old female with past medical history of psoriatic arthritis, psoriasis, hypertension hyperlipidemia, GERD who presented to the hospital due to neutropenic fevers.  1.  Neutropenic fever-initially thought to have sepsis but that has been ruled out.  Patient was empirically placed on broad-spectrum IV antibiotics and antifungals which have all been discontinued.  Cultures remain negative.  Neutropenia and fever has resolved now.  2.  Pancytopenia-secondary to toxicity from methotrexate. - s/p Leukovorin.  Resolved as counts have improved.   3.  Elevated LFTs/jaundice-much improved and resolved.  Patient's bilirubins and LFTs have normalized.  This was secondary to  toxicity from methotrexate.  Ultrasound showed no other acute pathology.  4.  Severe mucositis/thrush- continue nystatin, viscous lidocaine, chlorhexidine. -Seen by ENT and they did not recommend any surgical intervention.  Continue supportive care with meds as mentioned above. -Status post Dobbhoff tube placement yesterday and started on tube feeds but patient had some nausea and vomiting overnight and therefore x-ray overnight showing that the Dobbhoff tube was coiled in the hiatal hernia.  Patient had her Dobbhoff tube repositioned today.  Will resume tube feedings. - cont. Vitamin supplements with C and thiamine.  Slow to improve  and may take 2-3 weeks as per ENT.   5.  Failure to thrive/poor p.o. intake-secondary to severe mucositis/thrush.  - Dobbhoff tube repositioned today and will resume tube feedings as tolerated. - We will continue to monitor.  6.  Hypernatremia- improved with D5W. Follow electrolytes.  disCussed plan of care with patient's sister over the phone.   All the records are reviewed and case discussed with Care Management/Social Worker. Management plans discussed with the patient, family and they are in agreement.  CODE STATUS: Full code  DVT Prophylaxis: Ted's & SCD's.   TOTAL TIME TAKING CARE OF THIS PATIENT: 30 minutes.   POSSIBLE D/C IN 3-4 DAYS, DEPENDING ON CLINICAL CONDITION.   Houston SirenVivek J Tatiyanna Lashley M.D on 02/26/2019 at 2:48 PM  Between 7am to 6pm - Pager - [phone]  After 6pm go to [email or web address] - Social research officer, governmentpassword EPAS [email or web address]  [facility] [place] [facility]  Office  [phone]  CC: Primary care physician; Sula RumpleVirk, Charanjit, MD

## 2019-02-26 NOTE — Progress Notes (Signed)
Speech Therapy note: pt continues w/ excoriation of the lips/gums w/ little to no oral intake d/t discomfort. MD ordered for Dobhoff placement, and pt is now followed by Dietician for goal rate of TFs. Noted pt has a hiatal hernia per chart notes. ENT is also following pt for tx.  No further skilled ST services indicated at this time. MD/NSG to reconsult ST services once pt returns to an oral diet and if any oropharyngeal phase deficits are noted w/ her swallowing status. Recommend ongoing oral care; general aspiration precautions. NSG agreed.    Orinda Kenner, [place], CCC-SLP

## 2019-02-26 NOTE — Progress Notes (Signed)
Physical Therapy Treatment Patient Details Name: Claudia Case MRN: [id number] DOB: 1941/07/02 Today's Date: 02/26/2019    History of Present Illness Patient is a 78 year old female that presents with weakness, mouth sores, pain in her hands, and nausea/vomiting. She was found to have pancytopenia, elevated bilirubin levels. She has a history of RA, which has been managed with methotrexate.    PT Comments    Pt is making slow progress towards goals. Needs encouragement to participate this session. Appears confused. There-ex performed. Able to ambulate to recliner and sit up for lunch tray (liquids). Of not pt with feeding tube, currently not connected. Reports she wants to call her sister, however unable to work the phone. Therapist dialed number and left as the patient carrying on conversation.  Continue to recommend SNF placement. Will continue to progress.  Follow Up Recommendations  SNF;Supervision for mobility/OOB     Equipment Recommendations  Rolling walker with 5" wheels    Recommendations for Other Services       Precautions / Restrictions Precautions Precautions: Fall Restrictions Weight Bearing Restrictions: No    Mobility  Bed Mobility Overal bed mobility: Needs Assistance Bed Mobility: Supine to Sit     Supine to sit: Mod assist     General bed mobility comments: heavy assist required for trunk and sliding B LEs off bed. Able to initiate movement. Once seated, able to sit with cga  Transfers Overall transfer level: Needs assistance Equipment used: Rolling walker (2 wheeled) Transfers: Sit to/from Stand Sit to Stand: Min assist         General transfer comment: cues to push from seated surface. RW used. Required for bed elevated prior to transfer  Ambulation/Gait Ambulation/Gait assistance: Min assist Gait Distance (Feet): 5 Feet Assistive device: Rolling walker (2 wheeled) Gait Pattern/deviations: Step-to pattern Gait velocity: decreased    General Gait Details: fatigues quickly. Able to ambulate to recliner. Takes increased coaxing to participate. Short steps with flexed trunk.   Stairs             Wheelchair Mobility    Modified Rankin (Stroke Patients Only)       Balance Overall balance assessment: Needs assistance Sitting-balance support: Feet supported Sitting balance-Leahy Scale: Fair     Standing balance support: Bilateral upper extremity supported Standing balance-Leahy Scale: Fair                              Cognition Arousal/Alertness: Awake/alert Behavior During Therapy: WFL for tasks assessed/performed Overall Cognitive Status: Impaired/Different from baseline                                 General Comments: confused      Exercises Other Exercises Other Exercises: supine ther-ex performed including B LE AP, SLRs, and hip abd/add. 10 reps with min assist    General Comments        Pertinent Vitals/Pain Pain Assessment: No/denies pain    Home Living                      Prior Function            PT Goals (current goals can now be found in the care plan section) Acute Rehab PT Goals Patient Stated Goal: To return home PT Goal Formulation: With patient Time For Goal Achievement: 03/04/19 Potential to Achieve Goals: Good Progress  towards PT goals: Progressing toward goals    Frequency    Min 2X/week      PT Plan Current plan remains appropriate    Co-evaluation              AM-PAC PT "6 Clicks" Mobility   Outcome Measure  Help needed turning from your back to your side while in a flat bed without using bedrails?: A Lot Help needed moving from lying on your back to sitting on the side of a flat bed without using bedrails?: A Lot Help needed moving to and from a bed to a chair (including a wheelchair)?: A Little Help needed standing up from a chair using your arms (e.g., wheelchair or bedside chair)?: A Little Help needed  to walk in hospital room?: A Little Help needed climbing 3-5 steps with a railing? : A Lot 6 Click Score: 15    End of Session Equipment Utilized During Treatment: Gait belt Activity Tolerance: Patient tolerated treatment well Patient left: in chair;with call bell/phone within reach;with chair alarm set Nurse Communication: Mobility status PT Visit Diagnosis: Unsteadiness on feet (R26.81);Difficulty in walking, not elsewhere classified (R26.2)     Time: 1610-9604 PT Time Calculation (min) (ACUTE ONLY): 23 min  Charges:  $Gait Training: 8-22 mins $Therapeutic Exercise: 8-22 mins                     Claudia Case, PT, DPT [phone]    Claudia Case 02/26/2019, 2:22 PM

## 2019-02-26 NOTE — Progress Notes (Signed)
Occupational Therapy Treatment Patient Details Name: Claudia Case MRN: [id number] DOB: [birth year] Today's Date: 02/26/2019    History of present illness Patient is a 78 year old female that presents with weakness, mouth sores, pain in her hands, and nausea/vomiting. She was found to have pancytopenia, elevated bilirubin levels. She has a history of RA, which has been managed with methotrexate.   OT comments  Pt seen for OT tx this date. Pt pleasant and agreeable to participation. Pt noted with peeling/flaky skin on BUE, neck, and lips. With set up of wet cloth, pt able to wash her face, requiring Min A for thoroughness around her lips. Pt requesting to call sister. Initially attempts to use call bell as phone when handed to her immediately after instruction in use of call bell to call nursing for assistance. Pt ultimately unable to work her room phone, requiring therapist to dial number to her sister. Pt continues to benefit from skilled OT services. Continue to recommend STR.    Follow Up Recommendations  SNF    Equipment Recommendations       Recommendations for Other Services      Precautions / Restrictions Precautions Precautions: Fall Restrictions Weight Bearing Restrictions: No       Mobility Bed Mobility     General bed mobility comments: deferred, up in recliner  Transfers Overall transfer level: Needs assistance Equipment used: Rolling walker (2 wheeled) Transfers: Sit to/from Stand Sit to Stand: Min assist         General transfer comment: cues to push from seated surface. RW used. Required for bed elevated prior to transfer    Balance Overall balance assessment: Needs assistance Sitting-balance support: Feet supported Sitting balance-Leahy Scale: Fair     Standing balance support: Bilateral upper extremity supported Standing balance-Leahy Scale: Fair                             ADL either performed or assessed with clinical judgement    ADL Overall ADL's : Needs assistance/impaired     Grooming: Minimal assistance;Sitting;Cueing for sequencing;Wash/dry face                                       Vision Patient Visual Report: No change from baseline     Perception     Praxis      Cognition Arousal/Alertness: Awake/alert Behavior During Therapy: WFL for tasks assessed/performed Overall Cognitive Status: Impaired/Different from baseline                                 General Comments: confused, follows commands with cues, difficulty attempting to use call bell as a phone to call her sister        Exercises Exercises: Other exercises Other Exercises Other Exercises: with instruction for sequencing, pt had difficulty dialing phone number to call sister requiring therapist to do it for her Other Exercises: with set up and cues, ultimately requiring Min A for thoroughness, pt washed face   Shoulder Instructions       General Comments significant bruising on R forearm, other bruising and peeling/flaky skin on various parts of arms, neck, and lips    Pertinent Vitals/ Pain       Pain Assessment: No/denies pain  Home Living  Prior Functioning/Environment              Frequency  Min 2X/week        Progress Toward Goals  OT Goals(current goals can now be found in the care plan section)  Progress towards OT goals: Progressing toward goals  Acute Rehab OT Goals Patient Stated Goal: To return home OT Goal Formulation: With patient Potential to Achieve Goals: Good  Plan Discharge plan remains appropriate;Frequency remains appropriate    Co-evaluation                 AM-PAC OT "6 Clicks" Daily Activity     Outcome Measure   Help from another person eating meals?: A Little Help from another person taking care of personal grooming?: A Little Help from another person toileting, which includes using  toliet, bedpan, or urinal?: A Lot Help from another person bathing (including washing, rinsing, drying)?: A Lot Help from another person to put on and taking off regular upper body clothing?: A Lot Help from another person to put on and taking off regular lower body clothing?: A Lot 6 Click Score: 14    End of Session    OT Visit Diagnosis: Muscle weakness (generalized) (M62.81)   Activity Tolerance Patient tolerated treatment well   Patient Left in chair;with call bell/phone within reach;with chair alarm set   Nurse Communication          Time: 1350-1403 OT Time Calculation (min): 13 min  Charges: OT General Charges $OT Visit: 1 Visit OT Treatments $Self Care/Home Management : 8-22 mins  Jeni Salles, MPH, MS, OTR/L ascom [phone] 02/26/19, 2:38 PM

## 2019-02-26 NOTE — Progress Notes (Signed)
Pt noted to have reflux and frequent swallowing during NG flushes (medication administration). Also, cream colored fluid in the pt's mouth (color resembling tube feeds). MD notified. Orders for abd xray and to hold tf's. After xray reviewed, Per MD (willis), Hold TF for tonight and address in AM. Will continue to monitor. No s/s of respiratory distress. VSS. Pt resting comfortably in bed.

## 2019-02-26 NOTE — Progress Notes (Signed)
Pt back from rad.  dobhoff  Reinserted and pos in fundus of stomach.  Spoke with leann dietician re resuming  Tube feeds pre dr Verdell Carmine and we will restart tube  Feeds as before at 15 ml /hr.

## 2019-02-27 LAB — BASIC METABOLIC PANEL
Anion gap: 7 (ref 5–15)
BUN: 40 mg/dL — ABNORMAL HIGH (ref 8–23)
CO2: 20 mmol/L — ABNORMAL LOW (ref 22–32)
Calcium: 7.2 mg/dL — ABNORMAL LOW (ref 8.9–10.3)
Chloride: 110 mmol/L (ref 98–111)
Creatinine, Ser: 1.2 mg/dL — ABNORMAL HIGH (ref 0.44–1.00)
GFR calc Af Amer: 50 mL/min — ABNORMAL LOW (ref >60)
GFR calc non Af Amer: 43 mL/min — ABNORMAL LOW (ref >60)
Glucose, Bld: 147 mg/dL — ABNORMAL HIGH (ref 70–99)
Potassium: 4.1 mmol/L (ref 3.5–5.1)
Sodium: 137 mmol/L (ref 135–145)

## 2019-02-27 LAB — GLUCOSE, CAPILLARY
Glucose-Capillary: 151 mg/dL — ABNORMAL HIGH (ref 70–99)
Glucose-Capillary: 136 mg/dL — ABNORMAL HIGH (ref 70–99)
Glucose-Capillary: 119 mg/dL — ABNORMAL HIGH (ref 70–99)
Glucose-Capillary: 78 mg/dL (ref 70–99)
Glucose-Capillary: 126 mg/dL — ABNORMAL HIGH (ref 70–99)

## 2019-02-27 MED ORDER — OSMOLITE 1.5 CAL PO LIQD
1000.0000 mL | ORAL | Status: DC
  Administered 2019-02-27: 1000 mL

## 2019-02-27 MED ORDER — SODIUM CHLORIDE 0.9 % IV SOLN
INTRAVENOUS | Status: DC
  Administered 2019-02-27 – 2019-03-03 (×9): via INTRAVENOUS

## 2019-02-27 NOTE — Progress Notes (Signed)
Nutrition Follow-up  DOCUMENTATION CODES:   Not applicable  INTERVENTION:  Advance to Osmolite 1.5 Cal at 30 mL/hr today.  If patient tolerates overnight recommend advancing tomorrow to Osmolite 1.5 Cal at 45 mL/hr (goal rate). Provides 1620 kcal, 73 grams of protein, 821 mL H2O daily.  Continue minimum free water flush of 30 mL Q4hrs.  Continue MVI daily, B-complex with C daily, vitamin C 250 mg BID. Will continue to monitor for niacin and B6 lab results.  Patient remains at risk for refeeding syndrome. Continue monitoring potassium, magnesium, and phosphorus and replacing as needed.  NUTRITION DIAGNOSIS:   Inadequate oral intake related to acute illness(thrush, nausea, vomiting) as evidenced by meal completion < 25%.  Ongoing - addressing with enteral nutrition.  GOAL:   Patient will meet greater than or equal to 90% of their needs  Progressing with enteral nutrition.  MONITOR:   PO intake, Supplement acceptance, Labs, Weight trends, Skin, I & O's  REASON FOR ASSESSMENT:   Consult Assessment of nutrition requirement/status  ASSESSMENT:   78 y.o. female with a history of psoriatic arthritis on methotrexate presented to the emergency room on 02/16/2019 with 1 week history of sore throat, difficulty swallowing, blood in urine and rash.  On evening of 6/29 patient experienced reflux and frequent swallowing during medication administration per NG tube. Also had cream colored fluid in mouth that resembled tube feeds. Abdominal x-ray was taken and tube feeds were held. On 6/30 NGT was repositioned. Tube could not be advanced after multiple attempts and still remains in hiatal hernia portion of stomach. Findings include organoaxial volvulus of stomach without GOO. Trickle feeds were resumed.  Patient sleeping this morning at time of RD assessment. Per RN patient tolerated feeds overnight. She also had a few sips of milk yesterday. Discussed with MD over secure chat. Plan is to  advance tube feeds to 30 mL/hr today and assess tolerance.  Enteral Access: Dobbhoff tube placed by IR 6/29 and repositioned on 6/30; tip terminates within hiatal hernia portion of stomach  Medications reviewed and include: B-complex with C 1 tablet daily, folic acid 1 mg daily, free water flush 30 mL Q4hrs, MVI daily, Flomax, thiamine 100 mg daily, vitamin C 250 mg BID.  Labs reviewed: CBG 108-151, CO2 20, BUN 40, Creatinine 1.2. Still pending niacin and B6 labs.  I/O: 200 mL UOP yesterday + 2 occurrences unmeasured UOP  No weight taken since admission to trend.  Diet Order:   Diet Order            Diet full liquid Room service appropriate? Yes with Assist; Fluid consistency: Thin  Diet effective now             EDUCATION NEEDS:   Not appropriate for education at this time  Skin:  Skin Assessment: Reviewed RN Assessment(ecchymosis, rash, Stage I sacrum)  Last BM:  Unknown  Height:   Ht Readings from Last 1 Encounters:  02/16/19 5\' 4"  (1.626 m)   Weight:   Wt Readings from Last 1 Encounters:  02/16/19 59.1 kg   Ideal Body Weight:  54.5 kg  BMI:  Body mass index is 22.35 kg/m.  Estimated Nutritional Needs:   Kcal:  1400-1600kcal/day  Protein:  70-80g/day  Fluid:  >1.4L/day  Willey Blade, MS, RD, LDN Office: [phone] Pager: [phone] After Hours/Weekend Pager: [phone]

## 2019-02-27 NOTE — Care Management Important Message (Signed)
Important Message  Patient Details  Name: Claudia Case MRN: [id number] Date of Birth: 03/06/1941   Medicare Important Message Given:  Yes     [name] 02/27/2019, 12:10 PM

## 2019-02-27 NOTE — Progress Notes (Signed)
[facility] [place] [facility]     PATIENT NAME: Claudia ShorterLelia Case    MR#:  [id number]  DATE OF BIRTH:  Jun 28, 1941  SUBJECTIVE:   Patient here due to severe stomatitis/pancytopenia secondary to methotrexate toxicity.  The patient tolerated tube feeding.  REVIEW OF SYSTEMS:    Review of Systems  Constitutional: Positive for malaise/fatigue. Negative for chills and fever.  HENT: Negative for congestion and tinnitus.        Mouth sore.  Eyes: Negative for blurred vision and double vision.  Respiratory: Negative for cough, shortness of breath and wheezing.   Cardiovascular: Negative for chest pain, orthopnea and PND.  Gastrointestinal: Negative for abdominal pain, diarrhea, nausea and vomiting.  Genitourinary: Negative for dysuria and hematuria.  Neurological: Positive for weakness (generalized). Negative for dizziness, sensory change and focal weakness.  All other systems reviewed and are negative.   Nutrition: Full Liquid Tolerating Diet: but very little.  Tolerating PT: Await Eval.   DRUG ALLERGIES:  No Known Allergies  VITALS:  Blood pressure (!) 130/57, pulse 66, temperature 97.8 F (36.6 C), temperature source Oral, resp. rate 20, height 5\' 4"  (1.626 m), weight 59.1 kg, SpO2 99 %.  PHYSICAL EXAMINATION:   Physical Exam  GENERAL:  78 y.o.-year-old thin patient lying in bed in no acute distress.  EYES: Pupils equal, round, reactive to light and accommodation. No scleral icterus. Extraocular muscles intact.  HEENT: Head atraumatic, normocephalic. Severe Mucositis and dry oral mucosa. dobhoff tube in place.   NECK:  Supple, no jugular venous distention. No thyroid enlargement, no tenderness.  LUNGS: Poor Resp. effort, no wheezing, rales, rhonchi. No use of accessory muscles of respiration.  CARDIOVASCULAR: S1, S2 normal. No murmurs, rubs, or gallops.  ABDOMEN: Soft, nontender, nondistended. Bowel sounds present. No organomegaly or mass.  EXTREMITIES: No  cyanosis, clubbing or edema b/l.    NEUROLOGIC: Cranial nerves II through XII are intact. No focal Motor or sensory deficits b/l. Globally weak.    PSYCHIATRIC: The patient is alert and oriented x 3.  SKIN: No obvious rash, lesion, or ulcer.    LABORATORY PANEL:   CBC Recent Labs  Lab 02/26/19 0529  WBC 8.7  HGB 9.0*  HCT 27.9*  PLT 297   ------------------------------------------------------------------------------------------------------------------  Chemistries  Recent Labs  Lab 02/21/19 0645  02/27/19 0614  NA 146*   < > 137  K 3.6   < > 4.1  CL 118*   < > 110  CO2 22   < > 20*  GLUCOSE 174*   < > 147*  BUN 25*   < > 40*  CREATININE 0.51   < > 1.20*  CALCIUM 7.5*   < > 7.2*  AST 52*  --   --   ALT 34  --   --   ALKPHOS 55  --   --   BILITOT 3.6*  --   --    < > = values in this interval not displayed.   ------------------------------------------------------------------------------------------------------------------  Cardiac Enzymes No results for input(s): TROPONINI in the last 168 hours. ------------------------------------------------------------------------------------------------------------------  RADIOLOGY:  Dg Abd 1 View  Result Date: 02/26/2019 CLINICAL DATA:  Feeding tube placement EXAM: ABDOMEN - 1 VIEW COMPARISON:  None. FINDINGS: Feeding tube coils in the lower chest, likely within the large hiatal hernia. Nonobstructive bowel gas pattern. Heart is borderline in size. Bibasilar atelectasis or infiltrates. No visible effusions. IMPRESSION: Feeding tube coils in the lower chest is within the large hiatal hernia. Bibasilar atelectasis or  infiltrates. Electronically Signed   By: Rolm Baptise M.D.   On: 02/26/2019 00:40   Dg Addison Bailey G Tube Plc W/fl W/rad  Result Date: 02/26/2019 CLINICAL DATA:  Dobbhoff tube placed yesterday which was coiled in the hiatal hernia. Patient presents for repositioning and attempted advancement into the proximal small bowel.  EXAM: NASO G TUBE PLACEMENT WITH FL AND WITH RAD CONTRAST:  30 mL Omnipaque 300 FLUOROSCOPY TIME:  Fluoroscopy Time:  10.7 minute Radiation Exposure Index (if provided by the fluoroscopic device): 100.3 mGy Number of Acquired Spot Images: 0 COMPARISON:  None. FINDINGS: Real-time fluoroscopy was utilized for repositioning of a Dobbhoff tube. Dobbhoff tube was within a large hiatal hernia. Multiple attempts were made at repositioning the Dobbhoff tube prior to and following reinsert shin of the stylet. 30 mL Omnipaque 300 was hand injected through the Dobbhoff tube for opacification of the stomach for better delineation of the anatomy. There is organoaxial volvulus of the stomach without gastric outlet obstruction. The Dobbhoff tube continually becomes coiled within the fundus of the stomach and could not be advanced into the body or antrum of the stomach. Tip of the Dobbhoff tube could not be advanced into the small bowel after multiple attempts at manipulation of the tube. The tube was left in place with the tip within hiatal hernia portion of the stomach. IMPRESSION: Dobbhoff tube with the tip in the fundus of the stomach. The tube could not be advanced into the post pyloric position. Large hiatal hernia with the entirety of the stomach within the thorax. 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 Electronically Signed   By: Kathreen Devoid   On: 02/26/2019 11:44     ASSESSMENT AND PLAN:   78 year old female with past medical history of psoriatic arthritis, psoriasis, hypertension hyperlipidemia, GERD who presented to the hospital due to neutropenic fevers.  1.  Neutropenic fever-initially thought to have sepsis but that has been ruled out.  Patient was empirically placed on broad-spectrum IV antibiotics and antifungals which have all been discontinued.  Cultures remain negative.  Neutropenia and fever has resolved.  2.  Pancytopenia-secondary to toxicity from methotrexate. -  s/p Leukovorin. improved.   3.  Elevated LFTs/jaundice-much improved and resolved.  Patient's bilirubins and LFTs have normalized.  This was secondary to toxicity from methotrexate.  Ultrasound showed no other acute pathology.  4.  Severe mucositis/thrush- continue nystatin, viscous lidocaine, chlorhexidine. -Seen by ENT and they did not recommend any surgical intervention.  Continue supportive care with meds as mentioned above. -Status post Dobbhoff tube placement and started on tube feeds but patient had some nausea and vomiting overnight and therefore x-ray overnight showing that the Dobbhoff tube was coiled in the hiatal hernia.  Patient had her Dobbhoff tube repositioned.  She tolerated tube feedings so far. - cont. Vitamin supplements with C and thiamine.  Slow to improve and may take 2-3 weeks as per ENT.   5.  Failure to thrive/poor p.o. intake-secondary to severe mucositis/thrush.  - Dobbhoff tube repositioned and esumed tube feedings.  6.  Hypernatremia- improved with D5W. Follow electrolytes.  Dehydration.  IV fluid support and follow-up BMP.  disCussed plan of care with patient's sister over the phone.   All the records are reviewed and case discussed with Care Management/Social Worker. Management plans discussed with the patient, family and they are in agreement.  CODE STATUS: Full code  DVT Prophylaxis: Ted's & SCD's.   TOTAL TIME TAKING CARE OF THIS PATIENT: 33 minutes.  POSSIBLE D/C IN 3 DAYS, DEPENDING ON CLINICAL CONDITION.   Shaune PollackQing Frimy Uffelman M.D on 02/27/2019 at 1:46 PM  Between 7am to 6pm - Pager - [phone]  After 6pm go to [email or web address] - Social research officer, governmentpassword [email or web address] [email or web address]  [facility] [place] [facility]  Office  [phone]  CC: Primary care physician; Sula RumpleVirk, Charanjit, MD

## 2019-02-28 LAB — BASIC METABOLIC PANEL
Anion gap: 5 (ref 5–15)
BUN: 45 mg/dL — ABNORMAL HIGH (ref 8–23)
CO2: 21 mmol/L — ABNORMAL LOW (ref 22–32)
Calcium: 7.4 mg/dL — ABNORMAL LOW (ref 8.9–10.3)
Chloride: 114 mmol/L — ABNORMAL HIGH (ref 98–111)
Creatinine, Ser: 1.19 mg/dL — ABNORMAL HIGH (ref 0.44–1.00)
GFR calc Af Amer: 51 mL/min — ABNORMAL LOW (ref >60)
GFR calc non Af Amer: 44 mL/min — ABNORMAL LOW (ref >60)
Glucose, Bld: 168 mg/dL — ABNORMAL HIGH (ref 70–99)
Potassium: 4.3 mmol/L (ref 3.5–5.1)
Sodium: 140 mmol/L (ref 135–145)

## 2019-02-28 LAB — MAGNESIUM: Magnesium: 2.4 mg/dL (ref 1.7–2.4)

## 2019-02-28 LAB — GLUCOSE, CAPILLARY
Glucose-Capillary: 147 mg/dL — ABNORMAL HIGH (ref 70–99)
Glucose-Capillary: 148 mg/dL — ABNORMAL HIGH (ref 70–99)
Glucose-Capillary: 149 mg/dL — ABNORMAL HIGH (ref 70–99)
Glucose-Capillary: 152 mg/dL — ABNORMAL HIGH (ref 70–99)
Glucose-Capillary: 156 mg/dL — ABNORMAL HIGH (ref 70–99)
Glucose-Capillary: 156 mg/dL — ABNORMAL HIGH (ref 70–99)
Glucose-Capillary: 159 mg/dL — ABNORMAL HIGH (ref 70–99)

## 2019-02-28 MED ORDER — OSMOLITE 1.5 CAL PO LIQD
1000.0000 mL | ORAL | Status: DC
  Administered 2019-02-28 – 2019-03-01 (×2): 1000 mL

## 2019-02-28 NOTE — Progress Notes (Signed)
Physical Therapy Treatment Patient Details Name: Claudia Case MRN: [id number] DOB: [birth year] Today's Date: 02/28/2019    History of Present Illness Patient is a 78 year old female that presents with weakness, mouth sores, pain in her hands, and nausea/vomiting. She was found to have pancytopenia, elevated bilirubin levels. She has a history of RA, which has been managed with methotrexate.    PT Comments    Pt is making limited progress towards goals. Pt still remains very confused this date, unable to answer phone when it was ringing and she is confused to place. Takes encouragement from therapist to participate in therapy, however ultimately does well with ambulation. Still needs hands on assist for safe movement and cues for attention to task with there-ex. Voice appears stronger this date. Continue to recommend SNF placement. Will progress as able.   Follow Up Recommendations  SNF;Supervision for mobility/OOB     Equipment Recommendations  Rolling walker with 5" wheels    Recommendations for Other Services       Precautions / Restrictions Precautions Precautions: Fall Restrictions Weight Bearing Restrictions: No    Mobility  Bed Mobility Overal bed mobility: Needs Assistance Bed Mobility: Supine to Sit     Supine to sit: Min assist     General bed mobility comments: needs assist to initiate transfer. Once seated at EOB, able to sit upright.  Transfers Overall transfer level: Needs assistance Equipment used: Rolling walker (2 wheeled) Transfers: Sit to/from Stand Sit to Stand: Min assist         General transfer comment: has difficulty with hand placement for transfer. Needs bed elevated for ease. Slightly forward flexed posture  Ambulation/Gait Ambulation/Gait assistance: Min assist Gait Distance (Feet): 40 Feet Assistive device: Rolling walker (2 wheeled) Gait Pattern/deviations: Step-to pattern     General Gait Details: takes encouragement to ambulate,  however does well. Somewhat self limiting in distance. Needs cues for safe guidance of RW   Stairs             Wheelchair Mobility    Modified Rankin (Stroke Patients Only)       Balance Overall balance assessment: Needs assistance Sitting-balance support: Feet supported Sitting balance-Leahy Scale: Fair     Standing balance support: Bilateral upper extremity supported Standing balance-Leahy Scale: Fair                              Cognition Arousal/Alertness: Awake/alert Behavior During Therapy: WFL for tasks assessed/performed Overall Cognitive Status: Impaired/Different from baseline                                        Exercises Other Exercises Other Exercises: supine ther-ex performed including B LE AP, SLRs, SAQ, and hip abd/add. 12 reps with min assist and cues for attention to task    General Comments        Pertinent Vitals/Pain Pain Assessment: No/denies pain    Home Living                      Prior Function            PT Goals (current goals can now be found in the care plan section) Acute Rehab PT Goals Patient Stated Goal: To return home PT Goal Formulation: With patient Time For Goal Achievement: 03/04/19 Potential to Achieve Goals: Good Progress  towards PT goals: Progressing toward goals    Frequency    Min 2X/week      PT Plan Current plan remains appropriate    Co-evaluation              AM-PAC PT "6 Clicks" Mobility   Outcome Measure  Help needed turning from your back to your side while in a flat bed without using bedrails?: A Lot Help needed moving from lying on your back to sitting on the side of a flat bed without using bedrails?: A Little Help needed moving to and from a bed to a chair (including a wheelchair)?: A Little Help needed standing up from a chair using your arms (e.g., wheelchair or bedside chair)?: A Little Help needed to walk in hospital room?: A  Little Help needed climbing 3-5 steps with a railing? : A Lot 6 Click Score: 16    End of Session Equipment Utilized During Treatment: Gait belt Activity Tolerance: Patient tolerated treatment well Patient left: in chair;with call bell/phone within reach;with chair alarm set Nurse Communication: Mobility status PT Visit Diagnosis: Unsteadiness on feet (R26.81);Difficulty in walking, not elsewhere classified (R26.2)     Time: [id number] PT Time Calculation (min) (ACUTE ONLY): 28 min  Charges:  $Gait Training: 8-22 mins $Therapeutic Exercise: 8-22 mins                     Elizabeth PalauStephanie Dontario Evetts, PT, DPT [phone]    Amritpal Shropshire 02/28/2019, 11:00 AM

## 2019-02-28 NOTE — Progress Notes (Signed)
[facility] [place] [facility]     PATIENT NAME: Claudia ShorterLelia Case    MR#:  [id number]  DATE OF BIRTH:  12/05/1940  SUBJECTIVE:   Patient here due to severe stomatitis/pancytopenia secondary to methotrexate toxicity.  The patient tolerated tube feeding. REVIEW OF SYSTEMS:    Review of Systems  Constitutional: Positive for malaise/fatigue. Negative for chills and fever.  HENT: Negative for congestion and tinnitus.        Mouth sore.  Eyes: Negative for blurred vision and double vision.  Respiratory: Negative for cough, shortness of breath and wheezing.   Cardiovascular: Negative for chest pain, orthopnea and PND.  Gastrointestinal: Negative for abdominal pain, diarrhea, nausea and vomiting.  Genitourinary: Negative for dysuria and hematuria.  Neurological: Positive for weakness (generalized). Negative for dizziness, sensory change and focal weakness.  All other systems reviewed and are negative.   Nutrition: Full Liquid Tolerating Diet: but very little.  Tolerating PT: Await Eval.   DRUG ALLERGIES:  No Known Allergies  VITALS:  Blood pressure 124/85, pulse 91, temperature 97.7 F (36.5 C), temperature source Oral, resp. rate 19, height 5\' 4"  (1.626 m), weight 59.1 kg, SpO2 96 %.  PHYSICAL EXAMINATION:   Physical Exam  GENERAL:  78 y.o.-year-old thin patient lying in bed in no acute distress.  EYES: Pupils equal, round, reactive to light and accommodation. No scleral icterus. Extraocular muscles intact.  HEENT: Head atraumatic, normocephalic. Severe Mucositis and dry oral mucosa. dobhoff tube in place.   NECK:  Supple, no jugular venous distention. No thyroid enlargement, no tenderness.  LUNGS: Poor Resp. effort, no wheezing, rales, rhonchi. No use of accessory muscles of respiration.  CARDIOVASCULAR: S1, S2 normal. No murmurs, rubs, or gallops.  ABDOMEN: Soft, nontender, nondistended. Bowel sounds present. No organomegaly or mass.  EXTREMITIES: No  cyanosis, clubbing or edema b/l.    NEUROLOGIC: Cranial nerves II through XII are intact. No focal Motor or sensory deficits b/l. Globally weak.    PSYCHIATRIC: The patient is alert and oriented x 3.  SKIN: No obvious rash, lesion, or ulcer.  LABORATORY PANEL:   CBC Recent Labs  Lab 02/26/19 0529  WBC 8.7  HGB 9.0*  HCT 27.9*  PLT 297   ------------------------------------------------------------------------------------------------------------------  Chemistries  Recent Labs  Lab 02/28/19 0247  NA 140  K 4.3  CL 114*  CO2 21*  GLUCOSE 168*  BUN 45*  CREATININE 1.19*  CALCIUM 7.4*  MG 2.4   ------------------------------------------------------------------------------------------------------------------  Cardiac Enzymes No results for input(s): TROPONINI in the last 168 hours. ------------------------------------------------------------------------------------------------------------------  RADIOLOGY:  No results found.   ASSESSMENT AND PLAN:   78 year old female with past medical history of psoriatic arthritis, psoriasis, hypertension hyperlipidemia, GERD who presented to the hospital due to neutropenic fevers.  1.  Neutropenic fever-initially thought to have sepsis but that has been ruled out.  Patient was empirically placed on broad-spectrum IV antibiotics and antifungals which have all been discontinued.  Cultures remain negative.  Neutropenia and fever has resolved.  2.  Pancytopenia-secondary to toxicity from methotrexate. - s/p Leukovorin. improved.   3.  Elevated LFTs/jaundice-much improved and resolved.  Patient's bilirubins and LFTs have normalized.  This was secondary to toxicity from methotrexate.  Ultrasound showed no other acute pathology.  4.  Severe mucositis/thrush- continue nystatin, viscous lidocaine, chlorhexidine. -Seen by ENT and they did not recommend any surgical intervention.  Continue supportive care with meds as mentioned above. -Status  post Dobbhoff tube placement and started on tube feeds but patient had  some nausea and vomiting overnight and therefore x-ray overnight showing that the Dobbhoff tube was coiled in the hiatal hernia.  Patient had her Dobbhoff tube repositioned.  She tolerated tube feedings so far. - cont. Vitamin supplements with C and thiamine.  Slow to improve and may take 2-3 weeks as per ENT.   5.  Failure to thrive/poor p.o. intake-secondary to severe mucositis/thrush.  - Dobbhoff tube repositioned and esumed tube feedings.  6.  Hypernatremia- improved with D5W. Follow electrolytes.  Dehydration.  IV fluid support and follow-up BMP.  disCussed plan of care with patient's sister over the phone.  All the records are reviewed and Case discussed with Care Management/Social Worker. Management plans discussed with the patient, family and they are in agreement.  CODE STATUS: Full code  DVT Prophylaxis: Ted's & SCD's.   TOTAL TIME TAKING CARE OF THIS PATIENT: 26 minutes.   POSSIBLE D/C IN 3 DAYS, DEPENDING ON CLINICAL CONDITION.  Demetrios Loll M.D on 02/28/2019 at 1:18 PM  Between 7am to 6pm - Pager - [phone]  After 6pm go to [email or web address] - Proofreader  [facility] [place] [facility]  Office  [phone]  CC: Primary care physician; Sherrin Daisy, MD

## 2019-03-01 LAB — BASIC METABOLIC PANEL
Anion gap: 4 — ABNORMAL LOW (ref 5–15)
BUN: 48 mg/dL — ABNORMAL HIGH (ref 8–23)
CO2: 19 mmol/L — ABNORMAL LOW (ref 22–32)
Calcium: 7.2 mg/dL — ABNORMAL LOW (ref 8.9–10.3)
Chloride: 116 mmol/L — ABNORMAL HIGH (ref 98–111)
Creatinine, Ser: 1.28 mg/dL — ABNORMAL HIGH (ref 0.44–1.00)
GFR calc Af Amer: 46 mL/min — ABNORMAL LOW (ref >60)
GFR calc non Af Amer: 40 mL/min — ABNORMAL LOW (ref >60)
Glucose, Bld: 179 mg/dL — ABNORMAL HIGH (ref 70–99)
Potassium: 4.7 mmol/L (ref 3.5–5.1)
Sodium: 139 mmol/L (ref 135–145)

## 2019-03-01 LAB — GLUCOSE, CAPILLARY
Glucose-Capillary: 134 mg/dL — ABNORMAL HIGH (ref 70–99)
Glucose-Capillary: 142 mg/dL — ABNORMAL HIGH (ref 70–99)
Glucose-Capillary: 146 mg/dL — ABNORMAL HIGH (ref 70–99)
Glucose-Capillary: 179 mg/dL — ABNORMAL HIGH (ref 70–99)
Glucose-Capillary: 182 mg/dL — ABNORMAL HIGH (ref 70–99)
Glucose-Capillary: 185 mg/dL — ABNORMAL HIGH (ref 70–99)

## 2019-03-01 LAB — MISC LABCORP TEST (SEND OUT): Labcorp test code: 4655

## 2019-03-01 MED ORDER — BLISTEX MEDICATED EX OINT
TOPICAL_OINTMENT | CUTANEOUS | Status: DC | PRN
  Administered 2019-03-04: via TOPICAL
  Administered 2019-03-05: 1 via TOPICAL
  Administered 2019-03-05 – 2019-03-08 (×3): via TOPICAL
  Filled 2019-03-01: qty 7
  Filled 2019-03-01: qty 6.3

## 2019-03-01 NOTE — NC FL2 (Signed)
[place] LEVEL OF CARE SCREENING TOOL     IDENTIFICATION  Patient Name: Claudia Case Birthdate: 12/05/1940 Sex: female Admission Date (Current Location): 02/16/2019  [place] and [place] Number:  Engineering geologist and Address:  [facility], [address] [place], [place] [address]      Provider Number: [id number]  Attending Physician Name and Address:  Demetrios Loll, MD  Relative Name and Phone Number:       Current Level of Care: Hospital Recommended Level of Care: [place] Prior Approval Number:    Date Approved/Denied:   PASRR Number: [id number]  Discharge Plan: SNF    Current Diagnoses: Patient Active Problem List   Diagnosis Date Noted  . Neutropenic fever ([place]) 02/16/2019    Orientation RESPIRATION BLADDER Height & Weight     Self, Time, Situation, Place  Normal Continent Weight: 130 lb 3.2 oz (59.1 kg) Height:  5\' 4"  (162.6 cm)  BEHAVIORAL SYMPTOMS/MOOD NEUROLOGICAL BOWEL NUTRITION STATUS      Incontinent Diet(Diet: Full Liquid to be advanced.)  AMBULATORY STATUS COMMUNICATION OF NEEDS Skin   Extensive Assist Verbally Normal                       Personal Care Assistance Level of Assistance  Bathing, Feeding, Dressing Bathing Assistance: Limited assistance Feeding assistance: Independent Dressing Assistance: Limited assistance     Functional Limitations Info  Sight, Hearing, Speech Sight Info: Adequate Hearing Info: Adequate Speech Info: Adequate    SPECIAL CARE FACTORS FREQUENCY  PT (By licensed PT), OT (By licensed OT)     PT Frequency: 5 OT Frequency: 5            Contractures      Additional Factors Info  Code Status, Allergies Code Status Info: Full Code. Allergies Info: No Known Allergies.           Current Medications (03/01/2019):  This is the current hospital active medication list Current Facility-Administered Medications  Medication Dose Route Frequency  Provider Last Rate Last Dose  . 0.9 %  sodium chloride infusion   Intravenous Continuous Demetrios Loll, MD 75 mL/hr at 03/01/19 0344    . acetaminophen (TYLENOL) tablet 650 mg  650 mg Oral Q6H PRN Loletha Grayer, MD   650 mg at 03/01/19 2725   Or  . acetaminophen (TYLENOL) suppository 650 mg  650 mg Rectal Q6H PRN Loletha Grayer, MD   650 mg at 02/17/19 2045  . acetaminophen (TYLENOL) tablet 650 mg  650 mg Oral Once PRN Schuyler Amor, MD      . B-complex with vitamin C tablet 1 tablet  1 tablet Oral Daily [name], MD   1 tablet at 02/28/19 1043  . feeding supplement (OSMOLITE 1.5 CAL) liquid 1,000 mL  1,000 mL Per Tube Continuous Demetrios Loll, MD 45 mL/hr at 02/28/19 1929 1,000 mL at 36/64/40 3474  . folic acid (FOLVITE) tablet 1 mg  1 mg Oral Daily [name], MD   1 mg at 03/01/19 0937  . free water 30 mL  30 mL Per Tube Q4H Henreitta Leber, MD   30 mL at 03/01/19 0800  . lidocaine (XYLOCAINE) 2 % viscous mouth solution 15 mL  15 mL Mouth/Throat Q6H PRN [name], MD   15 mL at 02/24/19 2250  . magic mouthwash  5 mL Oral QID PRN [name], MD   5 mL at 02/27/19 1121   And  . lidocaine (  XYLOCAINE) 2 % viscous mouth solution 5 mL  5 mL Mouth/Throat QID PRN Altamese DillingVachhani, Vaibhavkumar, MD   5 mL at 03/01/19 0930  . lip balm (BLISTEX) ointment   Topical PRN Shaune Pollackhen, Qing, MD      . miconazole (MICOTIN) 2 % cream   Topical Daily Altamese DillingVachhani, Vaibhavkumar, MD      . multivitamin with minerals tablet 1 tablet  1 tablet Oral Daily Altamese DillingVachhani, Vaibhavkumar, MD   1 tablet at 02/28/19 1506  . nystatin (MYCOSTATIN/NYSTOP) topical powder   Topical BID Altamese DillingVachhani, Vaibhavkumar, MD      . ondansetron [facility](ZOFRAN) tablet 4 mg  4 mg Oral Q6H PRN Alford HighlandWieting, Richard, MD   4 mg at 02/25/19 1115   Or  . ondansetron (ZOFRAN) injection 4 mg  4 mg Intravenous Q6H PRN Wieting, Richard, MD      . sodium chloride flush (NS) 0.9 % injection 10-40 mL  10-40 mL Intracatheter PRN Kandyce RudKernodle,  George W Jr., MD      . tamsulosin [facility](FLOMAX) capsule 0.4 mg  0.4 mg Oral Daily Altamese DillingVachhani, Vaibhavkumar, MD   0.4 mg at 03/01/19 [id number]  . thiamine (VITAMIN B-1) tablet 100 mg  100 mg Oral Daily Altamese DillingVachhani, Vaibhavkumar, MD   100 mg at 03/01/19 0937  . vitamin C (ASCORBIC ACID) tablet 250 mg  250 mg Oral BID Altamese DillingVachhani, Vaibhavkumar, MD   250 mg at 03/01/19 [id number]     Discharge Medications: Please see discharge summary for a list of discharge medications.  Relevant Imaging Results:  Relevant Lab Results:   Additional Information SSN: [id number]  Rawley Harju, Darleen CrockerBailey M, LCSW

## 2019-03-01 NOTE — Progress Notes (Signed)
[facility] - [place] at [facility]     PATIENT NAME: Claudia Case    MR#:  [id number]  DATE OF BIRTH:  [birth year]  SUBJECTIVE:   Patient here due to severe stomatitis/pancytopenia secondary to methotrexate toxicity.  The patient has no complaints and tolerated tube feeding so far. REVIEW OF SYSTEMS:    Review of Systems  Constitutional: Positive for malaise/fatigue. Negative for chills and fever.  HENT: Negative for congestion and tinnitus.        Mouth sore.  Eyes: Negative for blurred vision and double vision.  Respiratory: Negative for cough, shortness of breath and wheezing.   Cardiovascular: Negative for chest pain, orthopnea and PND.  Gastrointestinal: Negative for abdominal pain, diarrhea, nausea and vomiting.  Genitourinary: Negative for dysuria and hematuria.  Neurological: Positive for weakness (generalized). Negative for dizziness, sensory change and focal weakness.  All other systems reviewed and are negative.  Nutrition: Full Liquid Tolerating Diet: but very little.  DRUG ALLERGIES:  No Known Allergies  VITALS:  Blood pressure (!) 125/47, pulse 81, temperature 99.9 F (37.7 C), temperature source Oral, resp. rate (!) 22, height 5\' 4"  (1.626 m), weight 59.1 kg, SpO2 96 %.  PHYSICAL EXAMINATION:   Physical Exam  GENERAL:  78 y.o.-year-old thin patient lying in bed in no acute distress.  Looks fragile. EYES: Pupils equal, round, reactive to light and accommodation. No scleral icterus. Extraocular muscles intact.  HEENT: Head atraumatic, normocephalic. Severe Mucositis and dry oral mucosa. dobhoff tube in place.   NECK:  Supple, no jugular venous distention. No thyroid enlargement, no tenderness.  LUNGS: Poor Resp. effort, no wheezing, rales, rhonchi. No use of accessory muscles of respiration.  CARDIOVASCULAR: S1, S2 normal. No murmurs, rubs, or gallops.  ABDOMEN: Soft, nontender, nondistended. Bowel sounds present. No organomegaly or mass.   EXTREMITIES: No cyanosis, clubbing or edema b/l.    NEUROLOGIC: Cranial nerves II through XII are intact. No focal Motor or sensory deficits b/l. Globally weak.    PSYCHIATRIC: The patient is alert and oriented x 3.  SKIN: No obvious rash, lesion, or ulcer.  LABORATORY PANEL:   CBC Recent Labs  Lab 02/26/19 0529  WBC 8.7  HGB 9.0*  HCT 27.9*  PLT 297   ------------------------------------------------------------------------------------------------------------------  Chemistries  Recent Labs  Lab 02/28/19 0247 03/01/19 0247  NA 140 139  K 4.3 4.7  CL 114* 116*  CO2 21* 19*  GLUCOSE 168* 179*  BUN 45* 48*  CREATININE 1.19* 1.28*  CALCIUM 7.4* 7.2*  MG 2.4  --    ------------------------------------------------------------------------------------------------------------------  Cardiac Enzymes No results for input(s): TROPONINI in the last 168 hours. ------------------------------------------------------------------------------------------------------------------  RADIOLOGY:  No results found.   ASSESSMENT AND PLAN:   78 year old female with past medical history of psoriatic arthritis, psoriasis, hypertension hyperlipidemia, GERD who presented to the hospital due to neutropenic fevers.  1.  Neutropenic fever-initially thought to have sepsis but that has been ruled out.  Patient was empirically placed on broad-spectrum IV antibiotics and antifungals which have all been discontinued.  Cultures remain negative.  Neutropenia and fever has resolved.  2.  Pancytopenia-secondary to toxicity from methotrexate. - s/p Leukovorin. improved.   3.  Elevated LFTs/jaundice-much improved and resolved.  Patient's bilirubins and LFTs have normalized.  This was secondary to toxicity from methotrexate.  Ultrasound showed no other acute pathology.  4.  Severe mucositis/thrush- continue nystatin, viscous lidocaine, chlorhexidine. -Seen by ENT and they did not recommend any surgical  intervention.  Continue supportive care with meds  as mentioned above. -Status post Dobbhoff tube placement and started on tube feeds but patient had some nausea and vomiting and therefore x-ray overnight showing that the Dobbhoff tube was coiled in the hiatal hernia.  Patient had her Dobbhoff tube repositioned.  She tolerated tube feedings so far. - cont. Vitamin supplements with C and thiamine.  Slow to improve and may take 2-3 weeks as per ENT.   5.  Failure to thrive/poor p.o. intake-secondary to severe mucositis/thrush.  - Dobbhoff tube repositioned and resumed tube feedings.  6.  Hypernatremia- improved with D5W.  Dehydration.  Continue IV fluid support and follow-up BMP.  Discussed plan of care with patient's sister over the phone.  All the records are reviewed and case discussed with Care Management/Social Worker. Management plans discussed with the patient, family and they are in agreement.  CODE STATUS: Full code  DVT Prophylaxis: Ted's & SCD's.   TOTAL TIME TAKING CARE OF THIS PATIENT: 26 minutes.   POSSIBLE D/C IN 3 DAYS, DEPENDING ON CLINICAL CONDITION.  Demetrios Loll M.D on 03/01/2019 at 2:31 PM  Between 7am to 6pm - Pager - [phone]  After 6pm go to [email or web address] - Proofreader  [facility] [place] [facility]  Office  [phone]  CC: Primary care physician; Sherrin Daisy, MD

## 2019-03-01 NOTE — TOC Progression Note (Addendum)
Transition of Care [facility]) - Progression Note    Patient Details  Name: Claudia Case MRN: [id number] Date of Birth: 09-23-1940  Transition of [facility]) CM/SW Contact  [name], [name] Phone Number: [phone]  03/01/2019, 2:36 PM  Clinical Narrative: Clinical Social Worker (CSW) contacted patient's sister [name] to discuss D/C plan. CSW made sister aware that PT is still recommending SNF. Sister reported that she wants to bring patient home because the patient begs her to come home everyday. Per [name] she lives right beside her sister. CSW explained to [name] that if patient comes home she will need 24/7 care. [name] verbalized her understanding. CSW explained that home health will not provide 24/7 care. [name] verbalized her understanding. [name] is agreeable to SNF search in [facility] and will consider SNF. CSW will continue to follow and assist as needed.     Expected Discharge Plan: [place]    Expected Discharge Plan and Services Expected Discharge Plan: [place]   Discharge Planning Services: CM Consult Post Acute Care Choice: [place] arrangements for the past 2 months: Single Family Home                                       Social Determinants of Health (SDOH) Interventions    Readmission Risk Interventions No flowsheet data found.

## 2019-03-01 NOTE — Care Management Important Message (Signed)
Important Message  Patient Details  Name: Claudia Case MRN: [id number] Date of Birth: 12/20/1940   Medicare Important Message Given:  Yes     Juliann Pulse A Shanley Furlough 03/01/2019, 11:00 AM

## 2019-03-02 ENCOUNTER — Encounter: Payer: Self-pay | Admitting: *Deleted

## 2019-03-02 LAB — BASIC METABOLIC PANEL
Anion gap: 6 (ref 5–15)
BUN: 56 mg/dL — ABNORMAL HIGH (ref 8–23)
CO2: 18 mmol/L — ABNORMAL LOW (ref 22–32)
Calcium: 7.4 mg/dL — ABNORMAL LOW (ref 8.9–10.3)
Chloride: 115 mmol/L — ABNORMAL HIGH (ref 98–111)
Creatinine, Ser: 1.41 mg/dL — ABNORMAL HIGH (ref 0.44–1.00)
GFR calc Af Amer: 41 mL/min — ABNORMAL LOW (ref >60)
GFR calc non Af Amer: 36 mL/min — ABNORMAL LOW (ref >60)
Glucose, Bld: 196 mg/dL — ABNORMAL HIGH (ref 70–99)
Potassium: 5.4 mmol/L — ABNORMAL HIGH (ref 3.5–5.1)
Sodium: 139 mmol/L (ref 135–145)

## 2019-03-02 LAB — GLUCOSE, CAPILLARY
Glucose-Capillary: 140 mg/dL — ABNORMAL HIGH (ref 70–99)
Glucose-Capillary: 147 mg/dL — ABNORMAL HIGH (ref 70–99)
Glucose-Capillary: 149 mg/dL — ABNORMAL HIGH (ref 70–99)
Glucose-Capillary: 152 mg/dL — ABNORMAL HIGH (ref 70–99)
Glucose-Capillary: 157 mg/dL — ABNORMAL HIGH (ref 70–99)
Glucose-Capillary: 178 mg/dL — ABNORMAL HIGH (ref 70–99)

## 2019-03-02 MED ORDER — BISACODYL 10 MG RE SUPP
10.0000 mg | Freq: Every day | RECTAL | Status: DC
  Administered 2019-03-03 – 2019-03-11 (×7): 10 mg via RECTAL
  Filled 2019-03-02 (×5): qty 1

## 2019-03-02 MED ORDER — SODIUM ZIRCONIUM CYCLOSILICATE 5 G PO PACK
5.0000 g | PACK | Freq: Every day | ORAL | Status: DC
  Administered 2019-03-02 – 2019-03-03 (×2): 5 g via ORAL
  Filled 2019-03-02 (×2): qty 1

## 2019-03-02 MED ORDER — SENNA 8.6 MG PO TABS
1.0000 | ORAL_TABLET | Freq: Every day | ORAL | Status: DC | PRN
  Administered 2019-03-13: 8.6 mg via ORAL
  Filled 2019-03-02: qty 1

## 2019-03-02 MED ORDER — FUROSEMIDE 10 MG/ML IJ SOLN
20.0000 mg | Freq: Once | INTRAMUSCULAR | Status: AC
  Administered 2019-03-02: 20 mg via INTRAVENOUS
  Filled 2019-03-02: qty 2

## 2019-03-02 NOTE — Progress Notes (Signed)
Occupational Therapy Treatment Patient Details Name: Claudia Case MRN: [id number] DOB: 1941/05/10 Today's Date: 03/02/2019    History of present illness Patient is a 78 year old female that presents with weakness, mouth sores, pain in her hands, and nausea/vomiting. She was found to have pancytopenia, elevated bilirubin levels. She has a history of RA, which has been managed with methotrexate.   OT comments  Pt received in bed, requesting to use bathroom and attempting to reach for bed rails and attempting to use phone to call someone. Pt able to sit EOB with min-mod assist from therapist. Once EOB, fair static sitting balance. Pt then reporting she did not have to use the bathroom. With encouragement, pt willing to try to stand, requiring significant effort and ultimately Max A with bed elevated pt unable to stand. RN in room near end of session for medications and vitals. Pt progressing slowly, continues to benefit from skilled OT services. Continue to recommend STR.   Follow Up Recommendations  SNF    Equipment Recommendations  3 in 1 bedside commode    Recommendations for Other Services      Precautions / Restrictions Precautions Precautions: Fall Restrictions Weight Bearing Restrictions: No       Mobility Bed Mobility Overal bed mobility: Needs Assistance Bed Mobility: Supine to Sit;Sit to Supine     Supine to sit: Min assist;Mod assist Sit to supine: Mod assist;Min assist      Transfers Overall transfer level: Needs assistance Equipment used: Rolling walker (2 wheeled) Transfers: Sit to/from Stand Sit to Stand: Max assist;From elevated surface         General transfer comment: with cues for scooting EOB and hand placement, pt required significant assist and ultimately unable to lift hips off bed, despite elevated surface    Balance Overall balance assessment: Needs assistance Sitting-balance support: Feet supported Sitting balance-Leahy Scale: Fair      Standing balance support: Bilateral upper extremity supported Standing balance-Leahy Scale: Poor                             ADL either performed or assessed with clinical judgement   ADL Overall ADL's : Needs assistance/impaired                                       General ADL Comments: continues to require Max A for LB ADL, Min A for grooming, cognitive cues     Vision Baseline Vision/History: No visual deficits Patient Visual Report: No change from baseline     Perception     Praxis      Cognition Arousal/Alertness: Awake/alert Behavior During Therapy: WFL for tasks assessed/performed Overall Cognitive Status: Impaired/Different from baseline                                 General Comments: pt somewhat confused, follows commands with cues, some difficulty sequencing/initiating tasks        Exercises     Shoulder Instructions       General Comments bruising and scabby skin, particularly scabby around lips, RN aware    Pertinent Vitals/ Pain       Pain Assessment: No/denies pain  Home Living  Prior Functioning/Environment              Frequency  Min 2X/week        Progress Toward Goals  OT Goals(current goals can now be found in the care plan section)  Progress towards OT goals: Not progressing toward goals - comment(slow to progress, in part due to cognitive impairments)  Acute Rehab OT Goals Patient Stated Goal: To return home OT Goal Formulation: With patient Potential to Achieve Goals: Good  Plan Discharge plan remains appropriate;Frequency remains appropriate    Co-evaluation                 AM-PAC OT "6 Clicks" Daily Activity     Outcome Measure   Help from another person eating meals?: A Little Help from another person taking care of personal grooming?: A Little Help from another person toileting, which includes using  toliet, bedpan, or urinal?: A Lot Help from another person bathing (including washing, rinsing, drying)?: A Lot Help from another person to put on and taking off regular upper body clothing?: A Lot Help from another person to put on and taking off regular lower body clothing?: A Lot 6 Click Score: 14    End of Session Equipment Utilized During Treatment: Gait belt;Rolling walker  OT Visit Diagnosis: Muscle weakness (generalized) (M62.81)   Activity Tolerance Patient tolerated treatment well   Patient Left in bed;with call bell/phone within reach;with bed alarm set;with nursing/sitter in room   Nurse Communication          Time: 1594-5859 OT Time Calculation (min): 12 min  Charges: OT General Charges $OT Visit: 1 Visit OT Treatments $Therapeutic Activity: 8-22 mins  Jeni Salles, MPH, MS, OTR/L ascom [phone] 03/02/19, 10:58 AM

## 2019-03-02 NOTE — Progress Notes (Signed)
[place] at [place] NAME: Claudia Case    MR#:  [id number]  DATE OF BIRTH:  [birth year]  SUBJECTIVE:   Patient here due to severe stomatitis/pancytopenia secondary to methotrexate toxicity.  The patient has no complaints and tolerated tube feeding so far.  But she is fragile and weak. REVIEW OF SYSTEMS:    Review of Systems  Constitutional: Positive for malaise/fatigue. Negative for chills and fever.  HENT: Negative for congestion and tinnitus.        Mouth sore.  Eyes: Negative for blurred vision and double vision.  Respiratory: Negative for cough, shortness of breath and wheezing.   Cardiovascular: Negative for chest pain, orthopnea and PND.  Gastrointestinal: Negative for abdominal pain, diarrhea, nausea and vomiting.  Genitourinary: Negative for dysuria and hematuria.  Neurological: Positive for weakness (generalized). Negative for dizziness, sensory change and focal weakness.  All other systems reviewed and are negative.  Nutrition: Full Liquid Tolerating Diet: but very little.  DRUG ALLERGIES:  No Known Allergies  VITALS:  Blood pressure (!) 166/71, pulse 90, temperature 99.5 F (37.5 C), temperature source Axillary, resp. rate 20, height 5\' 4"  (1.626 m), weight 59.1 kg, SpO2 97 %.  PHYSICAL EXAMINATION:   Physical Exam  GENERAL:  78 y.o.-year-old thin patient lying in bed in no acute distress.  Looks fragile and weak. EYES: Pupils equal, round, reactive to light and accommodation. No scleral icterus. Extraocular muscles intact.  HEENT: Head atraumatic, normocephalic. Severe Mucositis and dry oral mucosa. dobhoff tube in place.   NECK:  Supple, no jugular venous distention. No thyroid enlargement, no tenderness.  LUNGS: Poor Resp. effort, no wheezing, rales, rhonchi. No use of accessory muscles of respiration.  CARDIOVASCULAR: S1, S2 normal. No murmurs, rubs, or gallops.  ABDOMEN: Soft, nontender, nondistended. Bowel sounds  present. No organomegaly or mass.  EXTREMITIES: No cyanosis, clubbing or edema b/l.    NEUROLOGIC: Cranial nerves II through XII are intact. No focal Motor or sensory deficits b/l. Globally weak.    PSYCHIATRIC: The patient is alert and oriented x 3.  SKIN: No obvious rash, lesion, or ulcer.  LABORATORY PANEL:   CBC Recent Labs  Lab 02/26/19 0529  WBC 8.7  HGB 9.0*  HCT 27.9*  PLT 297   ------------------------------------------------------------------------------------------------------------------  Chemistries  Recent Labs  Lab 02/28/19 0247  03/02/19 0534  NA 140   < > 139  K 4.3   < > 5.4*  CL 114*   < > 115*  CO2 21*   < > 18*  GLUCOSE 168*   < > 196*  BUN 45*   < > 56*  CREATININE 1.19*   < > 1.41*  CALCIUM 7.4*   < > 7.4*  MG 2.4  --   --    < > = values in this interval not displayed.   ------------------------------------------------------------------------------------------------------------------  Cardiac Enzymes No results for input(s): TROPONINI in the last 168 hours. ------------------------------------------------------------------------------------------------------------------  RADIOLOGY:  No results found.   ASSESSMENT AND PLAN:   78 year old female with past medical history of psoriatic arthritis, psoriasis, hypertension hyperlipidemia, GERD who presented to the hospital due to neutropenic fevers.  1.  Neutropenic fever-initially thought to have sepsis but that has been ruled out.  Patient was empirically placed on broad-spectrum IV antibiotics and antifungals which have all been discontinued.  Cultures remain negative.  Neutropenia and fever has resolved.  2.  Pancytopenia-secondary to toxicity from methotrexate. - s/p Leukovorin. improved.   3.  Elevated LFTs/jaundice-much improved and resolved.  Patient's bilirubins and LFTs have normalized.  This was secondary to toxicity from methotrexate.  Ultrasound showed no other acute pathology.  4.   Severe mucositis/thrush- continue nystatin, viscous lidocaine, chlorhexidine. -Seen by ENT and they did not recommend any surgical intervention.  Continue supportive care with meds as mentioned above. -Status post Dobbhoff tube placement and started on tube feeds but patient had some nausea and vomiting and therefore x-ray overnight showing that the Dobbhoff tube was coiled in the hiatal hernia.  Patient had her Dobbhoff tube repositioned.  She tolerated tube feedings so far. - cont. Vitamin supplements with C and thiamine.  Slow to improve and may take 2-3 weeks as per ENT.   5.  Failure to thrive/poor p.o. intake-secondary to severe mucositis/thrush.  - Dobbhoff tube repositioned and resumed tube feedings.  6.  Hypernatremia- improved with D5W.  ARF due to dehydration.  Worsening, continue IV fluid support and follow-up BMP. Hyperkalemia.  Continue IV fluid, start Lokelma,  follow-up potassium.   Discussed plan of care with patient's sister over the phone.  All the records are reviewed and case discussed with Care Management/Social Worker. Management plans discussed with the patient, family and they are in agreement.  CODE STATUS: Full code  DVT Prophylaxis: Ted's & SCD's.   TOTAL TIME TAKING CARE OF THIS PATIENT: 28 minutes.   POSSIBLE D/C IN 3 DAYS, DEPENDING ON CLINICAL CONDITION.  Shaune PollackQing Mozell Haber M.D on 03/02/2019 at 12:19 PM  Between 7am to 6pm - Pager - [phone]  After 6pm go to [email or web address] - Social research officer, governmentpassword EPAS [facility]  [facility] [place] [facility]  Office  [phone]  CC: Primary care physician; Sula RumpleVirk, Charanjit, MD

## 2019-03-02 NOTE — Plan of Care (Signed)
  Problem: Education: Goal: Knowledge of General Education information will improve Description: Including pain rating scale, medication(s)/side effects and non-pharmacologic comfort measures Outcome: Progressing   Problem: Health Behavior/Discharge Planning: Goal: Ability to manage health-related needs will improve Outcome: Progressing   Problem: Clinical Measurements: Goal: Ability to maintain clinical measurements within normal limits will improve Outcome: Progressing Goal: Diagnostic test results will improve Outcome: Progressing   Problem: Safety: Goal: Ability to remain free from injury will improve Outcome: Progressing   Problem: Skin Integrity: Goal: Risk for impaired skin integrity will decrease Outcome: Progressing   

## 2019-03-02 NOTE — Plan of Care (Signed)
  Problem: Education: Goal: Knowledge of General Education information will improve Description: Including pain rating scale, medication(s)/side effects and non-pharmacologic comfort measures Outcome: Progressing   Problem: Clinical Measurements: Goal: Ability to maintain clinical measurements within normal limits will improve Outcome: Progressing Goal: Diagnostic test results will improve Outcome: Progressing   Problem: Safety: Goal: Ability to remain free from injury will improve Outcome: Progressing   Problem: Skin Integrity: Goal: Risk for impaired skin integrity will decrease Outcome: Progressing   

## 2019-03-02 NOTE — Progress Notes (Addendum)
Patient has not voided since 11pm, received verbal order for lasix 20mg  one time from Dr. Sidney Ace.

## 2019-03-03 LAB — GLUCOSE, CAPILLARY
Glucose-Capillary: 142 mg/dL — ABNORMAL HIGH (ref 70–99)
Glucose-Capillary: 143 mg/dL — ABNORMAL HIGH (ref 70–99)
Glucose-Capillary: 160 mg/dL — ABNORMAL HIGH (ref 70–99)
Glucose-Capillary: 160 mg/dL — ABNORMAL HIGH (ref 70–99)
Glucose-Capillary: 161 mg/dL — ABNORMAL HIGH (ref 70–99)

## 2019-03-03 LAB — BASIC METABOLIC PANEL
Anion gap: 3 — ABNORMAL LOW (ref 5–15)
BUN: 60 mg/dL — ABNORMAL HIGH (ref 8–23)
CO2: 18 mmol/L — ABNORMAL LOW (ref 22–32)
Calcium: 7.5 mg/dL — ABNORMAL LOW (ref 8.9–10.3)
Chloride: 120 mmol/L — ABNORMAL HIGH (ref 98–111)
Creatinine, Ser: 1.28 mg/dL — ABNORMAL HIGH (ref 0.44–1.00)
GFR calc Af Amer: 46 mL/min — ABNORMAL LOW (ref >60)
GFR calc non Af Amer: 40 mL/min — ABNORMAL LOW (ref >60)
Glucose, Bld: 187 mg/dL — ABNORMAL HIGH (ref 70–99)
Potassium: 5.9 mmol/L — ABNORMAL HIGH (ref 3.5–5.1)
Sodium: 141 mmol/L (ref 135–145)

## 2019-03-03 LAB — POTASSIUM: Potassium: 6 mmol/L — ABNORMAL HIGH (ref 3.5–5.1)

## 2019-03-03 LAB — MAGNESIUM: Magnesium: 2.2 mg/dL (ref 1.7–2.4)

## 2019-03-03 MED ORDER — NEPRO/CARBSTEADY PO LIQD
1000.0000 mL | ORAL | Status: DC
  Administered 2019-03-03: 1000 mL

## 2019-03-03 MED ORDER — NEPRO/CARBSTEADY PO LIQD
1000.0000 mL | ORAL | Status: DC
  Administered 2019-03-03 – 2019-03-12 (×7): 1000 mL

## 2019-03-03 MED ORDER — DEXTROSE-NACL 5-0.45 % IV SOLN
INTRAVENOUS | Status: DC
  Administered 2019-03-03 – 2019-03-04 (×2): via INTRAVENOUS

## 2019-03-03 MED ORDER — SODIUM ZIRCONIUM CYCLOSILICATE 10 G PO PACK
10.0000 g | PACK | Freq: Two times a day (BID) | ORAL | Status: DC
  Administered 2019-03-03 – 2019-03-07 (×7): 10 g via ORAL
  Filled 2019-03-03 (×12): qty 1

## 2019-03-03 NOTE — Plan of Care (Signed)
  Problem: Education: Goal: Knowledge of General Education information will improve Description: Including pain rating scale, medication(s)/side effects and non-pharmacologic comfort measures Outcome: Progressing   Problem: Health Behavior/Discharge Planning: Goal: Ability to manage health-related needs will improve Outcome: Progressing   Problem: Clinical Measurements: Goal: Ability to maintain clinical measurements within normal limits will improve Outcome: Progressing Goal: Diagnostic test results will improve Outcome: Progressing   Problem: Safety: Goal: Ability to remain free from injury will improve Outcome: Progressing   Problem: Skin Integrity: Goal: Risk for impaired skin integrity will decrease Outcome: Progressing   

## 2019-03-03 NOTE — Progress Notes (Addendum)
Nutrition Follow-up  RD working remotely.  DOCUMENTATION CODES:   Not applicable  INTERVENTION:  Initiate Nepro at 35 mL/hr. Provides 1512 kcal, 68 grams of protein, 21 grams of dietary fiber, 613 mL H2O daily.  Will discontinue Osmolite 1.5 Cal.  Continue minimum free water flush of 30 mL Q4hrs to maintain tube patency.  Recommend measuring daily weights.  NUTRITION DIAGNOSIS:   Inadequate oral intake related to acute illness(thrush, nausea, vomiting) as evidenced by meal completion < 25%.  Ongoing - addressing with TF regimen.  GOAL:   Patient will meet greater than or equal to 90% of their needs  Met with TF regimen.  MONITOR:   PO intake, Supplement acceptance, Labs, Weight trends, Skin, I & O's  REASON FOR ASSESSMENT:   Consult Assessment of nutrition requirement/status  ASSESSMENT:   78 y.o. female with a history of psoriatic arthritis on methotrexate presented to the emergency room on 02/16/2019 with 1 week history of sore throat, difficulty swallowing, blood in urine and rash.  Patient reached goal TF regimen of Osmolite 1.5 Cal at 45 mL/hr on 7/2. She continues to tolerate tube feeds well. Notified by RN that plan is to switch patient to Nepro today in setting of hyperkalemia. Appreciate page from RN. Patient is still on full liquid diet but is not able to tolerate much by mouth still. PO 0% per chart. Also noted patient is on NS at 100 mL/hr and has developed hyperchloremia.  Enteral Access: Dobbhoff tube placed by IR 6/29 and repositioned on 6/30; tip terminates within hiatal hernia portion of stomach  Medications reviewed and include: B-complex with C daily, Dulcolax 10 mg daily RE, folic acid 1 mg daily, free water flush 30 mL Q4hrs, MVI daily, nystatin, Flomax, thiamine 100 mg daily, vitamin C 250 mg BID, NS at 100 mL/hr.  Labs reviewed: CBG 143-161, Potassium 5.9, Chloride 120, CO2 18, BUN 60, Creatinine 1.28, Anion gap 3. Still pending niacin and vitamin  B6 labs.  I/O: UOP unmeasured  No weight to trend since 6/20. Recommend monitoring daily weights on nutrition support.  Diet Order:   Diet Order            Diet full liquid Room service appropriate? Yes with Assist; Fluid consistency: Thin  Diet effective now             EDUCATION NEEDS:   Not appropriate for education at this time  Skin:  Skin Assessment: Reviewed RN Assessment(ecchymosis, rash, Stage I sacrum)  Last BM:  03/01/2019 per chart  Height:   Ht Readings from Last 1 Encounters:  02/16/19 '5\' 4"'  (1.626 m)   Weight:   Wt Readings from Last 1 Encounters:  02/16/19 59.1 kg   Ideal Body Weight:  54.5 kg  BMI:  Body mass index is 22.35 kg/m.  Estimated Nutritional Needs:   Kcal:  1400-1600kcal/day  Protein:  70-80g/day  Fluid:  >1.4L/day  Willey Blade, MS, RD, LDN Office: [phone] Pager: [phone] After Hours/Weekend Pager: [phone]

## 2019-03-03 NOTE — Progress Notes (Signed)
[facility] [place] [facility]     PATIENT NAME: Claudia ShorterLelia Case    MR#:  [id number]  DATE OF BIRTH:  05/31/1941  SUBJECTIVE:   Patient here due to severe stomatitis/pancytopenia secondary to methotrexate toxicity.  The patient has no mouth sore and generalized weakness. REVIEW OF SYSTEMS:    Review of Systems  Constitutional: Positive for malaise/fatigue. Negative for chills and fever.  HENT: Negative for congestion and tinnitus.        Mouth sore.  Eyes: Negative for blurred vision and double vision.  Respiratory: Negative for cough, shortness of breath and wheezing.   Cardiovascular: Negative for chest pain, orthopnea and PND.  Gastrointestinal: Negative for abdominal pain, diarrhea, nausea and vomiting.  Genitourinary: Negative for dysuria and hematuria.  Neurological: Positive for weakness (generalized). Negative for dizziness, sensory change and focal weakness.  All other systems reviewed and are negative.  Nutrition: Full Liquid Tolerating Diet: but very little.  DRUG ALLERGIES:  No Known Allergies  VITALS:  Blood pressure (!) 163/68, pulse 81, temperature 98.4 F (36.9 C), resp. rate (!) 24, height 5\' 4"  (1.626 m), weight 59.1 kg, SpO2 97 %.  PHYSICAL EXAMINATION:   Physical Exam  GENERAL:  78 y.o.-year-old thin patient lying in bed in no acute distress.  Looks fragile and weak. EYES: Pupils equal, round, reactive to light and accommodation. No scleral icterus. Extraocular muscles intact.  HEENT: Head atraumatic, normocephalic. Severe Mucositis and dry oral mucosa. dobhoff tube in place.   NECK:  Supple, no jugular venous distention. No thyroid enlargement, no tenderness.  LUNGS: Poor Resp. effort, no wheezing, rales, rhonchi. No use of accessory muscles of respiration.  CARDIOVASCULAR: S1, S2 normal. No murmurs, rubs, or gallops.  ABDOMEN: Soft, nontender, nondistended. Bowel sounds present. No organomegaly or mass.  EXTREMITIES: No cyanosis,  clubbing or edema b/l.    NEUROLOGIC: Cranial nerves II through XII are intact. No focal Motor or sensory deficits b/l. Globally weak.    PSYCHIATRIC: The patient is alert and oriented x 3.  SKIN: No obvious rash, lesion, or ulcer.  LABORATORY PANEL:   CBC Recent Labs  Lab 02/26/19 0529  WBC 8.7  HGB 9.0*  HCT 27.9*  PLT 297   ------------------------------------------------------------------------------------------------------------------  Chemistries  Recent Labs  Lab 03/03/19 0551  NA 141  K 5.9*  CL 120*  CO2 18*  GLUCOSE 187*  BUN 60*  CREATININE 1.28*  CALCIUM 7.5*  MG 2.2   ------------------------------------------------------------------------------------------------------------------  Cardiac Enzymes No results for input(s): TROPONINI in the last 168 hours. ------------------------------------------------------------------------------------------------------------------  RADIOLOGY:  No results found.   ASSESSMENT AND PLAN:   78 year old female with past medical history of psoriatic arthritis, psoriasis, hypertension hyperlipidemia, GERD who presented to the hospital due to neutropenic fevers.  1.  Neutropenic fever-initially thought to have sepsis but that has been ruled out.  Patient was empirically placed on broad-spectrum IV antibiotics and antifungals which have all been discontinued.  Cultures remain negative.  Neutropenia and fever has resolved.  2.  Pancytopenia-secondary to toxicity from methotrexate. - s/p Leukovorin. improved.   3.  Elevated LFTs/jaundice-much improved and resolved.  Patient's bilirubins and LFTs have normalized.  This was secondary to toxicity from methotrexate.  Ultrasound showed no other acute pathology.  4.  Severe mucositis/thrush- continue nystatin, viscous lidocaine, chlorhexidine. -Seen by ENT and they did not recommend any surgical intervention.  Continue supportive care with meds as mentioned above. -Status post  Dobbhoff tube placement and started on tube feeds but patient had  some nausea and vomiting and therefore x-ray overnight showing that the Dobbhoff tube was coiled in the hiatal hernia.  Patient had her Dobbhoff tube repositioned.  She tolerated tube feedings so far. - cont. Vitamin supplements with C and thiamine.  Slow to improve and may take 2-3 weeks as per ENT.   5.  Failure to thrive/poor p.o. intake-secondary to severe mucositis/thrush.  - Dobbhoff tube repositioned and resumed tube feedings.  6.  Hypernatremia- improved with D5W.  ARF due to dehydration.  Worsening, continue IV fluid support and follow-up BMP. Hyperkalemia.  Continue IV fluid, started Lokelma,  follow-up potassium.  Nephrology consult.  Discussed plan of care with patient's sister over the phone.  All the records are reviewed and Case discussed with Care Management/Social Worker. Management plans discussed with the patient, family and they are in agreement.  CODE STATUS: Full code  DVT Prophylaxis: Ted's & SCD's.   TOTAL TIME TAKING CARE OF THIS PATIENT: 28 minutes.   POSSIBLE D/C IN 3 DAYS, DEPENDING ON CLINICAL CONDITION.  Demetrios Loll M.D on 03/03/2019 at 10:06 AM  Between 7am to 6pm - Pager - [phone]  After 6pm go to [email or web address] - Proofreader  [facility]  Office  [phone]  CC: Primary care physician; Sherrin Daisy, MD

## 2019-03-03 NOTE — Progress Notes (Signed)
PT Cancellation Note  Patient Details Name: Claudia Case MRN: [id number] DOB: 04/18/1941   Cancelled Treatment:    Reason Eval/Treat Not Completed: Patient not medically ready   Chart reviewed.  Potassium 5.9.  Per PT protocols will hold session and continue as appropriate.   Chesley Noon 03/03/2019, 11:36 AM

## 2019-03-03 NOTE — Consult Note (Signed)
[facility] CONSULT NOTE    Date: 03/03/2019                  Patient Name:  Claudia Case  MRN: [id number]  DOB: [birth year]  Age / Sex: 78 y.o., female         PCP: Sula RumpleVirk, Charanjit, MD                 Service Requesting Consult: Hospitalist                 Reason for Consult: Acute renal failure, hyperkalemia            History of Present Illness: Patient is a 78 y.o. female with a PMHx of osteoarthritis, GERD, hyperlipidemia, hypertension, psoriatic arthritis, who was admitted to [facility] on 02/16/2019 for evaluation of severe stomatitis in response to methotrexate.  Patient has history of psoriatic arthritis and was treated with methotrexate.  Thereafter she developed rash as well as severe stomatitis.  She has been started on tube feeds and has nasogastric tube in place.  We are consulted for evaluation management of acute renal failure as well as hyperkalemia.  Serum potassium 5.9.  Patient noted to be on Osmolite for tube feeds.  Patient has been started on sodium zirconium 5 g daily which is a low dose.  Medications: Outpatient medications: Medications Prior to Admission  Medication Sig Dispense Refill Last Dose  . APPLE CIDER VINEGAR PO Take 1 capsule by mouth daily.   Unknown at Unknown  . Calcium Carbonate-Vitamin D 600-200 MG-UNIT TABS Take 1 tablet by mouth daily.   Unknown at Unknown  . cetirizine (ZYRTEC) 10 MG tablet Take 10 mg by mouth daily.   Unknown at Unknown  . ferrous sulfate 325 (65 FE) MG tablet Take 325 mg by mouth daily.   Unknown at Unknown  . folic acid (FOLVITE) 1 MG tablet Take 1 mg by mouth daily.   Unknown at Unknown  . hydrocortisone 2.5 % ointment Apply 1 application topically 2 (two) times daily as needed. Apply to face   PRN at PRN  . hydroxychloroquine (PLAQUENIL) 200 MG tablet Take 200 mg by mouth 2 (two) times a day.   Unknown at Unknown  . lovastatin (MEVACOR) 20 MG tablet Take 20 mg by mouth daily with supper.   Unknown at  Unknown  . magnesium oxide (MAG-OX) 400 MG tablet Take 400 mg by mouth daily.   Unknown at Unknown  . methotrexate (RHEUMATREX) 2.5 MG tablet Take 15 mg by mouth once a week.   Unknown at Unknown  . Multiple Vitamin (MULTIVITAMIN WITH MINERALS) TABS tablet Take 1 tablet by mouth daily.   Unknown at Unknown  . [EXPIRED] nystatin (MYCOSTATIN) 100000 UNIT/ML suspension Take 5 mLs by mouth 4 (four) times daily. For 10 days   Unknown at Unknown  . Omega 3 1000 MG CAPS Take 1 capsule by mouth daily.   Unknown at Unknown  . omeprazole (PRILOSEC) 40 MG capsule Take 40 mg by mouth daily.   Unknown at Unknown  . triamcinolone ointment (KENALOG) 0.1 % Apply 1 application topically as needed.   PRN at PRN    Current medications: Current Facility-Administered Medications  Medication Dose Route Frequency Provider Last Rate Last Dose  . acetaminophen (TYLENOL) tablet 650 mg  650 mg Oral Q6H PRN Alford HighlandWieting, Richard, MD   650 mg at 03/01/19 [id number]   Or  . acetaminophen (TYLENOL) suppository 650 mg  650 mg Rectal Q6H PRN  Alford HighlandWieting, Richard, MD   650 mg at 02/17/19 2045  . acetaminophen (TYLENOL) tablet 650 mg  650 mg Oral Once PRN Jeanmarie PlantMcShane, James A, MD      . B-complex with vitamin C tablet 1 tablet  1 tablet Oral Daily Altamese DillingVachhani, Vaibhavkumar, MD   1 tablet at 03/03/19 1105  . bisacodyl (DULCOLAX) suppository 10 mg  10 mg Rectal Daily Shaune Pollackhen, Qing, MD   10 mg at 03/03/19 1101  . dextrose 5 %-0.45 % sodium chloride infusion   Intravenous Continuous Shaune Pollackhen, Qing, MD      . feeding supplement (NEPRO CARB STEADY) liquid 1,000 mL  1,000 mL Per Tube Q24H Shaune Pollackhen, Qing, MD      . folic acid (FOLVITE) tablet 1 mg  1 mg Oral Daily Altamese DillingVachhani, Vaibhavkumar, MD   1 mg at 03/03/19 1102  . free water 30 mL  30 mL Per Tube Q4H Sainani, Rolly PancakeVivek J, MD   30 mL at 03/03/19 1200  . lidocaine (XYLOCAINE) 2 % viscous mouth solution 15 mL  15 mL Mouth/Throat Q6H PRN Altamese DillingVachhani, Vaibhavkumar, MD   15 mL at 02/24/19 2250  . magic mouthwash  5 mL Oral QID  PRN Altamese DillingVachhani, Vaibhavkumar, MD   5 mL at 02/27/19 1121   And  . lidocaine (XYLOCAINE) 2 % viscous mouth solution 5 mL  5 mL Mouth/Throat QID PRN Altamese DillingVachhani, Vaibhavkumar, MD   5 mL at 03/01/19 0930  . lip balm (BLISTEX) ointment   Topical PRN Shaune Pollackhen, Qing, MD      . miconazole (MICOTIN) 2 % cream   Topical Daily Altamese DillingVachhani, Vaibhavkumar, MD      . multivitamin with minerals tablet 1 tablet  1 tablet Oral Daily Altamese DillingVachhani, Vaibhavkumar, MD   1 tablet at 03/02/19 1308  . nystatin (MYCOSTATIN/NYSTOP) topical powder   Topical BID Altamese DillingVachhani, Vaibhavkumar, MD      . ondansetron [facility](ZOFRAN) tablet 4 mg  4 mg Oral Q6H PRN Alford HighlandWieting, Richard, MD   4 mg at 02/25/19 1115   Or  . ondansetron (ZOFRAN) injection 4 mg  4 mg Intravenous Q6H PRN Alford HighlandWieting, Richard, MD      . [facility](SENOKOT) tablet 8.6 mg  1 tablet Oral Daily PRN Shaune Pollackhen, Qing, MD      . sodium chloride flush (NS) 0.9 % injection 10-40 mL  10-40 mL Intracatheter PRN Kandyce RudKernodle, George W Jr., MD      . sodium zirconium cyclosilicate [facility](LOKELMA) packet 5 g  5 g Oral Daily Shaune Pollackhen, Qing, MD   5 g at 03/03/19 1357  . tamsulosin (FLOMAX) capsule 0.4 mg  0.4 mg Oral Daily Altamese DillingVachhani, Vaibhavkumar, MD   0.4 mg at 03/03/19 1100  . thiamine (VITAMIN B-1) tablet 100 mg  100 mg Oral Daily Altamese DillingVachhani, Vaibhavkumar, MD   100 mg at 03/03/19 1100  . vitamin C (ASCORBIC ACID) tablet 250 mg  250 mg Oral BID Altamese DillingVachhani, Vaibhavkumar, MD   250 mg at 03/03/19 1100      Allergies: No Known Allergies    Past Medical History: Past Medical History:  Diagnosis Date  . Arthritis   . GERD (gastroesophageal reflux disease)   . Hyperlipidemia   . Hypertension   . Psoriatic arthritis [facility](HCC)      Past Surgical History: Past Surgical History:  Procedure Laterality Date  . NO PAST SURGERIES       Family History: Family History  Problem Relation Age of Onset  . Breast cancer Sister 4570  . CAD Mother   . Bone cancer Father  Social History: Social History   Socioeconomic History   . Marital status: Widowed    Spouse name: Not on file  . Number of children: Not on file  . Years of education: Not on file  . Highest education level: Not on file  Occupational History  . Not on file  Social Needs  . Financial resource strain: Not hard at all  . Food insecurity    Worry: Never true    Inability: Never true  . Transportation needs    Medical: No    Non-medical: No  Tobacco Use  . Smoking status: Never Smoker  . Smokeless tobacco: Never Used  Substance and Sexual Activity  . Alcohol use: Not Currently  . Drug use: Never  . Sexual activity: Not Currently  Lifestyle  . Physical activity    Days per week: 2 days    Minutes per session: 20 min  . Stress: Not at all  Relationships  . Social Musician on phone: Once a week    Gets together: Once a week    Attends religious service: 1 to 4 times per year    Active member of club or organization: No    Attends meetings of clubs or organizations: Never    Relationship status: Widowed  . Intimate partner violence    Fear of current or ex partner: No    Emotionally abused: No    Physically abused: No    Forced sexual activity: No  Other Topics Concern  . Not on file  Social History Narrative   Lives alone     Review of Systems: Review of Systems  Constitutional: Positive for malaise/fatigue. Negative for chills and fever.  HENT: Negative for hearing loss and tinnitus.   Eyes: Negative for blurred vision and double vision.  Respiratory: Negative for cough and hemoptysis.   Cardiovascular: Negative for chest pain, palpitations and orthopnea.  Gastrointestinal: Negative for heartburn, nausea and vomiting.  Genitourinary: Negative for dysuria and urgency.  Musculoskeletal: Positive for joint pain. Negative for myalgias.  Skin: Positive for rash.  Neurological: Positive for weakness. Negative for dizziness.  Endo/Heme/Allergies: Negative for polydipsia. Does not bruise/bleed easily.   Psychiatric/Behavioral: Negative for depression. The patient is not nervous/anxious.      Vital Signs: Blood pressure (!) 163/68, pulse 81, temperature 98.4 F (36.9 C), resp. rate (!) 24, height  (1.626 m), weight 59.1 kg, SpO2 97 %.  Weight trends: Filed Weights   02/16/19 1502 02/16/19 2158  Weight: 60.8 kg 59.1 kg    Physical Exam: General: NAD, sitting up in chair  Head: Normocephalic, atraumatic.  Eyes: Anicteric, EOMI  Nose: Mucous membranes moist, NG in place  Throat: Oropharynx nonerythematous, no exudate appreciated.   Neck: Supple, trachea midline.  Lungs:  Normal respiratory effort. Clear to auscultation BL without crackles or wheezes.  Heart: RRR. S1 and S2 normal without gallop, murmur, or rubs.  Abdomen:  BS normoactive. Soft, Nondistended, non-tender.  No masses or organomegaly.  Extremities: No pretibial edema.  Neurologic: A&O X3, Motor strength is 5/5 in the all 4 extremities  Skin: Stomatitis noted, dried blood on both lips    Lab results: Basic Metabolic Panel: Recent Labs  Lab 02/28/19 0247 03/01/19 0247 03/02/19 0534 03/03/19 0551 03/03/19 1358  NA 140 139 139 141  --   K 4.3 4.7 5.4* 5.9* 6.0*  CL 114* 116* 115* 120*  --   CO2 21* 19* 18* 18*  --   GLUCOSE 168*  179* 196* 187*  --   BUN 45* 48* 56* 60*  --   CREATININE 1.19* 1.28* 1.41* 1.28*  --   CALCIUM 7.4* 7.2* 7.4* 7.5*  --   MG 2.4  --   --  2.2  --     Liver Function Tests: No results for input(s): AST, ALT, ALKPHOS, BILITOT, PROT, ALBUMIN in the last 168 hours. No results for input(s): LIPASE, AMYLASE in the last 168 hours. No results for input(s): AMMONIA in the last 168 hours.  CBC: Recent Labs  Lab 02/26/19 0529  WBC 8.7  HGB 9.0*  HCT 27.9*  MCV 106.5*  PLT 297    Cardiac Enzymes: No results for input(s): CKTOTAL, CKMB, CKMBINDEX, TROPONINI in the last 168 hours.  BNP: Invalid input(s): POCBNP  CBG: Recent Labs  Lab 03/02/19 1603 03/02/19 2003  03/03/19 0003 03/03/19 0738 03/03/19 1226  GLUCAP 147* 149* 143* 161* 160*    Microbiology: Results for orders placed or performed during the hospital encounter of 02/16/19  Culture, blood (Routine x 2)     Status: None   Collection Time: 02/16/19  3:29 PM   Specimen: BLOOD  Result Value Ref Range Status   Specimen Description BLOOD LEFT ANTECUBITAL  Final   Special Requests   Final    BOTTLES DRAWN AEROBIC AND ANAEROBIC Blood Culture results may not be optimal due to an excessive volume of blood received in culture bottles   Culture   Final    NO GROWTH 5 DAYS Performed at [facility], [address] [place], [place] [address]    Report Status 02/25/2019 FINAL  Final  Culture, blood (Routine x 2)     Status: Abnormal   Collection Time: 02/16/19  4:40 PM   Specimen: BLOOD  Result Value Ref Range Status   Specimen Description   Final    BLOOD RIGHT ANTECUBITAL Performed at [facility], [place] [address], [place], [address] [address]    Special Requests   Final    BOTTLES DRAWN AEROBIC AND ANAEROBIC Blood Culture adequate volume Performed at [facility], [address] [place], [place] [address]    Culture  Setup Time   Final    GRAM POSITIVE COCCI ANAEROBIC BOTTLE ONLY CRITICAL RESULT CALLED TO, READ BACK BY AND VERIFIED WITH: Rayna Sexton ON 02/17/2019 AT 1051 QSD    Culture (A)  Final    STAPHYLOCOCCUS SPECIES (COAGULASE NEGATIVE) THE SIGNIFICANCE OF ISOLATING THIS ORGANISM FROM A SINGLE SET OF BLOOD CULTURES WHEN MULTIPLE SETS ARE DRAWN IS UNCERTAIN. PLEASE NOTIFY THE MICROBIOLOGY DEPARTMENT WITHIN ONE WEEK IF SPECIATION AND SENSITIVITIES ARE REQUIRED. Performed at [facility], [place] [address] [place], [place] [address]    Report Status 02/19/2019 FINAL  Final  Urine culture     Status: None   Collection Time: 02/16/19  4:40 PM   Specimen: Urine, Random  Result Value Ref Range Status   Specimen Description   Final    URINE,  RANDOM Performed at [facility], [address] [place], [place] [address]    Special Requests   Final    NONE Performed at [facility], [address], [place], [place] [address]    Culture   Final    NO GROWTH Performed at [facility], [place] [address], [place],  [address]    Report Status 02/18/2019 FINAL  Final  Blood Culture ID Panel (Reflexed)     Status: Abnormal   Collection  Time: 02/16/19  4:40 PM  Result Value Ref Range Status   Enterococcus species NOT DETECTED NOT DETECTED Final   Listeria monocytogenes NOT DETECTED NOT DETECTED Final   Staphylococcus species DETECTED (A) NOT DETECTED Final    Comment: Methicillin (oxacillin) susceptible coagulase negative staphylococcus. Possible blood culture contaminant (unless isolated from more than one blood culture draw or clinical case suggests pathogenicity). No antibiotic treatment is indicated for blood  culture contaminants. CRITICAL RESULT CALLED TO, READ BACK BY AND VERIFIED WITH: Crist FatHANNAH WANG ON 02/17/2019 AT 1051 QSD    Staphylococcus aureus (BCID) NOT DETECTED NOT DETECTED Final   Methicillin resistance NOT DETECTED NOT DETECTED Final   Streptococcus species NOT DETECTED NOT DETECTED Final   Streptococcus agalactiae NOT DETECTED NOT DETECTED Final   Streptococcus pneumoniae NOT DETECTED NOT DETECTED Final   Streptococcus pyogenes NOT DETECTED NOT DETECTED Final   Acinetobacter baumannii NOT DETECTED NOT DETECTED Final   Enterobacteriaceae species NOT DETECTED NOT DETECTED Final   Enterobacter cloacae complex NOT DETECTED NOT DETECTED Final   Escherichia coli NOT DETECTED NOT DETECTED Final   Klebsiella oxytoca NOT DETECTED NOT DETECTED Final   Klebsiella pneumoniae NOT DETECTED NOT DETECTED Final   Proteus species NOT DETECTED NOT DETECTED Final   Serratia marcescens NOT DETECTED NOT DETECTED Final   Haemophilus influenzae NOT DETECTED NOT DETECTED Final   Neisseria meningitidis NOT  DETECTED NOT DETECTED Final   Pseudomonas aeruginosa NOT DETECTED NOT DETECTED Final   Candida albicans NOT DETECTED NOT DETECTED Final   Candida glabrata NOT DETECTED NOT DETECTED Final   Candida krusei NOT DETECTED NOT DETECTED Final   Candida parapsilosis NOT DETECTED NOT DETECTED Final   Candida tropicalis NOT DETECTED NOT DETECTED Final    Comment: Performed at [facility], [address] [place], [place] [id number]  SARS Coronavirus 2 (CEPHEID- Performed in [facility]), Hosp Order     Status: None   Collection Time: 02/16/19  4:42 PM   Specimen: Nasopharyngeal Swab  Result Value Ref Range Status   SARS Coronavirus 2 NEGATIVE NEGATIVE Final    Comment: (NOTE) If result is NEGATIVE SARS-CoV-2 target nucleic acids are NOT DETECTED. The SARS-CoV-2 RNA is generally detectable in upper and lower  respiratory specimens during the acute phase of infection. The lowest  concentration of SARS-CoV-2 viral copies this assay can detect is 250  copies / mL. A negative result does not preclude SARS-CoV-2 infection  and should not be used as the sole basis for treatment or other  patient management decisions.  A negative result may occur with  improper specimen collection / handling, submission of specimen other  than nasopharyngeal swab, presence of viral mutation(s) within the  areas targeted by this assay, and inadequate number of viral copies  (<250 copies / mL). A negative result must be combined with clinical  observations, patient history, and epidemiological information. If result is POSITIVE SARS-CoV-2 target nucleic acids are DETECTED. The SARS-CoV-2 RNA is generally detectable in upper and lower  respiratory specimens dur ing the acute phase of infection.  Positive  results are indicative of active infection with SARS-CoV-2.  Clinical  correlation with patient history and other diagnostic information is  necessary to determine patient infection status.   Positive results do  not rule out bacterial infection or co-infection with other viruses. If result is PRESUMPTIVE POSTIVE SARS-CoV-2 nucleic acids MAY BE PRESENT.   A presumptive positive result was obtained on the submitted specimen  and confirmed on repeat testing.  While 2019 novel coronavirus  (SARS-CoV-2) nucleic acids may be present in the submitted sample  additional confirmatory testing may be necessary for epidemiological  and / or clinical management purposes  to differentiate between  SARS-CoV-2 and other Sarbecovirus currently known to infect humans.  If clinically indicated additional testing with an alternate test  methodology [phone]) is advised. The SARS-CoV-2 RNA is generally  detectable in upper and lower respiratory sp ecimens during the acute  phase of infection. The expected result is Negative. Fact Sheet for Patients:  BoilerBrush.com.cy Fact Sheet for Healthcare Providers: https://pope.com/ This test is not yet approved or cleared by the [place] FDA and has been authorized for detection and/or diagnosis of SARS-CoV-2 by FDA under an Emergency Use Authorization (EUA).  This EUA will remain in effect (meaning this test can be used) for the duration of the COVID-19 declaration under Section 564(b)(1) of the Act, 21 U.S.C. section 360bbb-3(b)(1), unless the authorization is terminated or revoked sooner. Performed at [facility], [address] [place], [place] [address]   Culture, blood (single) w Reflex to ID Panel     Status: None   Collection Time: 02/18/19 12:14 PM   Specimen: BLOOD  Result Value Ref Range Status   Specimen Description BLOOD BLOOD LEFT ARM  Final   Special Requests   Final    BOTTLES DRAWN AEROBIC AND ANAEROBIC Blood Culture results may not be optimal due to an excessive volume of blood received in culture bottles   Culture   Final    NO GROWTH 5 DAYS Performed at [facility], [address], [place], [place] [address]    Report Status 02/23/2019 FINAL  Final    Coagulation Studies: No results for input(s): LABPROT, INR in the last 72 hours.  Urinalysis: No results for input(s): COLORURINE, LABSPEC, PHURINE, GLUCOSEU, HGBUR, BILIRUBINUR, KETONESUR, PROTEINUR, UROBILINOGEN, NITRITE, LEUKOCYTESUR in the last 72 hours.  Invalid input(s): APPERANCEUR    Imaging:  No results found.   Assessment & Plan: Pt is a 78 y.o. female with a PMHx of osteoarthritis, GERD, hyperlipidemia, hypertension, psoriatic arthritis, who was admitted to [facility] on 02/16/2019 for evaluation of severe stomatitis in response to methotrexate.  1.  Acute renal failure, possibly due to high protein content tube feeds with possible dehydration. 2.  Hyperkalemia serum potassium up to 6.0. 3.  Metabolic acidosis likely due to tube feeds and acute renal failure. 4.  Drug reaction with severe stomatitis secondary to methotrexate.  Plan: Patient originally admitted with severe stomatitis secondary to methotrexate.  She has an NG tube in place.  Patient was on Osmolite 1.5.  We have discontinued this in favor of Nepro.  Patient is noted to be on Lokelma 5 g daily.  We will increase this to 10 g twice daily for now.  Continue IV fluid hydration.  No urgent indication for dialysis at the moment.  Continue to monitor serum potassium daily.  Further plan as patient progresses.

## 2019-03-04 ENCOUNTER — Inpatient Hospital Stay: Payer: Medicare HMO

## 2019-03-04 LAB — GLUCOSE, CAPILLARY
Glucose-Capillary: 140 mg/dL — ABNORMAL HIGH (ref 70–99)
Glucose-Capillary: 158 mg/dL — ABNORMAL HIGH (ref 70–99)
Glucose-Capillary: 129 mg/dL — ABNORMAL HIGH (ref 70–99)
Glucose-Capillary: 141 mg/dL — ABNORMAL HIGH (ref 70–99)
Glucose-Capillary: 138 mg/dL — ABNORMAL HIGH (ref 70–99)
Glucose-Capillary: 135 mg/dL — ABNORMAL HIGH (ref 70–99)
Glucose-Capillary: 138 mg/dL — ABNORMAL HIGH (ref 70–99)

## 2019-03-04 LAB — BASIC METABOLIC PANEL
Anion gap: 5 (ref 5–15)
Anion gap: 6 (ref 5–15)
BUN: 62 mg/dL — ABNORMAL HIGH (ref 8–23)
BUN: 63 mg/dL — ABNORMAL HIGH (ref 8–23)
CO2: 15 mmol/L — ABNORMAL LOW (ref 22–32)
CO2: 19 mmol/L — ABNORMAL LOW (ref 22–32)
Calcium: 7.7 mg/dL — ABNORMAL LOW (ref 8.9–10.3)
Calcium: 8.2 mg/dL — ABNORMAL LOW (ref 8.9–10.3)
Chloride: 119 mmol/L — ABNORMAL HIGH (ref 98–111)
Chloride: 118 mmol/L — ABNORMAL HIGH (ref 98–111)
Creatinine, Ser: 1.27 mg/dL — ABNORMAL HIGH (ref 0.44–1.00)
Creatinine, Ser: 1.38 mg/dL — ABNORMAL HIGH (ref 0.44–1.00)
GFR calc Af Amer: 47 mL/min — ABNORMAL LOW (ref >60)
GFR calc Af Amer: 42 mL/min — ABNORMAL LOW (ref >60)
GFR calc non Af Amer: 40 mL/min — ABNORMAL LOW (ref >60)
GFR calc non Af Amer: 37 mL/min — ABNORMAL LOW (ref >60)
Glucose, Bld: 173 mg/dL — ABNORMAL HIGH (ref 70–99)
Glucose, Bld: 135 mg/dL — ABNORMAL HIGH (ref 70–99)
Potassium: 6.1 mmol/L — ABNORMAL HIGH (ref 3.5–5.1)
Potassium: 5.1 mmol/L (ref 3.5–5.1)
Sodium: 139 mmol/L (ref 135–145)
Sodium: 143 mmol/L (ref 135–145)

## 2019-03-04 LAB — CBC
HCT: 25 % — ABNORMAL LOW (ref 36.0–46.0)
Hemoglobin: 7.9 g/dL — ABNORMAL LOW (ref 12.0–15.0)
MCH: 35.3 pg — ABNORMAL HIGH (ref 26.0–34.0)
MCHC: 31.6 g/dL (ref 30.0–36.0)
MCV: 111.6 fL — ABNORMAL HIGH (ref 80.0–100.0)
Platelets: 308 10*3/uL (ref 150–400)
RBC: 2.24 MIL/uL — ABNORMAL LOW (ref 3.87–5.11)
RDW: 16 % — ABNORMAL HIGH (ref 11.5–15.5)
WBC: 11.8 10*3/uL — ABNORMAL HIGH (ref 4.0–10.5)
nRBC: 0 % (ref 0.0–0.2)

## 2019-03-04 MED ORDER — CALCIUM GLUCONATE-NACL 1-0.675 GM/50ML-% IV SOLN
1.0000 g | Freq: Once | INTRAVENOUS | Status: AC
  Administered 2019-03-04: 1000 mg via INTRAVENOUS
  Filled 2019-03-04: qty 50

## 2019-03-04 MED ORDER — IPRATROPIUM-ALBUTEROL 0.5-2.5 (3) MG/3ML IN SOLN
3.0000 mL | Freq: Once | RESPIRATORY_TRACT | Status: AC
  Administered 2019-03-04: 3 mL via RESPIRATORY_TRACT
  Filled 2019-03-04: qty 3

## 2019-03-04 MED ORDER — DEXTROSE 50 % IV SOLN
1.0000 | Freq: Once | INTRAVENOUS | Status: AC
  Administered 2019-03-04: 06:00:00 50 mL via INTRAVENOUS
  Filled 2019-03-04: qty 50

## 2019-03-04 MED ORDER — FUROSEMIDE 20 MG PO TABS
20.0000 mg | ORAL_TABLET | Freq: Once | ORAL | Status: DC
  Filled 2019-03-04: qty 1

## 2019-03-04 MED ORDER — INSULIN REGULAR HUMAN 100 UNIT/ML IJ SOLN
10.0000 [IU] | Freq: Once | INTRAMUSCULAR | Status: AC
  Administered 2019-03-04: 10 [IU] via INTRAVENOUS
  Filled 2019-03-04: qty 10

## 2019-03-04 MED ORDER — CIPROFLOXACIN HCL 0.3 % OP SOLN
1.0000 [drp] | OPHTHALMIC | Status: DC
  Administered 2019-03-04 – 2019-03-14 (×48): 1 [drp] via OPHTHALMIC
  Filled 2019-03-04 (×2): qty 2.5

## 2019-03-04 MED ORDER — FUROSEMIDE 10 MG/ML IJ SOLN
40.0000 mg | Freq: Once | INTRAMUSCULAR | Status: AC
  Administered 2019-03-04: 08:00:00 40 mg via INTRAVENOUS
  Filled 2019-03-04: qty 4

## 2019-03-04 MED ORDER — SODIUM BICARBONATE 8.4 % IV SOLN
50.0000 meq | Freq: Once | INTRAVENOUS | Status: AC
  Administered 2019-03-04: 50 meq via INTRAVENOUS
  Filled 2019-03-04: qty 50

## 2019-03-04 MED ORDER — FUROSEMIDE 10 MG/ML IJ SOLN
20.0000 mg | Freq: Once | INTRAMUSCULAR | Status: AC
  Administered 2019-03-04: 20 mg via INTRAVENOUS
  Filled 2019-03-04: qty 2

## 2019-03-04 MED ORDER — ERYTHROMYCIN 5 MG/GM OP OINT
TOPICAL_OINTMENT | Freq: Two times a day (BID) | OPHTHALMIC | Status: DC
  Administered 2019-03-04: 1 via OPHTHALMIC
  Filled 2019-03-04: qty 1

## 2019-03-04 NOTE — Progress Notes (Signed)
Patient's potassium 6.1, respirations at 25BPM, some wheezing, 02 sats 95% on RA, received verbal order from Dr. Sidney Ace for sodium bicarb 1 amp, 10 units of regular insulin, D50 amp, 20mg  lasix IV, and one time dose of duoneb. See vital signs on flowsheet.

## 2019-03-04 NOTE — Progress Notes (Signed)
[facility]  ROUNDING NOTE   Subjective:   Patient went into respiratory failure. IV fluids stopped. Given furosemide 20mg  IV and is feeling better.   Objective:  Vital signs in last 24 hours:  Temp:  [98.2 F (36.8 C)] 98.2 F (36.8 C) (07/06 1512) Pulse Rate:  [79-86] 86 (07/06 1512) Resp:  [20-27] 20 (07/06 1512) BP: (138-149)/(54-69) 138/69 (07/06 1512) SpO2:  [92 %-95 %] 95 % (07/06 1512)  Weight change:  Filed Weights   02/16/19 1502 02/16/19 2158  Weight: 60.8 kg 59.1 kg    Intake/Output: No intake/output data recorded.   Intake/Output this shift:  No intake/output data recorded.  Physical Exam: General: NAD,   Head: +stomatitis  Eyes: Anicteric, PERRL  Neck: Supple, trachea midline  Lungs:  Bilateral crackles  Heart: Regular rate and rhythm  Abdomen:  Soft, nontender,   Extremities: no peripheral edema.  Neurologic: Nonfocal, moving all four extremities  Skin: No lesions       Basic Metabolic Panel: Recent Labs  Lab 02/28/19 0247 03/01/19 0247 03/02/19 0534 03/03/19 0551 03/03/19 1358 03/04/19 0415 03/04/19 0817  NA 140 139 139 141  --  139 143  K 4.3 4.7 5.4* 5.9* 6.0* 6.1* 5.1  CL 114* 116* 115* 120*  --  119* 118*  CO2 21* 19* 18* 18*  --  15* 19*  GLUCOSE 168* 179* 196* 187*  --  173* 135*  BUN 45* 48* 56* 60*  --  62* 63*  CREATININE 1.19* 1.28* 1.41* 1.28*  --  1.27* 1.38*  CALCIUM 7.4* 7.2* 7.4* 7.5*  --  7.7* 8.2*  MG 2.4  --   --  2.2  --   --   --     Liver Function Tests: No results for input(s): AST, ALT, ALKPHOS, BILITOT, PROT, ALBUMIN in the last 168 hours. No results for input(s): LIPASE, AMYLASE in the last 168 hours. No results for input(s): AMMONIA in the last 168 hours.  CBC: Recent Labs  Lab 02/26/19 0529 03/04/19 0817  WBC 8.7 11.8*  HGB 9.0* 7.9*  HCT 27.9* 25.0*  MCV 106.5* 111.6*  PLT 297 308    Cardiac Enzymes: No results for input(s): CKTOTAL, CKMB, CKMBINDEX, TROPONINI in the last 168  hours.  BNP: Invalid input(s): POCBNP  CBG: Recent Labs  Lab 03/03/19 1658 03/03/19 2230 03/04/19 0100 03/04/19 0735 03/04/19 1144  GLUCAP 142* 160* 140* 158* 129*    Microbiology: Results for orders placed or performed during the hospital encounter of 02/16/19  Culture, blood (Routine x 2)     Status: None   Collection Time: 02/16/19  3:29 PM   Specimen: BLOOD  Result Value Ref Range Status   Specimen Description BLOOD LEFT ANTECUBITAL  Final   Special Requests   Final    BOTTLES DRAWN AEROBIC AND ANAEROBIC Blood Culture results may not be optimal due to an excessive volume of blood received in culture bottles   Culture   Final    NO GROWTH 5 DAYS Performed at [facility], [address] [place], [place] [address]    Report Status 02/25/2019 FINAL  Final  Culture, blood (Routine x 2)     Status: Abnormal   Collection Time: 02/16/19  4:40 PM   Specimen: BLOOD  Result Value Ref Range Status   Specimen Description   Final    BLOOD RIGHT ANTECUBITAL Performed at [facility], [address] [place], [place] [address]    Special Requests  Final    BOTTLES DRAWN AEROBIC AND ANAEROBIC Blood Culture adequate volume Performed at [facility], [address] [place], [place] [address]    Culture  Setup Time   Final    GRAM POSITIVE COCCI ANAEROBIC BOTTLE ONLY CRITICAL RESULT CALLED TO, READ BACK BY AND VERIFIED WITH: Crist FatHANNAH WANG ON 02/17/2019 AT 1051 QSD    Culture (A)  Final    STAPHYLOCOCCUS SPECIES (COAGULASE NEGATIVE) THE SIGNIFICANCE OF ISOLATING THIS ORGANISM FROM A SINGLE SET OF BLOOD CULTURES WHEN MULTIPLE SETS ARE DRAWN IS UNCERTAIN. PLEASE NOTIFY THE MICROBIOLOGY DEPARTMENT WITHIN ONE WEEK IF SPECIATION AND SENSITIVITIES ARE REQUIRED. Performed at [facility], [address] [place], [place] [address]    Report Status 02/19/2019 FINAL  Final  Urine culture     Status: None   Collection Time: 02/16/19  4:40 PM   Specimen:  Urine, Random  Result Value Ref Range Status   Specimen Description   Final    URINE, RANDOM Performed at [facility], [address] [place], [place] [address]    Special Requests   Final    NONE Performed at [facility], [address] [place], [place] [address]    Culture   Final    NO GROWTH Performed at [facility], [address] [place], [place] [address]    Report Status 02/18/2019 FINAL  Final  Blood Culture ID Panel (Reflexed)     Status: Abnormal   Collection Time: 02/16/19  4:40 PM  Result Value Ref Range Status   Enterococcus species NOT DETECTED NOT DETECTED Final   Listeria monocytogenes NOT DETECTED NOT DETECTED Final   Staphylococcus species DETECTED (A) NOT DETECTED Final    Comment: Methicillin (oxacillin) susceptible coagulase negative staphylococcus. Possible blood culture contaminant (unless isolated from more than one blood culture draw or clinical case suggests pathogenicity). No antibiotic treatment is indicated for blood  culture contaminants. CRITICAL RESULT CALLED TO, READ BACK BY AND VERIFIED WITH: Crist FatHANNAH WANG ON 02/17/2019 AT 1051 QSD    Staphylococcus aureus (BCID) NOT DETECTED NOT DETECTED Final   Methicillin resistance NOT DETECTED NOT DETECTED Final   Streptococcus species NOT DETECTED NOT DETECTED Final   Streptococcus agalactiae NOT DETECTED NOT DETECTED Final   Streptococcus pneumoniae NOT DETECTED NOT DETECTED Final   Streptococcus pyogenes NOT DETECTED NOT DETECTED Final   Acinetobacter baumannii NOT DETECTED NOT DETECTED Final   Enterobacteriaceae species NOT DETECTED NOT DETECTED Final   Enterobacter cloacae complex NOT DETECTED NOT DETECTED Final   Escherichia coli NOT DETECTED NOT DETECTED Final   Klebsiella oxytoca NOT DETECTED NOT DETECTED Final   Klebsiella pneumoniae NOT DETECTED NOT DETECTED Final   Proteus species NOT DETECTED NOT DETECTED Final   Serratia marcescens NOT DETECTED NOT DETECTED Final    Haemophilus influenzae NOT DETECTED NOT DETECTED Final   Neisseria meningitidis NOT DETECTED NOT DETECTED Final   Pseudomonas aeruginosa NOT DETECTED NOT DETECTED Final   Candida albicans NOT DETECTED NOT DETECTED Final   Candida glabrata NOT DETECTED NOT DETECTED Final   Candida krusei NOT DETECTED NOT DETECTED Final   Candida parapsilosis NOT DETECTED NOT DETECTED Final   Candida tropicalis NOT DETECTED NOT DETECTED Final    Comment: Performed at [facility], [address] [place], [place] [address]  SARS Coronavirus 2 (CEPHEID- Performed in [facility]), Hosp Order     Status: None   Collection Time: 02/16/19  4:42 PM   Specimen: Nasopharyngeal Swab  Result Value Ref Range Status   SARS Coronavirus 2 NEGATIVE NEGATIVE Final    Comment: (NOTE) If result is NEGATIVE SARS-CoV-2 target nucleic acids are NOT DETECTED. The SARS-CoV-2 RNA is generally detectable in upper and lower  respiratory specimens during the acute phase of infection. The lowest  concentration of SARS-CoV-2 viral copies this assay can detect is 250  copies / mL. A negative result does not preclude SARS-CoV-2 infection  and should not be used as the sole basis for treatment or other  patient management decisions.  A negative result may occur with  improper specimen collection / handling, submission of specimen other  than nasopharyngeal swab, presence of viral mutation(s) within the  areas targeted by this assay, and inadequate number of viral copies  (<250 copies / mL). A negative result must be combined with clinical  observations, patient history, and epidemiological information. If result is POSITIVE SARS-CoV-2 target nucleic acids are DETECTED. The SARS-CoV-2 RNA is generally detectable in upper and lower  respiratory specimens dur ing the acute phase of infection.  Positive  results are indicative of active infection with SARS-CoV-2.  Clinical  correlation with patient history and  other diagnostic information is  necessary to determine patient infection status.  Positive results do  not rule out bacterial infection or co-infection with other viruses. If result is PRESUMPTIVE POSTIVE SARS-CoV-2 nucleic acids MAY BE PRESENT.   A presumptive positive result was obtained on the submitted specimen  and confirmed on repeat testing.  While 2019 novel coronavirus  (SARS-CoV-2) nucleic acids may be present in the submitted sample  additional confirmatory testing may be necessary for epidemiological  and / or clinical management purposes  to differentiate between  SARS-CoV-2 and other Sarbecovirus currently known to infect humans.  If clinically indicated additional testing with an alternate test  methodology [phone]([id number]) is advised. The SARS-CoV-2 RNA is generally  detectable in upper and lower respiratory sp ecimens during the acute  phase of infection. The expected result is Negative. Fact Sheet for Patients:  BoilerBrush.com.cyhttps://www.fda.gov/media/136312/download Fact Sheet for Healthcare Providers: https://pope.com/https://www.fda.gov/media/136313/download This test is not yet approved or cleared by the [place] FDA and has been authorized for detection and/or diagnosis of SARS-CoV-2 by FDA under an Emergency Use Authorization (EUA).  This EUA will remain in effect (meaning this test can be used) for the duration of the COVID-19 declaration under Section 564(b)(1) of the Act, 21 U.S.C. section 360bbb-3(b)(1), unless the authorization is terminated or revoked sooner. Performed at [facility], [address] [place], [place] [address]   Culture, blood (single) w Reflex to ID Panel     Status: None   Collection Time: 02/18/19 12:14 PM   Specimen: BLOOD  Result Value Ref Range Status   Specimen Description BLOOD BLOOD LEFT ARM  Final   Special Requests   Final    BOTTLES DRAWN AEROBIC AND ANAEROBIC Blood Culture results may not be optimal due to an excessive volume of blood  received in culture bottles   Culture   Final    NO GROWTH 5 DAYS Performed at [facility], [address] [place], [place] [address]    Report Status 02/23/2019 FINAL  Final    Coagulation Studies: No results for input(s): LABPROT, INR in the last 72 hours.  Urinalysis: No results for input(s): COLORURINE, LABSPEC, PHURINE, GLUCOSEU, HGBUR, BILIRUBINUR, KETONESUR, PROTEINUR, UROBILINOGEN, NITRITE, LEUKOCYTESUR in the last 72 hours.  Invalid input(s): APPERANCEUR    Imaging: Dg Chest 1 View  Addendum Date:  03/04/2019   ADDENDUM REPORT: 03/04/2019 09:16 ADDENDUM: The above report should read Dobbhoff tube noted with tip coiled in a prominent hiatal hernia. Electronically Signed   By: Marcello Moores  Register   On: 03/04/2019 09:16   Result Date: 03/04/2019 CLINICAL DATA:  Wheezing.  Tachypnea. EXAM: CHEST  1 VIEW COMPARISON:  02/16/2019. FINDINGS: Dobbhoff tube noted with tip coiled in the esophagus. Repositioning suggested. Heart size stable. Diffuse bilateral pulmonary infiltrates/edema. Small bilateral pleural effusions. IMPRESSION: 1. Dobbhoff tube noted with tip coiled in the esophagus. Repositioning suggested. 2. Diffuse bilateral pulmonary infiltrates/edema. Small bilateral pleural effusions. Critical Value/emergent results were called by telephone at the time of interpretation on 03/04/2019 at 7:28 am to nurse Danielle who verbally acknowledged these results. Electronically Signed: ByMarcello Moores  Register On: 03/04/2019 07:31     Medications:    . B-complex with vitamin C  1 tablet Oral Daily  . bisacodyl  10 mg Rectal Daily  . ciprofloxacin  1 drop Both Eyes Q4H while awake  . feeding supplement (NEPRO CARB STEADY)  1,000 mL Per Tube Q24H  . folic acid  1 mg Oral Daily  . free water  30 mL Per Tube Q4H  . miconazole   Topical Daily  . multivitamin with minerals  1 tablet Oral Daily  . nystatin   Topical BID  . sodium zirconium cyclosilicate  10 g Oral BID  . tamsulosin   0.4 mg Oral Daily  . thiamine  100 mg Oral Daily  . vitamin C  250 mg Oral BID   acetaminophen **OR** acetaminophen, acetaminophen, lidocaine, magic mouthwash **AND** lidocaine, lip balm, ondansetron **OR** ondansetron (ZOFRAN) IV, senna, sodium chloride flush  Assessment/ Plan:  Ms. Claudia Case is a 78 y.o. white female with osteoarthritis, GERD, hyperlipidemia, hypertension, psoriatic arthritis, who was admitted to [facility] on 02/16/2019 for evaluation of severe stomatitis in response to methotrexate.  1.  Acute renal failure, possibly due to high protein content tube feeds with dehydration. 2.  Hyperkalemia  3.  Metabolic acidosis  4.  Drug reaction with severe stomatitis secondary to methotrexate.  Plan: Fluid overload - fluids stopped and furosemide given.  Monitor volume status.  Continue sodium zirconium   LOS: 16 Esma Kilts 7/6/20204:04 PM

## 2019-03-04 NOTE — Progress Notes (Signed)
Patient ID: Claudia Case, female   DOB: 07-05-1941, 78 y.o.   MRN: [id number]  [employer] PROGRESS NOTE  Claudia FillLelia Ann Dipasquale NWG:[id number] DOB: 07-05-1941 DOA: 02/16/2019 PCP: Sula RumpleVirk, Charanjit, MD  HPI/Subjective: Called by nursing staff for tachypnea.  The patient has been getting IV fluids for acute kidney injury and dehydration.  She has not been able to eat and has a Dobbhoff tube in.  Patient does not complain of shortness of breath.  Pulse ox is 95% on room air.  Objective: Vitals:   03/03/19 2233 03/04/19 0600  BP: (!) 148/62 (!) 149/58  Pulse: 84 79  Resp: (!) 22   Temp: 98.2 F (36.8 C)   SpO2: 95% 94%   No intake or output data in the 24 hours ending 03/04/19 0802 Filed Weights   02/16/19 1502 02/16/19 2158  Weight: 60.8 kg 59.1 kg    ROS: Review of Systems  Unable to perform ROS: Acuity of condition  Respiratory: Negative for shortness of breath.   Cardiovascular: Negative for chest pain.  Gastrointestinal: Negative for abdominal pain.   Exam: Physical Exam  Constitutional: She appears lethargic.  HENT:  Nose: No mucosal edema.  Brownish discoloration on tongue and around mouth.  Eyes: Pupils are equal, round, and reactive to light. Conjunctivae are normal. Right eye exhibits discharge. Left eye exhibits discharge.  Neck: JVD present. Carotid bruit is not present. No edema present. No thyroid mass and no thyromegaly present.  Cardiovascular: S1 normal and S2 normal. Exam reveals no gallop.  No murmur heard. Pulses:      Dorsalis pedis pulses are 2+ on the right side and 2+ on the left side.  Respiratory: No respiratory distress. She has decreased breath sounds in the right lower field and the left lower field. She has no wheezes. She has no rhonchi. She has rales in the right lower field and the left lower field.  GI: Soft. Bowel sounds are normal. There is no abdominal tenderness.  Musculoskeletal:     Right wrist: She exhibits swelling.     Left wrist:  She exhibits swelling.     Right ankle: She exhibits swelling.     Left ankle: She exhibits swelling.  Lymphadenopathy:    She has no cervical adenopathy.  Neurological: She appears lethargic.  Skin: Skin is warm. No rash noted. Nails show no clubbing.  Psychiatric: Her affect is blunt.      Data Reviewed: Basic Metabolic Panel: Recent Labs  Lab 02/28/19 0247 03/01/19 0247 03/02/19 0534 03/03/19 0551 03/03/19 1358 03/04/19 0415  NA 140 139 139 141  --  139  K 4.3 4.7 5.4* 5.9* 6.0* 6.1*  CL 114* 116* 115* 120*  --  119*  CO2 21* 19* 18* 18*  --  15*  GLUCOSE 168* 179* 196* 187*  --  173*  BUN 45* 48* 56* 60*  --  62*  CREATININE 1.19* 1.28* 1.41* 1.28*  --  1.27*  CALCIUM 7.4* 7.2* 7.4* 7.5*  --  7.7*  MG 2.4  --   --  2.2  --   --    Liver Function Tests: No results for input(s): AST, ALT, ALKPHOS, BILITOT, PROT, ALBUMIN in the last 168 hours. No results for input(s): LIPASE, AMYLASE in the last 168 hours. No results for input(s): AMMONIA in the last 168 hours. CBC: Recent Labs  Lab 02/26/19 0529  WBC 8.7  HGB 9.0*  HCT 27.9*  MCV 106.5*  PLT 297    CBG: Recent Labs  Lab 03/03/19 1226 03/03/19 1658 03/03/19 2230 03/04/19 0100 03/04/19 0735  GLUCAP 160* 142* 160* 140* 158*      Studies: Dg Chest 1 View  Result Date: 03/04/2019 CLINICAL DATA:  Wheezing.  Tachypnea. EXAM: CHEST  1 VIEW COMPARISON:  02/16/2019. FINDINGS: Dobbhoff tube noted with tip coiled in the esophagus. Repositioning suggested. Heart size stable. Diffuse bilateral pulmonary infiltrates/edema. Small bilateral pleural effusions. IMPRESSION: 1. Dobbhoff tube noted with tip coiled in the esophagus. Repositioning suggested. 2. Diffuse bilateral pulmonary infiltrates/edema. Small bilateral pleural effusions. Critical Value/emergent results were called by telephone at the time of interpretation on 03/04/2019 at 7:28 am to nurse Danielle who verbally acknowledged these results. Electronically  Signed   By: Marcello Moores  Register   On: 03/04/2019 07:31    Scheduled Meds: . B-complex with vitamin C  1 tablet Oral Daily  . bisacodyl  10 mg Rectal Daily  . ciprofloxacin  1 drop Both Eyes Q4H while awake  . feeding supplement (NEPRO CARB STEADY)  1,000 mL Per Tube Q24H  . folic acid  1 mg Oral Daily  . free water  30 mL Per Tube Q4H  . furosemide  40 mg Intravenous Once  . miconazole   Topical Daily  . multivitamin with minerals  1 tablet Oral Daily  . nystatin   Topical BID  . sodium zirconium cyclosilicate  10 g Oral BID  . tamsulosin  0.4 mg Oral Daily  . thiamine  100 mg Oral Daily  . vitamin C  250 mg Oral BID   Continuous Infusions:  Assessment/Plan:   1. Acute pulmonary edema and fluid overload.  Discontinue IV fluids.  Give another 40 of IV Lasix.  Continue to watch respiratory status closely.  Reassess after getting rid of excess fluid.  May end up needing a VQ scan. 2. Severe mucositis and thrush.  Continue nystatin Viscous Lidocaine and chlorhexidine. 3. Failure to thrive and severe malnutrition.  X-ray showing Dobbhoff tube needs to be repositioned.  Spoke with nursing staff to make this happen today.  Off tube feedings on hold. 4. Acute kidney injury and dehydration.  With fluid overload will have to hold medications.  BMP currently pending. 5. Hyperkalemia.  Patient started on Lokelma but too thick to go through the Dobbhoff tube. 6. Neutropenic fever on admission secondary to methotrexate.  Finished broad-spectrum antibiotics. 7. Pancytopenia secondary to methotrexate toxicity.  Status post leucovorin and platelet transfusion.  All blood counts have improved. 8. Elevated liver function tests and jaundice.  This also has improved. 9. Hypernatremia.  This has improved. 10. Conjunctivitis start Ciloxan eyedrops  Code Status:     Code Status Orders  (From admission, onward)         Start     Ordered   02/16/19 2000  Full code  Continuous     02/16/19 2000         Code Status History    This patient has a current code status but no historical code status.   Advance Care Planning Activity    Advance Directive Documentation     Most Recent Value  Type of Advance Directive  Living will  Pre-existing out of facility DNR order (yellow form or pink MOST form)  -  "MOST" Form in Place?  -     Family Communication: Tried to reach the patient's sister on her home phone and cell phone without any answer. Disposition Plan: To be determined  Consultants:  Nephrology  Rheumatology  Oncology  ENT  Time spent: 28 minutes now  [employer] Marq Rebello  [employer]

## 2019-03-04 NOTE — Progress Notes (Signed)
Received verbal order from Dr. Brett Albino for chest x ray, wheezing increasing. 02 sats 95% on RA.

## 2019-03-04 NOTE — Progress Notes (Signed)
Received verbal order from Gardiner Barefoot NP for erythromycin twice daily for eye drainage.

## 2019-03-04 NOTE — Care Management Important Message (Signed)
Important Message  Patient Details  Name: Kymber Kosar MRN: [id number] Date of Birth: [birth year]   Medicare Important Message Given:  Yes     Juliann Pulse A Keny Donald 03/04/2019, 12:33 PM

## 2019-03-04 NOTE — Progress Notes (Signed)
PT Cancellation Note  Patient Details Name: Claudia Case MRN: [id number] DOB: 11-Jan-1941   Cancelled Treatment:    Reason Eval/Treat Not Completed: Other (comment). Treatment attempted, however pt very lethargic with increased work of breathing. O2 sats at 91% on RA. Keeps eyes closed unless asked to open. Discussed with RN prior to treatment, however pt unable to participate in therapy. Will re-attempt next available date.   Prajwal Fellner 03/04/2019, 10:03 AM  Greggory Stallion, PT, DPT [phone]

## 2019-03-05 ENCOUNTER — Inpatient Hospital Stay: Payer: Medicare HMO

## 2019-03-05 ENCOUNTER — Other Ambulatory Visit: Payer: Medicare HMO

## 2019-03-05 ENCOUNTER — Encounter: Payer: Self-pay | Admitting: Primary Care

## 2019-03-05 DIAGNOSIS — K121 Other forms of stomatitis: Secondary | ICD-10-CM

## 2019-03-05 DIAGNOSIS — Z515 Encounter for palliative care: Secondary | ICD-10-CM

## 2019-03-05 DIAGNOSIS — Z7189 Other specified counseling: Secondary | ICD-10-CM

## 2019-03-05 DIAGNOSIS — R627 Adult failure to thrive: Secondary | ICD-10-CM

## 2019-03-05 LAB — CBC
HCT: 23.1 % — ABNORMAL LOW (ref 36.0–46.0)
Hemoglobin: 7.1 g/dL — ABNORMAL LOW (ref 12.0–15.0)
MCH: 34.6 pg — ABNORMAL HIGH (ref 26.0–34.0)
MCHC: 30.7 g/dL (ref 30.0–36.0)
MCV: 112.7 fL — ABNORMAL HIGH (ref 80.0–100.0)
Platelets: 272 10*3/uL (ref 150–400)
RBC: 2.05 MIL/uL — ABNORMAL LOW (ref 3.87–5.11)
RDW: 15.9 % — ABNORMAL HIGH (ref 11.5–15.5)
WBC: 10.8 10*3/uL — ABNORMAL HIGH (ref 4.0–10.5)
nRBC: 0 % (ref 0.0–0.2)

## 2019-03-05 LAB — GLUCOSE, CAPILLARY
Glucose-Capillary: 115 mg/dL — ABNORMAL HIGH (ref 70–99)
Glucose-Capillary: 115 mg/dL — ABNORMAL HIGH (ref 70–99)
Glucose-Capillary: 116 mg/dL — ABNORMAL HIGH (ref 70–99)
Glucose-Capillary: 119 mg/dL — ABNORMAL HIGH (ref 70–99)
Glucose-Capillary: 120 mg/dL — ABNORMAL HIGH (ref 70–99)
Glucose-Capillary: 122 mg/dL — ABNORMAL HIGH (ref 70–99)
Glucose-Capillary: 127 mg/dL — ABNORMAL HIGH (ref 70–99)
Glucose-Capillary: 130 mg/dL — ABNORMAL HIGH (ref 70–99)
Glucose-Capillary: 97 mg/dL (ref 70–99)

## 2019-03-05 LAB — BASIC METABOLIC PANEL
Anion gap: 5 (ref 5–15)
BUN: 65 mg/dL — ABNORMAL HIGH (ref 8–23)
CO2: 19 mmol/L — ABNORMAL LOW (ref 22–32)
Calcium: 8.2 mg/dL — ABNORMAL LOW (ref 8.9–10.3)
Chloride: 118 mmol/L — ABNORMAL HIGH (ref 98–111)
Creatinine, Ser: 1.39 mg/dL — ABNORMAL HIGH (ref 0.44–1.00)
GFR calc Af Amer: 42 mL/min — ABNORMAL LOW (ref >60)
GFR calc non Af Amer: 36 mL/min — ABNORMAL LOW (ref >60)
Glucose, Bld: 130 mg/dL — ABNORMAL HIGH (ref 70–99)
Potassium: 5.2 mmol/L — ABNORMAL HIGH (ref 3.5–5.1)
Sodium: 142 mmol/L (ref 135–145)

## 2019-03-05 LAB — BLOOD GAS, ARTERIAL
Acid-base deficit: 3.2 mmol/L — ABNORMAL HIGH (ref 0.0–2.0)
Bicarbonate: 19.2 mmol/L — ABNORMAL LOW (ref 20.0–28.0)
FIO2: 0.32
O2 Saturation: 95.8 %
Patient temperature: 37
pCO2 arterial: 27 mmHg — ABNORMAL LOW (ref 32.0–48.0)
pH, Arterial: 7.46 — ABNORMAL HIGH (ref 7.350–7.450)
pO2, Arterial: 76 mmHg — ABNORMAL LOW (ref 83.0–108.0)

## 2019-03-05 MED ORDER — PIPERACILLIN-TAZOBACTAM 3.375 G IVPB
3.3750 g | Freq: Two times a day (BID) | INTRAVENOUS | Status: DC
  Administered 2019-03-05 – 2019-03-06 (×4): 3.375 g via INTRAVENOUS
  Filled 2019-03-05 (×5): qty 50

## 2019-03-05 MED ORDER — PANTOPRAZOLE SODIUM 40 MG IV SOLR
40.0000 mg | Freq: Two times a day (BID) | INTRAVENOUS | Status: DC
  Administered 2019-03-05 – 2019-03-14 (×19): 40 mg via INTRAVENOUS
  Filled 2019-03-05 (×18): qty 40

## 2019-03-05 MED ORDER — IOPAMIDOL (ISOVUE-300) INJECTION 61%
45.0000 mL | Freq: Once | INTRAVENOUS | Status: AC | PRN
  Administered 2019-03-05: 45 mL

## 2019-03-05 MED ORDER — FUROSEMIDE 10 MG/ML IJ SOLN
40.0000 mg | Freq: Once | INTRAMUSCULAR | Status: AC
  Administered 2019-03-05: 08:00:00 40 mg via INTRAVENOUS
  Filled 2019-03-05: qty 4

## 2019-03-05 MED ORDER — IPRATROPIUM-ALBUTEROL 0.5-2.5 (3) MG/3ML IN SOLN
3.0000 mL | RESPIRATORY_TRACT | Status: AC
  Administered 2019-03-05: 01:00:00 3 mL via RESPIRATORY_TRACT
  Filled 2019-03-05: qty 3

## 2019-03-05 NOTE — Progress Notes (Signed)
Patient ID: Claudia Case, female   DOB: 07/30/1941, 78 y.o.   MRN: [id number]  [facility] PROGRESS NOTE  Claudia FillLelia Ann Hinz WUJ:[id number] DOB: 07/30/1941 DOA: 02/16/2019 PCP: Sula RumpleVirk, Charanjit, MD  HPI/Subjective: Patient awakened from sleep.  Answers a few questions and then goes back to sleep.  Does not complain of shortness of breath.  Objective: Vitals:   03/05/19 0042 03/05/19 0619  BP:  (!) 157/74  Pulse:  90  Resp:  19  Temp:  98.8 F (37.1 C)  SpO2: 97% 90%    Intake/Output Summary (Last 24 hours) at 03/05/2019 0758 Last data filed at 03/04/2019 2300 Gross per 24 hour  Intake 2066.08 ml  Output -  Net 2066.08 ml   Filed Weights   02/16/19 1502 02/16/19 2158  Weight: 60.8 kg 59.1 kg    ROS: Review of Systems  Unable to perform ROS: Acuity of condition  Respiratory: Negative for shortness of breath.   Cardiovascular: Negative for chest pain.  Gastrointestinal: Negative for abdominal pain.   Exam: Physical Exam  Constitutional: She appears lethargic.  HENT:  Nose: No mucosal edema.  Blackish discoloration on tongue and around mouth.  Eyes: Pupils are equal, round, and reactive to light. Conjunctivae are normal. Right eye exhibits discharge. Left eye exhibits discharge.  Neck: JVD present. Carotid bruit is not present. No edema present. No thyroid mass and no thyromegaly present.  Cardiovascular: S1 normal and S2 normal. Exam reveals no gallop.  No murmur heard. Pulses:      Dorsalis pedis pulses are 2+ on the right side and 2+ on the left side.  Respiratory: No respiratory distress. She has decreased breath sounds in the right lower field and the left lower field. She has no wheezes. She has rhonchi in the right lower field and the left lower field. She has no rales.  GI: Soft. Bowel sounds are normal. There is no abdominal tenderness.  Musculoskeletal:     Right wrist: She exhibits swelling.     Left wrist: She exhibits swelling.     Right ankle: She  exhibits swelling.     Left ankle: She exhibits swelling.  Lymphadenopathy:    She has no cervical adenopathy.  Neurological: She appears lethargic.  Skin: Skin is warm. No rash noted. Nails show no clubbing.  Psychiatric: Her affect is blunt.      Data Reviewed: Basic Metabolic Panel: Recent Labs  Lab 02/28/19 0247  03/02/19 0534 03/03/19 0551 03/03/19 1358 03/04/19 0415 03/04/19 0817 03/05/19 0342  NA 140   < > 139 141  --  139 143 142  K 4.3   < > 5.4* 5.9* 6.0* 6.1* 5.1 5.2*  CL 114*   < > 115* 120*  --  119* 118* 118*  CO2 21*   < > 18* 18*  --  15* 19* 19*  GLUCOSE 168*   < > 196* 187*  --  173* 135* 130*  BUN 45*   < > 56* 60*  --  62* 63* 65*  CREATININE 1.19*   < > 1.41* 1.28*  --  1.27* 1.38* 1.39*  CALCIUM 7.4*   < > 7.4* 7.5*  --  7.7* 8.2* 8.2*  MG 2.4  --   --  2.2  --   --   --   --    < > = values in this interval not displayed.   CBC: Recent Labs  Lab 03/04/19 0817 03/05/19 0342  WBC 11.8* 10.8*  HGB 7.9* 7.1*  HCT 25.0* 23.1*  MCV 111.6* 112.7*  PLT 308 272    CBG: Recent Labs  Lab 03/05/19 0121 03/05/19 0228 03/05/19 0334 03/05/19 0645 03/05/19 0733  GLUCAP 120* 115* 122* 119* 130*      Studies: Dg Chest 1 View  Result Date: 03/04/2019 CLINICAL DATA:  NG tube placement. EXAM: CHEST  1 VIEW COMPARISON:  March 04, 2019 FINDINGS: Again noted are multifocal airspace opacities with some progression since prior study. The NG tube remains kinked within a large hiatal hernia. Overall the positioning is essentially unchanged from prior study. IMPRESSION: 1. No significant interval change in NG tube positioning. 2. Persistent multifocal airspace opacities. Electronically Signed   By: Katherine Mantlehristopher  Green M.D.   On: 03/04/2019 23:13   Dg Chest 1 View  Addendum Date: 03/04/2019   ADDENDUM REPORT: 03/04/2019 09:16 ADDENDUM: The above report should read Dobbhoff tube noted with tip coiled in a prominent hiatal hernia. Electronically Signed   By: Maisie Fushomas   Register   On: 03/04/2019 09:16   Result Date: 03/04/2019 CLINICAL DATA:  Wheezing.  Tachypnea. EXAM: CHEST  1 VIEW COMPARISON:  02/16/2019. FINDINGS: Dobbhoff tube noted with tip coiled in the esophagus. Repositioning suggested. Heart size stable. Diffuse bilateral pulmonary infiltrates/edema. Small bilateral pleural effusions. IMPRESSION: 1. Dobbhoff tube noted with tip coiled in the esophagus. Repositioning suggested. 2. Diffuse bilateral pulmonary infiltrates/edema. Small bilateral pleural effusions. Critical Value/emergent results were called by telephone at the time of interpretation on 03/04/2019 at 7:28 am to nurse Danielle who verbally acknowledged these results. Electronically Signed: ByMaisie Fus: Thomas  Register On: 03/04/2019 07:31    Scheduled Meds: . B-complex with vitamin C  1 tablet Oral Daily  . bisacodyl  10 mg Rectal Daily  . ciprofloxacin  1 drop Both Eyes Q4H while awake  . feeding supplement (NEPRO CARB STEADY)  1,000 mL Per Tube Q24H  . folic acid  1 mg Oral Daily  . free water  30 mL Per Tube Q4H  . furosemide  40 mg Intravenous Once  . miconazole   Topical Daily  . multivitamin with minerals  1 tablet Oral Daily  . nystatin   Topical BID  . pantoprazole (PROTONIX) IV  40 mg Intravenous Q12H  . sodium zirconium cyclosilicate  10 g Oral BID  . tamsulosin  0.4 mg Oral Daily  . thiamine  100 mg Oral Daily  . vitamin C  250 mg Oral BID   Continuous Infusions:  Assessment/Plan:   1. Tachypnea with acute pulmonary edema and fluid overload.  We will start empiric Zosyn just in case pneumonia.  VQ scan ordered last night.  We will get an ultrasound of the lower extremities.  Give another dose of Lasix this morning. 2. Severe mucositis and thrush.  Continue nystatin Viscous Lidocaine and chlorhexidine. 3. Failure to thrive and severe malnutrition.  I did speak with interventional radiology yesterday about the tube being in a hiatal hernia and it being okay to use.  This morning the  nurse was unable to flush the tube and will need unclogging or replacement.  Nursing staff to speak with interventional radiology about timing of this. 4. Acute kidney injury and dehydration.  With fluid overload will have to hold medications.  Continue to watch creatinine with diuresis. 5. Hyperkalemia.  Better after Lasix 6. Neutropenic fever on admission secondary to methotrexate.  Finished broad-spectrum antibiotics. 7. Pancytopenia secondary to methotrexate toxicity.  Status post leucovorin and platelet transfusion.  All blood counts have improved. 8. Elevated liver function  tests and jaundice.  This also has improved. 9. Hypernatremia.  This has improved. 10. Conjunctivitis start Ciloxan eyedrops  Code Status:     Code Status Orders  (From admission, onward)         Start     Ordered   02/16/19 2000  Full code  Continuous     02/16/19 2000        Code Status History    This patient has a current code status but no historical code status.   Advance Care Planning Activity    Advance Directive Documentation     Most Recent Value  Type of Advance Directive  Living will  Pre-existing out of facility DNR order (yellow form or pink MOST form)  -  "MOST" Form in Place?  -     Family Communication: Spoke with sister on the phone yesterday afternoon and tried to reach this am Disposition Plan: To be determined  Consultants:  Nephrology  Rheumatology  Oncology  ENT  Time spent: 29 minutes now  [employer]

## 2019-03-05 NOTE — Progress Notes (Signed)
OT Cancellation Note  Patient Details Name: Claudia Case MRN: [id number] DOB: [birth year]   Cancelled Treatment:    Reason Eval/Treat Not Completed: Medical issues which prohibited therapy(Pt.'s Potassium id 5.2, Hgb 7.1, HCT 23.1. Will continues to moitor, and intervene at a later time, or date when medically appropriate.)  Harrel Carina, MS, OTR/L 03/05/2019, 1:50 PM

## 2019-03-05 NOTE — Consult Note (Signed)
Pharmacy Antibiotic Note  Claudia Case is a 78 y.o. female admitted on 02/16/2019 with possible aspiration pneumonia.  Pharmacy has been consulted for Zosyn dosing.  Plan: Zosyn 3.375g IV q12h (4 hour infusion).  Height: 5\' 4"  (162.6 cm) Weight: (unable to obtain) IBW/kg (Calculated) : 54.7  Temp (24hrs), Avg:98.5 F (36.9 C), Min:98.2 F (36.8 C), Max:98.8 F (37.1 C)  Recent Labs  Lab 03/02/19 0534 03/03/19 0551 03/04/19 0415 03/04/19 0817 03/05/19 0342  WBC  --   --   --  11.8* 10.8*  CREATININE 1.41* 1.28* 1.27* 1.38* 1.39*    Estimated Creatinine Clearance: 28.8 mL/min (A) (by C-G formula based on SCr of 1.39 mg/dL (H)).    No Known Allergies  Antimicrobials this admission: Zosyn 7/7 >>     Thank you for allowing pharmacy to be a part of this patient's care.  Vanden Fawaz A Freedom Lopezperez 03/05/2019 8:24 AM

## 2019-03-05 NOTE — Progress Notes (Signed)
Made Dr. Leslye Peer and Caryl Pina, RN aware that dobhoff is coiled in patient's esophagus per radiology.  Clarise Cruz, BSN

## 2019-03-05 NOTE — Progress Notes (Addendum)
Prior to giving pm meds, said nurse noted that notes from am shift stated the chest xray showed a coiled Dobhoff in the esophageal/hernia area.  Said nurse did not find documentation that it was corrected, so before I proceeded, I spoke with Claudia Barefoot, NP who ordered another chest xray to verify Dobhoff placement.  Claudia Case had noted in am dictation that he wanted tube feeds paused until Dobhoff placement was corrected.   Pt found to be breathing hard at 2300 when xray came in to verify Dobhoff placement.  Said nurse immediately stopped tube feeds that were noted to be still  running.   O2 sats were 88% on room air.  Started 3L O2 and sats came up to 93%. [name] ordered a STAT ABG, VQ scan and nebulizer treatment at this time.

## 2019-03-05 NOTE — Consult Note (Signed)
Consultation Note Date: 03/05/2019   Patient Name: Claudia Case  DOB: 08-23-1941  MRN: [id number]  Age / Sex: 78 y.o., female  PCP: Sherrin Daisy, MD Referring Physician: Loletha Grayer, MD  Reason for Consultation: Establishing goals of care  HPI/Patient Profile: 78 y.o. female  with past medical history of rheumatoid arthritis on methotrexate, GERD, HTN/HLD admitted on 02/16/2019 with neutropenic fever, pancytopenia likely from methotrexate, failure to thrive.   Clinical Assessment and Goals of Care: Claudia Case is seen in x-ray department.  She appears acutely ill and frail.  Radiology is attempting to place temporary feeding tube.  Claudia Case is open to continuing attempts at this point, we plan for follow-up meeting tomorrow.  Call to sister, Claudia Case at [phone].  Daughter states that she lives next door to "Claudia Case".  She shares that Claudia Case's husband died over 10 years ago, and she never had children.  Claudia Case tells me that Claudia Case is independent with ADLs, manages her household and her money.  Claudia Case does not drive, therefore Claudia Case takes her grocery shopping and such.  Claudia Case shares that she and her husband would take and out for meals most every time they went, she is very close to her sister.    Claudia Case shares that she is concerned that their sisters are calling so frequently, bothering nursing staff.  She tells me that she placed a password in order to reduce the number of calls from family members.  Claudia Case shares that she is not trying to keep anything from her family, but instead working to Administrator, sports.   Claudia Case shares that there were 8 children in the family, 3 brothers have passed away.  There are 5 remaining sisters, and only she is close with Claudia Case. door shares that the other sisters only see Claudia Case a few times per year.  Claudia Case and I talk briefly about code status, life support.  She shares that she feels  Claudia Case would not want life support but she has never talked about such choices with Claudia Case. 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  She worked in Special educational needs teacher until retirement, and is very active in the yard.  PMT to discuss code status with patient tomorrow.   Conference with bedside nursing staff related to patient needs, family dynamics.   PMT to continue to follow.   HCPOA    NEXT OF KIN - Unsure if HCPOA, no kids, husband passed 10+ years.  Claudia Case lives beside her, gets groceries. "Looks after her", Claudia Case doesn't drive, she depends on me for driving. IADLs.  Was 45, 3 brothers passed, now 5 sisters.    SUMMARY OF RECOMMENDATIONS   Further discussion related to goals of care needed.  24-48 hours for outcomes.  Code Status/Advance Care Planning:  Full code -unable to discuss Claudia Case with patient today.  PMT to hold CODE STATUS discussions with patient and family.  Symptom Management:   Per hospitalist, no additional needs at this time.  Palliative Prophylaxis:  Frequent Pain Assessment and Turn Reposition  Additional Recommendations (Limitations, Scope, Preferences):  Full Scope Treatment  Psycho-social/Spiritual:   Desire for further Chaplaincy support:no  Additional Recommendations: Caregiving  Support/Resources and Education on Hospice  Prognosis:   Unable to determine, based on outcomes.  Discharge Planning: To be determined, based on outcomes.      Primary Diagnoses: Present on Admission: . Neutropenic fever (HCC)   I have reviewed the medical record, interviewed the patient and family, and examined the patient. The following aspects are pertinent.  Past Medical History:  Diagnosis Date  . Arthritis   . GERD (gastroesophageal reflux disease)   . Hyperlipidemia   . Hypertension   . Psoriatic arthritis (HCC)    Social History   Socioeconomic History  . Marital status: Widowed    Spouse name: Not on file  . Number of children:  Not on file  . Years of education: Not on file  . Highest education level: Not on file  Occupational History  . Not on file  Social Needs  . Financial resource strain: Not hard at all  . Food insecurity    Worry: Never true    Inability: Never true  . Transportation needs    Medical: No    Non-medical: No  Tobacco Use  . Smoking status: Never Smoker  . Smokeless tobacco: Never Used  Substance and Sexual Activity  . Alcohol use: Not Currently  . Drug use: Never  . Sexual activity: Not Currently  Lifestyle  . Physical activity    Days per week: 2 days    Minutes per session: 20 min  . Stress: Not at all  Relationships  . Social Musicianconnections    Talks on phone: Once a week    Gets together: Once a week    Attends religious service: 1 to 4 times per year    Active member of club or organization: No    Attends meetings of clubs or organizations: Never    Relationship status: Widowed  Other Topics Concern  . Not on file  Social History Narrative   Lives alone   Family History  Problem Relation Age of Onset  . Breast cancer Sister 6670  . CAD Mother   . Bone cancer Father    Scheduled Meds: . B-complex with vitamin C  1 tablet Oral Daily  . bisacodyl  10 mg Rectal Daily  . ciprofloxacin  1 drop Both Eyes Q4H while awake  . feeding supplement (NEPRO CARB STEADY)  1,000 mL Per Tube Q24H  . folic acid  1 mg Oral Daily  . free water  30 mL Per Tube Q4H  . miconazole   Topical Daily  . multivitamin with minerals  1 tablet Oral Daily  . nystatin   Topical BID  . pantoprazole (PROTONIX) IV  40 mg Intravenous Q12H  . sodium zirconium cyclosilicate  10 g Oral BID  . tamsulosin  0.4 mg Oral Daily  . thiamine  100 mg Oral Daily  . vitamin C  250 mg Oral BID   Continuous Infusions: . piperacillin-tazobactam (ZOSYN)  IV 3.375 g (03/05/19 1123)   PRN Meds:.acetaminophen **OR** acetaminophen, acetaminophen, lidocaine, magic mouthwash **AND** lidocaine, lip balm, ondansetron  **OR** ondansetron (ZOFRAN) IV, senna, sodium chloride flush Medications Prior to Admission:  Prior to Admission medications   Medication Sig Start Date End Date Taking? Authorizing Provider  APPLE CIDER VINEGAR PO Take 1 capsule by mouth daily.   Yes [provider]  Calcium Carbonate-Vitamin D  600-200 MG-UNIT TABS Take 1 tablet by mouth daily.   Yes [provider]  cetirizine (ZYRTEC) 10 MG tablet Take 10 mg by mouth daily. 02/11/19  Yes [provider]  ferrous sulfate 325 (65 FE) MG tablet Take 325 mg by mouth daily.   Yes [provider]  folic acid (FOLVITE) 1 MG tablet Take 1 mg by mouth daily. 01/28/19 01/28/20 Yes [provider]  hydrocortisone 2.5 % ointment Apply 1 application topically 2 (two) times daily as needed. Apply to face 10/10/18  Yes [provider]  hydroxychloroquine (PLAQUENIL) 200 MG tablet Take 200 mg by mouth 2 (two) times a day. 02/13/19  Yes [provider]  lovastatin (MEVACOR) 20 MG tablet Take 20 mg by mouth daily with supper. 02/11/19  Yes [provider]  magnesium oxide (MAG-OX) 400 MG tablet Take 400 mg by mouth daily.   Yes [provider]  methotrexate (RHEUMATREX) 2.5 MG tablet Take 15 mg by mouth once a week. 01/28/19  Yes [provider]  Multiple Vitamin (MULTIVITAMIN WITH MINERALS) TABS tablet Take 1 tablet by mouth daily.   Yes [provider]  Omega 3 1000 MG CAPS Take 1 capsule by mouth daily. 07/31/18  Yes [provider]  omeprazole (PRILOSEC) 40 MG capsule Take 40 mg by mouth daily. 02/11/19  Yes [provider]  triamcinolone ointment (KENALOG) 0.1 % Apply 1 application topically as needed. 10/11/18  Yes [provider]   No Known Allergies Review of Systems  Unable to perform ROS: Acuity of condition    Physical Exam Vitals signs and nursing note reviewed.  Constitutional:      General: She is not in acute distress.     Appearance: She is normal weight. She is ill-appearing.  HENT:     Mouth/Throat:     Mouth: Mucous membranes are dry.     Comments: Dried blood around mouth  Cardiovascular:     Rate and Rhythm: Normal rate.  Pulmonary:     Effort: Pulmonary effort is normal. No respiratory distress.  Abdominal:     General: Abdomen is flat. There is no distension.  Skin:    General: Skin is warm and dry.  Neurological:     Mental Status: She is alert and oriented to person, place, and time.  Psychiatric:     Comments: Calm and cooperative     Vital Signs: BP (!) 157/74 (BP Location: Left Arm)   Pulse 90   Temp 98.8 F (37.1 C)   Resp 19   Ht 5\' 4"  (1.626 m)   Wt 59.1 kg   SpO2 90%   BMI 22.35 kg/m  Pain Scale: 0-10 POSS *See Group Information*: 1-Acceptable,Awake and alert Pain Score: 0-No pain   SpO2: SpO2: 90 % O2 Device:SpO2: 90 % O2 Flow Rate: .O2 Flow Rate (L/min): 3 L/min  IO: Intake/output summary:   Intake/Output Summary (Last 24 hours) at 03/05/2019 1453 Last data filed at 03/05/2019 0900 Gross per 24 hour  Intake 2066.08 ml  Output -  Net 2066.08 ml    LBM: Last BM Date: 03/04/19 Baseline Weight: Weight: 60.8 kg Most recent weight: Weight: (unable to obtain)     Palliative Assessment/Data:   Flowsheet Rows     Most Recent Value  Intake Tab  Referral Department  Hospitalist  Unit at Time of Referral  Med/Surg Unit  Palliative Care Primary Diagnosis  Cancer  Date Notified  03/04/19  Palliative Care Type  New Palliative care  Reason for referral  Clarify Goals of Care  Date of Admission  02/16/19  Date first seen by Palliative Care  03/05/19  # of days Palliative referral response time  1 Day(s)  # of days IP prior to Palliative referral  16  Clinical Assessment  Palliative Performance Scale Score  30%  Pain Max last 24 hours  Not able to report  Pain Min Last 24 hours  Not able to report  Dyspnea Max Last 24 Hours  Not able to report  Dyspnea Min Last 24  hours  Not able to report  Psychosocial & Spiritual Assessment  Palliative Care Outcomes      Time In: 1500 Time Out: 1610 Time Total: 70 minutes  Greater than 50%  of this time was spent counseling and coordinating care related to the above assessment and plan.  Signed by: Katheran Aweasha A Dove, NP   Please contact Palliative Medicine Team phone at [phone] for questions and concerns.  For individual provider: See Loretha StaplerAmion

## 2019-03-05 NOTE — Progress Notes (Signed)
PT Cancellation Note  Patient Details Name: Claudia Case MRN: [id number] DOB: [birth year]   Cancelled Treatment:    Reason Eval/Treat Not Completed: Medical issues which prohibited therapy   Chart reviewed.  Potassium 5.2, HgB 7.1 and HCT 23.1  Will hold therapy session per PT protocols and continue as appropriate.   Chesley Noon 03/05/2019, 8:34 AM

## 2019-03-05 NOTE — Progress Notes (Signed)
Patient ID: Claudia Case, female   DOB: 10/30/1940, 78 y.o.   MRN: [id number] vq scan discontinued because it would not be a good test with fluid overload/ pneumonia, unable to lie flat and NGT. Dr Leslye Peer

## 2019-03-06 LAB — CBC
HCT: 22.9 % — ABNORMAL LOW (ref 36.0–46.0)
Hemoglobin: 7.1 g/dL — ABNORMAL LOW (ref 12.0–15.0)
MCH: 34.5 pg — ABNORMAL HIGH (ref 26.0–34.0)
MCHC: 31 g/dL (ref 30.0–36.0)
MCV: 111.2 fL — ABNORMAL HIGH (ref 80.0–100.0)
Platelets: 290 10*3/uL (ref 150–400)
RBC: 2.06 MIL/uL — ABNORMAL LOW (ref 3.87–5.11)
RDW: 15.5 % (ref 11.5–15.5)
WBC: 11.6 10*3/uL — ABNORMAL HIGH (ref 4.0–10.5)
nRBC: 0 % (ref 0.0–0.2)

## 2019-03-06 LAB — BASIC METABOLIC PANEL
Anion gap: 4 — ABNORMAL LOW (ref 5–15)
BUN: 71 mg/dL — ABNORMAL HIGH (ref 8–23)
CO2: 20 mmol/L — ABNORMAL LOW (ref 22–32)
Calcium: 7.9 mg/dL — ABNORMAL LOW (ref 8.9–10.3)
Chloride: 118 mmol/L — ABNORMAL HIGH (ref 98–111)
Creatinine, Ser: 1.6 mg/dL — ABNORMAL HIGH (ref 0.44–1.00)
GFR calc Af Amer: 35 mL/min — ABNORMAL LOW (ref >60)
GFR calc non Af Amer: 31 mL/min — ABNORMAL LOW (ref >60)
Glucose, Bld: 162 mg/dL — ABNORMAL HIGH (ref 70–99)
Potassium: 4.8 mmol/L (ref 3.5–5.1)
Sodium: 142 mmol/L (ref 135–145)

## 2019-03-06 LAB — GLUCOSE, CAPILLARY
Glucose-Capillary: 130 mg/dL — ABNORMAL HIGH (ref 70–99)
Glucose-Capillary: 146 mg/dL — ABNORMAL HIGH (ref 70–99)
Glucose-Capillary: 146 mg/dL — ABNORMAL HIGH (ref 70–99)
Glucose-Capillary: 149 mg/dL — ABNORMAL HIGH (ref 70–99)
Glucose-Capillary: 156 mg/dL — ABNORMAL HIGH (ref 70–99)
Glucose-Capillary: 163 mg/dL — ABNORMAL HIGH (ref 70–99)
Glucose-Capillary: 173 mg/dL — ABNORMAL HIGH (ref 70–99)

## 2019-03-06 MED ORDER — FUROSEMIDE 10 MG/ML IJ SOLN
20.0000 mg | Freq: Every day | INTRAMUSCULAR | Status: DC
  Administered 2019-03-06 – 2019-03-10 (×5): 20 mg via INTRAVENOUS
  Filled 2019-03-06 (×4): qty 2

## 2019-03-06 NOTE — Progress Notes (Signed)
MD made this nurse aware that Dobhoff replacement was cancelled because pt has a hiatal hernia and that was as far as they could position dobhoff and that it was okay to use as is.

## 2019-03-06 NOTE — Progress Notes (Signed)
[facility]  ROUNDING NOTE   Subjective:    Creatinine 1.6 (1.39) CO2 20 (19)  Objective:  Vital signs in last 24 hours:  Temp:  [97.8 F (36.6 C)-98.4 F (36.9 C)] 97.8 F (36.6 C) (07/08 0628) Pulse Rate:  [86-88] 86 (07/08 0628) Resp:  [18-20] 18 (07/08 0628) BP: (153-167)/(70-77) 165/72 (07/08 0628) SpO2:  [96 %-97 %] 97 % (07/08 0628)  Weight change:  Filed Weights   02/16/19 1502 02/16/19 2158  Weight: 60.8 kg 59.1 kg    Intake/Output: I/O last 3 completed shifts: In: 576.7 [NG/GT:534.3; IV Piggyback:42.5] Out: -    Intake/Output this shift:  No intake/output data recorded.  Physical Exam: General: NAD,   Head: +stomatitis  Eyes: Anicteric, PERRL  Neck: Supple, trachea midline  Lungs:  Bilateral crackles  Heart: Regular rate and rhythm  Abdomen:  Soft, nontender,   Extremities: no peripheral edema.  Neurologic: Nonfocal, moving all four extremities  Skin: No lesions       Basic Metabolic Panel: Recent Labs  Lab 02/28/19 0247  03/03/19 0551 03/03/19 1358 03/04/19 0415 03/04/19 0817 03/05/19 0342 03/06/19 0440  NA 140   < > 141  --  139 143 142 142  K 4.3   < > 5.9* 6.0* 6.1* 5.1 5.2* 4.8  CL 114*   < > 120*  --  119* 118* 118* 118*  CO2 21*   < > 18*  --  15* 19* 19* 20*  GLUCOSE 168*   < > 187*  --  173* 135* 130* 162*  BUN 45*   < > 60*  --  62* 63* 65* 71*  CREATININE 1.19*   < > 1.28*  --  1.27* 1.38* 1.39* 1.60*  CALCIUM 7.4*   < > 7.5*  --  7.7* 8.2* 8.2* 7.9*  MG 2.4  --  2.2  --   --   --   --   --    < > = values in this interval not displayed.    Liver Function Tests: No results for input(s): AST, ALT, ALKPHOS, BILITOT, PROT, ALBUMIN in the last 168 hours. No results for input(s): LIPASE, AMYLASE in the last 168 hours. No results for input(s): AMMONIA in the last 168 hours.  CBC: Recent Labs  Lab 03/04/19 0817 03/05/19 0342 03/06/19 0440  WBC 11.8* 10.8* 11.6*  HGB 7.9* 7.1* 7.1*  HCT 25.0* 23.1* 22.9*   MCV 111.6* 112.7* 111.2*  PLT 308 272 290    Cardiac Enzymes: No results for input(s): CKTOTAL, CKMB, CKMBINDEX, TROPONINI in the last 168 hours.  BNP: Invalid input(s): POCBNP  CBG: Recent Labs  Lab 03/05/19 1646 03/05/19 2016 03/06/19 0005 03/06/19 0605 03/06/19 0751  GLUCAP 97 116* 130* 156* 173*    Microbiology: Results for orders placed or performed during the hospital encounter of 02/16/19  Culture, blood (Routine x 2)     Status: None   Collection Time: 02/16/19  3:29 PM   Specimen: BLOOD  Result Value Ref Range Status   Specimen Description BLOOD LEFT ANTECUBITAL  Final   Special Requests   Final    BOTTLES DRAWN AEROBIC AND ANAEROBIC Blood Culture results may not be optimal due to an excessive volume of blood received in culture bottles   Culture   Final    NO GROWTH 5 DAYS Performed at [facility], [address] [place], [place] [address]    Report Status 02/25/2019 FINAL  Final  Culture, blood (Routine x 2)  Status: Abnormal   Collection Time: 02/16/19  4:40 PM   Specimen: BLOOD  Result Value Ref Range Status   Specimen Description   Final    BLOOD RIGHT ANTECUBITAL Performed at [facility], [place] [address] [place], [place] [address]    Special Requests   Final    BOTTLES DRAWN AEROBIC AND ANAEROBIC Blood Culture adequate volume Performed at [facility], [address] [place], [place] [address]    Culture  Setup Time   Final    GRAM POSITIVE COCCI ANAEROBIC BOTTLE ONLY CRITICAL RESULT CALLED TO, READ BACK BY AND VERIFIED WITH: Rayna Sexton ON 02/17/2019 AT 1051 QSD    Culture (A)  Final    STAPHYLOCOCCUS SPECIES (COAGULASE NEGATIVE) THE SIGNIFICANCE OF ISOLATING THIS ORGANISM FROM A SINGLE SET OF BLOOD CULTURES WHEN MULTIPLE SETS ARE DRAWN IS UNCERTAIN. PLEASE NOTIFY THE MICROBIOLOGY DEPARTMENT WITHIN ONE WEEK IF SPECIATION AND SENSITIVITIES ARE REQUIRED. Performed at [facility], [place] [address],  [place], [place] [address]    Report Status 02/19/2019 FINAL  Final  Urine culture     Status: None   Collection Time: 02/16/19  4:40 PM   Specimen: Urine, Random  Result Value Ref Range Status   Specimen Description   Final    URINE, RANDOM Performed at [facility], [address], [place], [place] [address]    Special Requests   Final    NONE Performed at [facility], [address], [place], [place] [address]    Culture   Final    NO GROWTH Performed at [facility], [place] [address], [place], [place] [address]    Report Status 02/18/2019 FINAL  Final  Blood Culture ID Panel (Reflexed)     Status: Abnormal   Collection Time: 02/16/19  4:40 PM  Result Value Ref Range Status   Enterococcus species NOT DETECTED NOT DETECTED Final   Listeria monocytogenes NOT DETECTED NOT DETECTED Final   Staphylococcus species DETECTED (A) NOT DETECTED Final    Comment: Methicillin (oxacillin) susceptible coagulase negative staphylococcus. Possible blood culture contaminant (unless isolated from more than one blood culture draw or clinical case suggests pathogenicity). No antibiotic treatment is indicated for blood  culture contaminants. CRITICAL RESULT CALLED TO, READ BACK BY AND VERIFIED WITH: Rayna Sexton ON 02/17/2019 AT 1051 QSD    Staphylococcus aureus (BCID) NOT DETECTED NOT DETECTED Final   Methicillin resistance NOT DETECTED NOT DETECTED Final   Streptococcus species NOT DETECTED NOT DETECTED Final   Streptococcus agalactiae NOT DETECTED NOT DETECTED Final   Streptococcus pneumoniae NOT DETECTED NOT DETECTED Final   Streptococcus pyogenes NOT DETECTED NOT DETECTED Final   Acinetobacter baumannii NOT DETECTED NOT DETECTED Final   Enterobacteriaceae species NOT DETECTED NOT DETECTED Final   Enterobacter cloacae complex NOT DETECTED NOT DETECTED Final   Escherichia coli NOT DETECTED NOT DETECTED Final   Klebsiella oxytoca NOT DETECTED NOT DETECTED Final   Klebsiella  pneumoniae NOT DETECTED NOT DETECTED Final   Proteus species NOT DETECTED NOT DETECTED Final   Serratia marcescens NOT DETECTED NOT DETECTED Final   Haemophilus influenzae NOT DETECTED NOT DETECTED Final   Neisseria meningitidis NOT DETECTED NOT DETECTED Final   Pseudomonas aeruginosa NOT DETECTED NOT DETECTED Final   Candida albicans NOT DETECTED NOT DETECTED Final   Candida glabrata NOT DETECTED NOT DETECTED Final   Candida krusei NOT DETECTED NOT DETECTED Final   Candida parapsilosis NOT DETECTED NOT DETECTED Final   Candida tropicalis  NOT DETECTED NOT DETECTED Final    Comment: Performed at [facility], [address] [place], [place] [address]  SARS Coronavirus 2 (CEPHEID- Performed in [facility] hospital lab), Hosp Order     Status: None   Collection Time: 02/16/19  4:42 PM   Specimen: Nasopharyngeal Swab  Result Value Ref Range Status   SARS Coronavirus 2 NEGATIVE NEGATIVE Final    Comment: (NOTE) If result is NEGATIVE SARS-CoV-2 target nucleic acids are NOT DETECTED. The SARS-CoV-2 RNA is generally detectable in upper and lower  respiratory specimens during the acute phase of infection. The lowest  concentration of SARS-CoV-2 viral copies this assay can detect is 250  copies / mL. A negative result does not preclude SARS-CoV-2 infection  and should not be used as the sole basis for treatment or other  patient management decisions.  A negative result may occur with  improper specimen collection / handling, submission of specimen other  than nasopharyngeal swab, presence of viral mutation(s) within the  areas targeted by this assay, and inadequate number of viral copies  (<250 copies / mL). A negative result must be combined with clinical  observations, patient history, and epidemiological information. If result is POSITIVE SARS-CoV-2 target nucleic acids are DETECTED. The SARS-CoV-2 RNA is generally detectable in upper and lower  respiratory specimens dur ing  the acute phase of infection.  Positive  results are indicative of active infection with SARS-CoV-2.  Clinical  correlation with patient history and other diagnostic information is  necessary to determine patient infection status.  Positive results do  not rule out bacterial infection or co-infection with other viruses. If result is PRESUMPTIVE POSTIVE SARS-CoV-2 nucleic acids MAY BE PRESENT.   A presumptive positive result was obtained on the submitted specimen  and confirmed on repeat testing.  While 2019 novel coronavirus  (SARS-CoV-2) nucleic acids may be present in the submitted sample  additional confirmatory testing may be necessary for epidemiological  and / or clinical management purposes  to differentiate between  SARS-CoV-2 and other Sarbecovirus currently known to infect humans.  If clinically indicated additional testing with an alternate test  methodology [phone] is advised. The SARS-CoV-2 RNA is generally  detectable in upper and lower respiratory sp ecimens during the acute  phase of infection. The expected result is Negative. Fact Sheet for Patients:  BoilerBrush.com.cyhttps://www.fda.gov/media/136312/download Fact Sheet for Healthcare Providers: https://pope.com/https://www.fda.gov/media/136313/download This test is not yet approved or cleared by the [place] FDA and has been authorized for detection and/or diagnosis of SARS-CoV-2 by FDA under an Emergency Use Authorization (EUA).  This EUA will remain in effect (meaning this test can be used) for the duration of the COVID-19 declaration under Section 564(b)(1) of the Act, 21 U.S.C. section 360bbb-3(b)(1), unless the authorization is terminated or revoked sooner. Performed at [facility], [address] [place], [place] [address]   Culture, blood (single) w Reflex to ID Panel     Status: None   Collection Time: 02/18/19 12:14 PM   Specimen: BLOOD  Result Value Ref Range Status   Specimen Description BLOOD BLOOD LEFT ARM  Final    Special Requests   Final    BOTTLES DRAWN AEROBIC AND ANAEROBIC Blood Culture results may not be optimal due to an excessive volume of blood received in culture bottles   Culture   Final    NO GROWTH 5 DAYS Performed at [facility], [address] [place], [place] [address]    Report Status 02/23/2019 FINAL  Final    Coagulation Studies: No results for input(s): LABPROT, INR in the last 72 hours.  Urinalysis: No results for input(s): COLORURINE, LABSPEC, PHURINE, GLUCOSEU, HGBUR, BILIRUBINUR, KETONESUR, PROTEINUR, UROBILINOGEN, NITRITE, LEUKOCYTESUR in the last 72 hours.  Invalid input(s): APPERANCEUR    Imaging: Dg Chest 1 View  Result Date: 03/04/2019 CLINICAL DATA:  NG tube placement. EXAM: CHEST  1 VIEW COMPARISON:  March 04, 2019 FINDINGS: Again noted are multifocal airspace opacities with some progression since prior study. The NG tube remains kinked within a large hiatal hernia. Overall the positioning is essentially unchanged from prior study. IMPRESSION: 1. No significant interval change in NG tube positioning. 2. Persistent multifocal airspace opacities. Electronically Signed   By: Katherine Mantle M.D.   On: 03/04/2019 23:13   Ct Head Wo Contrast  Result Date: 03/05/2019 CLINICAL DATA:  Acute mental status change EXAM: CT HEAD WITHOUT CONTRAST TECHNIQUE: Contiguous axial images were obtained from the base of the skull through the vertex without intravenous contrast. COMPARISON:  February 21, 2019 FINDINGS: Brain: No subdural, epidural, or subarachnoid hemorrhage. Ventricles and sulci are unchanged. No mass effect or midline shift. Cerebellum, brainstem, and basal cisterns are normal. A lacunar infarct in the right corona radiata is stable. No acute cortical ischemia or infarct. Vascular: No hyperdense vessel or unexpected calcification. Skull: Normal. Negative for fracture or focal lesion. Sinuses/Orbits: Mucosal thickening is seen in the maxillary sinuses. Paranasal  sinuses otherwise normal. The left mastoid air cells and middle ears are well aerated. Minimal fluid is seen in the inferior right mastoid air cells without bony erosion or overlying soft tissue swelling. Other: None. IMPRESSION: 1. No acute intracranial abnormalities. Stable lacunar infarct in the right corona radiata. 2. Sinus disease as above. 3. Mild fluid in the right mastoid air cells without bony erosion or overlying soft tissue swelling. Electronically Signed   By: Gerome Sam III M.D   On: 03/05/2019 12:12   US Venous Img Lower Bilateral  Result Date: 03/05/2019 CLINICAL DATA:  Bilateral lower extremity edema.  Evaluate for DVT. EXAM: BILATERAL LOWER EXTREMITY VENOUS DOPPLER ULTRASOUND TECHNIQUE: Gray-scale sonography with graded compression, as well as color Doppler and duplex ultrasound were performed to evaluate the lower extremity deep venous systems from the level of the common femoral vein and including the common femoral, femoral, profunda femoral, popliteal and calf veins including the posterior tibial, peroneal and gastrocnemius veins when visible. The superficial great saphenous vein was also interrogated. Spectral Doppler was utilized to evaluate flow at rest and with distal augmentation maneuvers in the common femoral, femoral and popliteal veins. COMPARISON:  None. FINDINGS: RIGHT LOWER EXTREMITY Common Femoral Vein: No evidence of thrombus. Normal compressibility, respiratory phasicity and response to augmentation. Saphenofemoral Junction: No evidence of thrombus. Normal compressibility and flow on color Doppler imaging. Profunda Femoral Vein: No evidence of thrombus. Normal compressibility and flow on color Doppler imaging. Femoral Vein: No evidence of thrombus. Normal compressibility, respiratory phasicity and response to augmentation. Popliteal Vein: No evidence of thrombus. Normal compressibility, respiratory phasicity and response to augmentation. Calf Veins: No evidence of  thrombus. Normal compressibility and flow on color Doppler imaging. Superficial Great Saphenous Vein: No evidence of thrombus. Normal compressibility. Venous Reflux:  None. Other Findings:  None. LEFT LOWER EXTREMITY Common Femoral Vein: No evidence of thrombus. Normal compressibility, respiratory phasicity and response to augmentation. Saphenofemoral Junction: No evidence of thrombus. Normal compressibility and flow on color Doppler imaging. Profunda Femoral Vein: No evidence of thrombus. Normal compressibility and flow on  color Doppler imaging. Femoral Vein: No evidence of thrombus. Normal compressibility, respiratory phasicity and response to augmentation. Popliteal Vein: No evidence of thrombus. Normal compressibility, respiratory phasicity and response to augmentation. Calf Veins: No evidence of thrombus. Normal compressibility and flow on color Doppler imaging. Superficial Great Saphenous Vein: No evidence of thrombus. Normal compressibility. Venous Reflux:  None. Other Findings:  None. IMPRESSION: No evidence of DVT within either lower extremity. Electronically Signed   By: Simonne ComeJohn  Watts M.D.   On: 03/05/2019 09:54   Dg Vangie BickerNaso G Tube Plc W/fl W/rad  Result Date: 03/05/2019 CLINICAL DATA:  Placement of feeding tube.  They are contrast EXAM: NASO G TUBE PLACEMENT WITH FL AND WITH RAD CONTRAST:  30 cc of Isovue 300 FLUOROSCOPY TIME:  Fluoroscopy Time:  1.7 minutes Radiation Exposure Index (if provided by the fluoroscopic device): 20.3 Number of Acquired Spot Images: 0 COMPARISON:  None. FINDINGS: The patient presented to our department with a feeding tube coiled in her hiatal hernia. The feeding tube would not flush with saline. As a result, it was removed. A new feeding tube was then placed. The new tube also coils in the hiatal hernia and could not be advanced past the hiatal hernia. The tube was injected to ensure appropriate flushing. IMPRESSION: Feeding tube replacement as above. The tube coils within the  hiatal hernia. Electronically Signed   By: Gerome Samavid  Williams III M.D   On: 03/05/2019 16:08     Medications:   . piperacillin-tazobactam (ZOSYN)  IV 3.375 g (03/06/19 1012)   . B-complex with vitamin C  1 tablet Oral Daily  . bisacodyl  10 mg Rectal Daily  . ciprofloxacin  1 drop Both Eyes Q4H while awake  . feeding supplement (NEPRO CARB STEADY)  1,000 mL Per Tube Q24H  . folic acid  1 mg Oral Daily  . free water  30 mL Per Tube Q4H  . miconazole   Topical Daily  . multivitamin with minerals  1 tablet Oral Daily  . nystatin   Topical BID  . pantoprazole (PROTONIX) IV  40 mg Intravenous Q12H  . sodium zirconium cyclosilicate  10 g Oral BID  . tamsulosin  0.4 mg Oral Daily  . thiamine  100 mg Oral Daily  . vitamin C  250 mg Oral BID   acetaminophen **OR** acetaminophen, acetaminophen, lidocaine, magic mouthwash **AND** lidocaine, lip balm, ondansetron **OR** ondansetron (ZOFRAN) IV, senna, sodium chloride flush  Assessment/ Plan:  Ms. Claudia Case is a 78 y.o. white female with osteoarthritis, GERD, hyperlipidemia, hypertension, psoriatic arthritis, who was [facility] to [facility]/16/2019 for evaluation of severe stomatitis in response to methotrexate.  1.  Acute renal failure, possibly due to high protein content tube feeds with dehydration. 2.  Hyperkalemia  3.  Metabolic acidosis  4.  Drug reaction with severe stomatitis secondary to methotrexate. 5. Anemia with renal failure.  6. Hypertension - most likely volume driven.   Plan: - Start Furosemide IV 20mg  daily - Continue sodium zirconium   LOS: 18 Joelyn Lover 7/8/202012:01 PM

## 2019-03-06 NOTE — Progress Notes (Signed)
[place] [facility] [place] NAME: Claudia Case    MR#:  [id number]  DATE OF BIRTH:  1941-07-30  SUBJECTIVE:  CHIEF COMPLAINT:   Chief Complaint  Patient presents with  . Hematuria  . Emesis   No new complaint this morning.  No fevers.  Patient has Dobbhoff tube in place and tolerating tube feeds. REVIEW OF SYSTEMS:  Review of Systems  Constitutional: Negative for chills and fever.  HENT: Negative for hearing loss and tinnitus.   Eyes: Negative for blurred vision and double vision.  Respiratory: Negative for cough and shortness of breath.   Cardiovascular: Negative for chest pain and palpitations.  Gastrointestinal: Negative for heartburn and nausea.       Irritated oral mucosa  Genitourinary: Negative for dysuria and urgency.  Musculoskeletal: Negative for myalgias and neck pain.  Skin: Negative for itching and rash.  Neurological: Negative for dizziness and headaches.  Psychiatric/Behavioral: Negative for depression and hallucinations.    DRUG ALLERGIES:  No Known Allergies VITALS:  Blood pressure (!) 165/72, pulse 86, temperature 97.8 F (36.6 C), resp. rate 18, height 5\' 4"  (1.626 m), weight 59.1 kg, SpO2 97 %. PHYSICAL EXAMINATION:   Physical Exam  Constitutional: She is oriented to person, place, and time. She appears well-developed.  HENT:  Head: Normocephalic.  Right Ear: External ear normal.  Douboff tube in place.  Eyes: Pupils are equal, round, and reactive to light. Conjunctivae are normal. Right eye exhibits no discharge.  Neck: Normal range of motion. Neck supple. No thyromegaly present.  Cardiovascular: Normal rate, regular rhythm and normal heart sounds.  Respiratory: Effort normal and breath sounds normal.  GI: Soft. Bowel sounds are normal. She exhibits no distension. There is no abdominal tenderness.  Musculoskeletal: Normal range of motion.        General: No edema.     Comments: Wrist and ankle swelling   Neurological: She is alert and oriented to person, place, and time.  Skin: Skin is warm. She is not diaphoretic. No erythema.  Psychiatric: She has a normal mood and affect. Her behavior is normal.   LABORATORY PANEL:  Female CBC Recent Labs  Lab 03/06/19 0440  WBC 11.6*  HGB 7.1*  HCT 22.9*  PLT 290   ------------------------------------------------------------------------------------------------------------------ Chemistries  Recent Labs  Lab 03/03/19 0551  03/06/19 0440  NA 141   < > 142  K 5.9*   < > 4.8  CL 120*   < > 118*  CO2 18*   < > 20*  GLUCOSE 187*   < > 162*  BUN 60*   < > 71*  CREATININE 1.28*   < > 1.60*  CALCIUM 7.5*   < > 7.9*  MG 2.2  --   --    < > = values in this interval not displayed.   RADIOLOGY:  Dg Naso G Tube Plc W/fl W/rad  Result Date: 03/05/2019 CLINICAL DATA:  Placement of feeding tube.  They are contrast EXAM: NASO G TUBE PLACEMENT WITH FL AND WITH RAD CONTRAST:  30 cc of Isovue 300 FLUOROSCOPY TIME:  Fluoroscopy Time:  1.7 minutes Radiation Exposure Index (if provided by the fluoroscopic device): 20.3 Number of Acquired Spot Images: 0 COMPARISON:  None. FINDINGS: The patient presented to our department with a feeding tube coiled in her hiatal hernia. The feeding tube would not flush with saline. As a result, it was removed. A new feeding tube was then placed. The new tube also coils  in the hiatal hernia and could not be advanced past the hiatal hernia. The tube was injected to ensure appropriate flushing. IMPRESSION: Feeding tube replacement as above. The tube coils within the hiatal hernia. Electronically Signed   By: Gerome Samavid  Williams III M.D   On: 03/05/2019 16:08   ASSESSMENT AND PLAN:   1. Tachypnea with acute pulmonary edema and fluid overload.  Patient being diuresed with IV Lasix.  On empiric antibiotics with IV Zosyn to cover for probable pneumonia.  CTA chest not done due to renal failure.  VQ scan not done because patient unable to lay  flat.  Bilateral lower extremity venous Doppler ultrasound negative for DVT.  Clinical probably T for PE very low.    2. Severe oral mucositis and thrush.  Continue nystatin Viscous Lidocaine and chlorhexidine. 3. Failure to thrive and severe malnutrition.: Douboff tube functioning well.  Getting nutrition via tube while awaiting healing of oral mucositis 4. Acute kidney injury and dehydration.    Being followed by nephrologist.  Felt to be likely due to dehydration.  Due to recent fluid overload patient being diuresed with Lasix. 5. Hyperkalemia.  Better after Lasix 6. Neutropenic fever on admission secondary to methotrexate.  History of psoriatic arthritis for which patient was previously on methotrexate. Finished broad-spectrum antibiotics.  Neutropenia resolved.  Remains afebrile. 7. Pancytopenia secondary to methotrexate toxicity.  Status post leucovorin and platelet transfusion.  All blood counts have improved. 8. Elevated liver function tests and jaundice.  This also has improved. 9. Hypernatremia.  This has improved. 10. Conjunctivitis start Ciloxan eyedrops Patient seen by palliative care previously.  Notified that patient has a healthcare power of attorney Claudia Case.  Nursing staff notified palliative care team to discuss further plan of care with now updated healthcare power of attorney in the chart.  DVT prophylaxis; SCDs Holding off on heparin products due to recent thrombocytopenia and very inflamed oral mucosa.  All the records are reviewed and case discussed with Care Management/Social Worker. Management plans discussed with the patient, family and they are in agreement. Called and updated patient's sister Ms. Lala on treatment plans as outlined above.  She agrees with plan of care.  CODE STATUS: Full Code  TOTAL TIME TAKING CARE OF THIS PATIENT: 37 minutes.   More than 50% of the time was spent in counseling/coordination of care: YES  POSSIBLE D/C IN 4 DAYS, DEPENDING ON  CLINICAL CONDITION.   Phylis Javed M.D on 03/06/2019 at 2:47 PM  Between 7am to 6pm - Pager - [phone]  After 6pm go to [email or web address] - Social research officer, governmentpassword [email or web address] [facility]  [facility]  Office  [phone]  CC: Primary care physician; Sula RumpleVirk, Charanjit, MD  Note: This dictation was prepared with Dragon dictation along with smaller phrase technology. Any transcriptional errors that result from this process are unintentional.

## 2019-03-06 NOTE — Progress Notes (Signed)
Nutrition Follow-up  DOCUMENTATION CODES:   Not applicable  INTERVENTION:  Continue Nepro at 35 mL/hr. Provides 1512 kcal, 68 grams of protein, 21 grams of dietary fiber, 613 mL H2O daily.  Continue minimum free water flush of 30 mL Q4hrs to maintain tube patency.  Recommend measuring daily weights.  Continue MVI daily, B-complex with C daily, vitamin C 250 mg BID.  NUTRITION DIAGNOSIS:   Inadequate oral intake related to acute illness(thrush, nausea, vomiting) as evidenced by meal completion < 25%.  Ongoing - addressing with tube feeds.  GOAL:   Patient will meet greater than or equal to 90% of their needs  Met with tube feeds.  MONITOR:   PO intake, Supplement acceptance, Labs, Weight trends, Skin, I & O's  REASON FOR ASSESSMENT:   Consult Assessment of nutrition requirement/status  ASSESSMENT:   78 y.o. female with a history of psoriatic arthritis on methotrexate presented to the emergency room on 02/16/2019 with 1 week history of sore throat, difficulty swallowing, blood in urine and rash.  Patient continues to tolerate tube feeds. Yesterday tube was replaced by IR as it was not flushing. Once again could not be advanced past hiatal hernia but patient has been tolerating feeds in hernia. Noted concern for high protein content of tube feeds but patient is actually only receiving ~1.15 grams/kg of protein from TF regimen, which is on the low end of needs.  Enteral Access: Dobbhoff tube placed 7/7 by IR; terminates in hiatal hernia  TF: Nepro at 35 mL/hr + free water flush 30 mL Q4hrs  Medications reviewed and include: B-complex with C daily, Dulcolax 10 mg daily, folic acid 1 mg daily, Lasix 20 mg daily IV, MVI daily, pantoprazole, Lokelma, Flomax, thiamine 100 mg daily, vitamin C 250 mg BID, Zosyn.  Labs reviewed: CBG 130-173, Chloride 118, CO2 20, BUN 71, Creatinine 1.6.  I/O: UOP unmeasured  No weight taken since admission to trend.  Diet Order:   Diet Order             Diet full liquid Room service appropriate? Yes with Assist; Fluid consistency: Thin  Diet effective now             EDUCATION NEEDS:   Not appropriate for education at this time  Skin:  Skin Assessment: Skin Integrity Issues:(MSAD to buttocks and groin)  Last BM:  03/06/2019 - medium type 6  Height:   Ht Readings from Last 1 Encounters:  02/16/19 '5\' 4"'  (1.626 m)   Weight:   Wt Readings from Last 1 Encounters:  02/16/19 59.1 kg   Ideal Body Weight:  54.5 kg  BMI:  Body mass index is 22.35 kg/m.  Estimated Nutritional Needs:   Kcal:  1400-1600kcal/day  Protein:  70-80g/day  Fluid:  >1.4L/day  Willey Blade, MS, RD, LDN Office: [phone] Pager: [phone] After Hours/Weekend Pager: [phone]

## 2019-03-06 NOTE — Care Management Important Message (Signed)
Important Message  Patient Details  Name: Claudia Case MRN: [id number] Date of Birth: 03-10-1941   Medicare Important Message Given:  Yes     Juliann Pulse A Anzley Dibbern 03/06/2019, 11:02 AM

## 2019-03-06 NOTE — Progress Notes (Signed)
Physical Therapy Treatment Patient Details Name: Claudia Case MRN: [id number] DOB: 1941-03-15 Today's Date: 03/06/2019    History of Present Illness Patient is a 78 year old female that presents with weakness, mouth sores, pain in her hands, and nausea/vomiting. She was found to have pancytopenia, elevated bilirubin levels. She has a history of RA, which has been managed with methotrexate.    PT Comments     Chart reviewed.  HgB remains low 7.1 HCT 22.9.  Session limited by writer due to numbers.  Pt in bed.  Agrees to session.  Pt is confused.  Thinks she is laying on the floor in her kitchen at home.  Wanting to get up and get me a drink. Asks several times to go over to the kitchen sink, pointing at computer, to get a drink.   Pt re-orientated to situation but she did not seem to remember long.  She is able to participate in supine ex as below with good effort and generally good leg strength only needing tactile cues to continue.  HR remained in 90's and O2 sats 92-94 % on 2.5 lpm.  External mouth care provided due to large amount of dried crusty dark red/black material on her lips.     Follow Up Recommendations  SNF;Supervision for mobility/OOB     Equipment Recommendations  Rolling walker with 5" wheels;Wheelchair (measurements PT);Wheelchair cushion (measurements PT);3in1 (PT)    Recommendations for Other Services       Precautions / Restrictions Precautions Precautions: Fall Precaution Comments: HgB and HCT levels Restrictions Weight Bearing Restrictions: No    Mobility  Bed Mobility               General bed mobility comments: deferred  Transfers                 General transfer comment: deferred  Ambulation/Gait                 Stairs             Wheelchair Mobility    Modified Rankin (Stroke Patients Only)       Balance                                            Cognition Arousal/Alertness:  Awake/alert Behavior During Therapy: WFL for tasks assessed/performed Overall Cognitive Status: Impaired/Different from baseline                                        Exercises Other Exercises Other Exercises: supine ex A/AAROM ankle pumps, heel slides, ab/add and SLR 2 x 10    General Comments        Pertinent Vitals/Pain Pain Assessment: No/denies pain    Home Living                      Prior Function            PT Goals (current goals can now be found in the care plan section) Progress towards PT goals: Not progressing toward goals - comment    Frequency    Min 2X/week      PT Plan Current plan remains appropriate    Co-evaluation  AM-PAC PT "6 Clicks" Mobility   Outcome Measure  Help needed turning from your back to your side while in a flat bed without using bedrails?: A Lot Help needed moving from lying on your back to sitting on the side of a flat bed without using bedrails?: A Lot Help needed moving to and from a bed to a chair (including a wheelchair)?: A Lot Help needed standing up from a chair using your arms (e.g., wheelchair or bedside chair)?: A Lot Help needed to walk in hospital room?: Total Help needed climbing 3-5 steps with a railing? : Total 6 Click Score: 10    End of Session   Activity Tolerance: Patient tolerated treatment well Patient left: in bed;with call bell/phone within reach;with bed alarm set         Time: 1105-1120 PT Time Calculation (min) (ACUTE ONLY): 15 min  Charges:  $Therapeutic Exercise: 8-22 mins                    Danielle DessSarah Adelheid Hoggard, PTA 03/06/19, 12:05 PM

## 2019-03-06 NOTE — Progress Notes (Signed)
Palliative: Claudia Case, is sleeping soundly in the bed.  She had many tests and procedures yesterday, and is clearly fatigued.  PMT discusses patient needs/condition with bedside nursing staff with plans to return later in the day.  Conference with nursing staff related to patient family dynamics/HC POA.  Power of attorney paperwork reviewed.  Claudia Case had named her husband first, he has passed, then her Sister Claudia Case.  This document gives [place], not healthcare power of attorney.  Return later in the day to see Claudia Case.  She appears acutely/chronically ill and frail.  She is oriented to self only at this time.  There is no family at bedside at this time due to visitor restrictions.  [name] continues to ask about calling her sister so that she can "go home".  I share that she needs to stay here in the hospital with Claudia Case so that she can continue to get better.  She states that she feels that she can manage at home with the help of her sisters.  We briefly talked about CODE STATUS, she states she would not want life support.  Call to sister, Claudia Case at [phone].  Left voicemail message requesting return call.  Conference with attending, nursing staff, case management, pharmacy related to patient condition, needs, family dynamics.  Plan: At this point continue to treat the treatable.  Both patient and sister Claudia Case endorse DNR, CODE STATUS not changed today, further discussion needed  50 minutes, extended time  Quinn Axe, NP Palliative Medicine Team Team Phone # [phone] Greater than 50% of this time was spent counseling and coordinating care related to the above assessment and plan.

## 2019-03-07 ENCOUNTER — Inpatient Hospital Stay: Payer: Medicare HMO

## 2019-03-07 LAB — BASIC METABOLIC PANEL
Anion gap: 9 (ref 5–15)
BUN: 71 mg/dL — ABNORMAL HIGH (ref 8–23)
CO2: 19 mmol/L — ABNORMAL LOW (ref 22–32)
Calcium: 8.1 mg/dL — ABNORMAL LOW (ref 8.9–10.3)
Chloride: 117 mmol/L — ABNORMAL HIGH (ref 98–111)
Creatinine, Ser: 1.46 mg/dL — ABNORMAL HIGH (ref 0.44–1.00)
GFR calc Af Amer: 40 mL/min — ABNORMAL LOW (ref >60)
GFR calc non Af Amer: 34 mL/min — ABNORMAL LOW (ref >60)
Glucose, Bld: 172 mg/dL — ABNORMAL HIGH (ref 70–99)
Potassium: 3.9 mmol/L (ref 3.5–5.1)
Sodium: 145 mmol/L (ref 135–145)

## 2019-03-07 LAB — CBC
HCT: 23.6 % — ABNORMAL LOW (ref 36.0–46.0)
Hemoglobin: 7.3 g/dL — ABNORMAL LOW (ref 12.0–15.0)
MCH: 34.3 pg — ABNORMAL HIGH (ref 26.0–34.0)
MCHC: 30.9 g/dL (ref 30.0–36.0)
MCV: 110.8 fL — ABNORMAL HIGH (ref 80.0–100.0)
Platelets: 299 10*3/uL (ref 150–400)
RBC: 2.13 MIL/uL — ABNORMAL LOW (ref 3.87–5.11)
RDW: 15.5 % (ref 11.5–15.5)
WBC: 12.7 10*3/uL — ABNORMAL HIGH (ref 4.0–10.5)
nRBC: 0 % (ref 0.0–0.2)

## 2019-03-07 LAB — MISC LABCORP TEST (SEND OUT): Labcorp test code: 70115

## 2019-03-07 LAB — GLUCOSE, CAPILLARY
Glucose-Capillary: 155 mg/dL — ABNORMAL HIGH (ref 70–99)
Glucose-Capillary: 134 mg/dL — ABNORMAL HIGH (ref 70–99)
Glucose-Capillary: 108 mg/dL — ABNORMAL HIGH (ref 70–99)
Glucose-Capillary: 126 mg/dL — ABNORMAL HIGH (ref 70–99)
Glucose-Capillary: 132 mg/dL — ABNORMAL HIGH (ref 70–99)

## 2019-03-07 LAB — MAGNESIUM: Magnesium: 2.2 mg/dL (ref 1.7–2.4)

## 2019-03-07 LAB — PHOSPHORUS: Phosphorus: 5.3 mg/dL — ABNORMAL HIGH (ref 2.5–4.6)

## 2019-03-07 MED ORDER — IOHEXOL 300 MG/ML  SOLN
10.0000 mL | Freq: Once | INTRAMUSCULAR | Status: AC | PRN
  Administered 2019-03-07: 10:00:00 10 mL

## 2019-03-07 MED ORDER — IOPAMIDOL (ISOVUE-300) INJECTION 61%: 10.0000 mL | Freq: Once | INTRAVENOUS | Status: DC | PRN

## 2019-03-07 MED ORDER — PIPERACILLIN-TAZOBACTAM 3.375 G IVPB
3.3750 g | Freq: Three times a day (TID) | INTRAVENOUS | Status: DC
  Administered 2019-03-07 – 2019-03-13 (×16): 3.375 g via INTRAVENOUS
  Filled 2019-03-07 (×15): qty 50

## 2019-03-07 MED ORDER — SODIUM CHLORIDE (PF) 0.9 % IJ SOLN
10.0000 mL | Freq: Once | INTRAMUSCULAR | Status: AC
  Administered 2019-03-07: 10 mL

## 2019-03-07 MED ORDER — SODIUM CHLORIDE 0.9 % IV SOLN: INTRAVENOUS | Status: DC

## 2019-03-07 NOTE — Progress Notes (Signed)
Dr. Marcille Blanco notified of pt pulling out Dobhoff NG tube. Pt confused at Baseline, alert to self. Didn't realize she pulled it out. Will continue to monitor. FSBS 155 at this time. VS stable.

## 2019-03-07 NOTE — Progress Notes (Signed)
[facility]  ROUNDING NOTE   Subjective:   Shortness of breath has improved  Objective:  Vital signs in last 24 hours:  Temp:  [96.9 F (36.1 C)-97.6 F (36.4 C)] 97.6 F (36.4 C) (07/09 0435) Pulse Rate:  [81-84] 83 (07/09 0435) Resp:  [16-18] 18 (07/09 0435) BP: (156-168)/(65-70) 156/70 (07/09 0435) SpO2:  [93 %-98 %] 93 % (07/09 0435)  Weight change:  Filed Weights   02/16/19 1502 02/16/19 2158  Weight: 60.8 kg 59.1 kg    Intake/Output: I/O last 3 completed shifts: In: 474.3 [NG/GT:474.3] Out: 800 [Urine:800]   Intake/Output this shift:  No intake/output data recorded.  Physical Exam: General: NAD,   Head: +stomatitis  Eyes: Anicteric, PERRL  Neck: Supple, trachea midline  Lungs:  Bilateral crackles  Heart: Regular rate and rhythm  Abdomen:  Soft, nontender,   Extremities: no peripheral edema.  Neurologic: Nonfocal, moving all four extremities  Skin: No lesions       Basic Metabolic Panel: Recent Labs  Lab 03/03/19 0551  03/04/19 0415 03/04/19 0817 03/05/19 0342 03/06/19 0440 03/07/19 0314  NA 141  --  139 143 142 142 145  K 5.9*   < > 6.1* 5.1 5.2* 4.8 3.9  CL 120*  --  119* 118* 118* 118* 117*  CO2 18*  --  15* 19* 19* 20* 19*  GLUCOSE 187*  --  173* 135* 130* 162* 172*  BUN 60*  --  62* 63* 65* 71* 71*  CREATININE 1.28*  --  1.27* 1.38* 1.39* 1.60* 1.46*  CALCIUM 7.5*  --  7.7* 8.2* 8.2* 7.9* 8.1*  MG 2.2  --   --   --   --   --  2.2  PHOS  --   --   --   --   --   --  5.3*   < > = values in this interval not displayed.    Liver Function Tests: No results for input(s): AST, ALT, ALKPHOS, BILITOT, PROT, ALBUMIN in the last 168 hours. No results for input(s): LIPASE, AMYLASE in the last 168 hours. No results for input(s): AMMONIA in the last 168 hours.  CBC: Recent Labs  Lab 03/04/19 0817 03/05/19 0342 03/06/19 0440 03/07/19 0314  WBC 11.8* 10.8* 11.6* 12.7*  HGB 7.9* 7.1* 7.1* 7.3*  HCT 25.0* 23.1* 22.9* 23.6*  MCV  111.6* 112.7* 111.2* 110.8*  PLT 308 272 290 299    Cardiac Enzymes: No results for input(s): CKTOTAL, CKMB, CKMBINDEX, TROPONINI in the last 168 hours.  BNP: Invalid input(s): POCBNP  CBG: Recent Labs  Lab 03/06/19 2041 03/06/19 2357 03/07/19 0408 03/07/19 0743 03/07/19 1156  GLUCAP 146* 146* 155* 134* 108*    Microbiology: Results for orders placed or performed during the hospital encounter of 02/16/19  Culture, blood (Routine x 2)     Status: None   Collection Time: 02/16/19  3:29 PM   Specimen: BLOOD  Result Value Ref Range Status   Specimen Description BLOOD LEFT ANTECUBITAL  Final   Special Requests   Final    BOTTLES DRAWN AEROBIC AND ANAEROBIC Blood Culture results may not be optimal due to an excessive volume of blood received in culture bottles   Culture   Final    NO GROWTH 5 DAYS Performed at [facility], [address] [place], [place] [address]    Report Status 02/25/2019 FINAL  Final  Culture, blood (Routine x 2)     Status: Abnormal   Collection Time: 02/16/19  4:40 PM   Specimen: BLOOD  Result Value Ref Range Status   Specimen Description   Final    BLOOD RIGHT ANTECUBITAL Performed at [facility], [address] [place], [place] [address]    Special Requests   Final    BOTTLES DRAWN AEROBIC AND ANAEROBIC Blood Culture adequate volume Performed at [facility], [address] [place], [place] [address]    Culture  Setup Time   Final    GRAM POSITIVE COCCI ANAEROBIC BOTTLE ONLY CRITICAL RESULT CALLED TO, READ BACK BY AND VERIFIED WITH: Crist Fat ON 02/17/2019 AT 1051 QSD    Culture (A)  Final    STAPHYLOCOCCUS SPECIES (COAGULASE NEGATIVE) THE SIGNIFICANCE OF ISOLATING THIS ORGANISM FROM A SINGLE SET OF BLOOD CULTURES WHEN MULTIPLE SETS ARE DRAWN IS UNCERTAIN. PLEASE NOTIFY THE MICROBIOLOGY DEPARTMENT WITHIN ONE WEEK IF SPECIATION AND SENSITIVITIES ARE REQUIRED. Performed at [facility], [address],  [place], [place] [address]    Report Status 02/19/2019 FINAL  Final  Urine culture     Status: None   Collection Time: 02/16/19  4:40 PM   Specimen: Urine, Random  Result Value Ref Range Status   Specimen Description   Final    URINE, RANDOM Performed at [facility], [address], [place], [place] [address]    Special Requests   Final    NONE Performed at [facility], [address] [place], [place] [address]    Culture   Final    NO GROWTH Performed at [facility], [address], [place], [place] [address]    Report Status 02/18/2019 FINAL  Final  Blood Culture ID Panel (Reflexed)     Status: Abnormal   Collection Time: 02/16/19  4:40 PM  Result Value Ref Range Status   Enterococcus species NOT DETECTED NOT DETECTED Final   Listeria monocytogenes NOT DETECTED NOT DETECTED Final   Staphylococcus species DETECTED (A) NOT DETECTED Final    Comment: Methicillin (oxacillin) susceptible coagulase negative staphylococcus. Possible blood culture contaminant (unless isolated from more than one blood culture draw or clinical case suggests pathogenicity). No antibiotic treatment is indicated for blood  culture contaminants. CRITICAL RESULT CALLED TO, READ BACK BY AND VERIFIED WITH: Crist Fat ON 02/17/2019 AT 1051 QSD    Staphylococcus aureus (BCID) NOT DETECTED NOT DETECTED Final   Methicillin resistance NOT DETECTED NOT DETECTED Final   Streptococcus species NOT DETECTED NOT DETECTED Final   Streptococcus agalactiae NOT DETECTED NOT DETECTED Final   Streptococcus pneumoniae NOT DETECTED NOT DETECTED Final   Streptococcus pyogenes NOT DETECTED NOT DETECTED Final   Acinetobacter baumannii NOT DETECTED NOT DETECTED Final   Enterobacteriaceae species NOT DETECTED NOT DETECTED Final   Enterobacter cloacae complex NOT DETECTED NOT DETECTED Final   Escherichia coli NOT DETECTED NOT DETECTED Final   Klebsiella oxytoca NOT DETECTED NOT DETECTED Final   Klebsiella  pneumoniae NOT DETECTED NOT DETECTED Final   Proteus species NOT DETECTED NOT DETECTED Final   Serratia marcescens NOT DETECTED NOT DETECTED Final   Haemophilus influenzae NOT DETECTED NOT DETECTED Final   Neisseria meningitidis NOT DETECTED NOT DETECTED Final   Pseudomonas aeruginosa NOT DETECTED NOT DETECTED Final   Candida albicans NOT DETECTED NOT DETECTED Final   Candida glabrata NOT DETECTED NOT DETECTED Final   Candida krusei NOT DETECTED NOT DETECTED Final   Candida parapsilosis NOT DETECTED NOT DETECTED Final   Candida tropicalis NOT DETECTED NOT DETECTED Final  Comment: Performed at [facility], [address] [place], [place] [address]  SARS Coronavirus 2 (CEPHEID- Performed in [facility] hospital lab), Hosp Order     Status: None   Collection Time: 02/16/19  4:42 PM   Specimen: Nasopharyngeal Swab  Result Value Ref Range Status   SARS Coronavirus 2 NEGATIVE NEGATIVE Final    Comment: (NOTE) If result is NEGATIVE SARS-CoV-2 target nucleic acids are NOT DETECTED. The SARS-CoV-2 RNA is generally detectable in upper and lower  respiratory specimens during the acute phase of infection. The lowest  concentration of SARS-CoV-2 viral copies this assay can detect is 250  copies / mL. A negative result does not preclude SARS-CoV-2 infection  and should not be used as the sole basis for treatment or other  patient management decisions.  A negative result may occur with  improper specimen collection / handling, submission of specimen other  than nasopharyngeal swab, presence of viral mutation(s) within the  areas targeted by this assay, and inadequate number of viral copies  (<250 copies / mL). A negative result must be combined with clinical  observations, patient history, and epidemiological information. If result is POSITIVE SARS-CoV-2 target nucleic acids are DETECTED. The SARS-CoV-2 RNA is generally detectable in upper and lower  respiratory specimens dur ing  the acute phase of infection.  Positive  results are indicative of active infection with SARS-CoV-2.  Clinical  correlation with patient history and other diagnostic information is  necessary to determine patient infection status.  Positive results do  not rule out bacterial infection or co-infection with other viruses. If result is PRESUMPTIVE POSTIVE SARS-CoV-2 nucleic acids MAY BE PRESENT.   A presumptive positive result was obtained on the submitted specimen  and confirmed on repeat testing.  While 2019 novel coronavirus  (SARS-CoV-2) nucleic acids may be present in the submitted sample  additional confirmatory testing may be necessary for epidemiological  and / or clinical management purposes  to differentiate between  SARS-CoV-2 and other Sarbecovirus currently known to infect humans.  If clinically indicated additional testing with an alternate test  methodology [phone]([id number]) is advised. The SARS-CoV-2 RNA is generally  detectable in upper and lower respiratory sp ecimens during the acute  phase of infection. The expected result is Negative. Fact Sheet for Patients:  BoilerBrush.com.cyhttps://www.fda.gov/media/136312/download Fact Sheet for Healthcare Providers: https://pope.com/https://www.fda.gov/media/136313/download This test is not yet approved or cleared by the [place] FDA and has been authorized for detection and/or diagnosis of SARS-CoV-2 by FDA under an Emergency Use Authorization (EUA).  This EUA will remain in effect (meaning this test can be used) for the duration of the COVID-19 declaration under Section 564(b)(1) of the Act, 21 U.S.C. section 360bbb-3(b)(1), unless the authorization is terminated or revoked sooner. Performed at [facility], [address] [place], [place] [address]   Culture, blood (single) w Reflex to ID Panel     Status: None   Collection Time: 02/18/19 12:14 PM   Specimen: BLOOD  Result Value Ref Range Status   Specimen Description BLOOD BLOOD LEFT ARM  Final    Special Requests   Final    BOTTLES DRAWN AEROBIC AND ANAEROBIC Blood Culture results may not be optimal due to an excessive volume of blood received in culture bottles   Culture   Final    NO GROWTH 5 DAYS Performed at [facility], [address] [place], [place] [address]    Report Status 02/23/2019 FINAL  Final    Coagulation Studies: No results  for input(s): LABPROT, INR in the last 72 hours.  Urinalysis: No results for input(s): COLORURINE, LABSPEC, PHURINE, GLUCOSEU, HGBUR, BILIRUBINUR, KETONESUR, PROTEINUR, UROBILINOGEN, NITRITE, LEUKOCYTESUR in the last 72 hours.  Invalid input(s): APPERANCEUR    Imaging: Dg Naso G Tube Plc W/fl W/rad  Result Date: 03/07/2019 CLINICAL DATA:  Failure to thrive, patient removed previously placed Dobbhoff tube. EXAM: NASO G TUBE PLACEMENT WITH FL AND WITH RAD CONTRAST:  10 mL Omnipaque FLUOROSCOPY TIME:  Fluoroscopy Time:  24 seconds Radiation Exposure Index (if provided by the fluoroscopic device): 4.6 mGy Number of Acquired Spot Images: 0 COMPARISON:  None. FINDINGS: Real-time fluoroscopy was utilized for placement of a Dobbhoff tube. Dobbhoff tube was inserted through the left nare without difficulty. Large hiatal hernia. Dobbhoff tube coiled within the hiatal hernia. Tip of the Dobbhoff tube could not be advanced into the small bowel after multiple attempts at manipulation of the tube. The tube was left in place with the tip within hiatal hernia and confirmed with injection of 10 mL of Omnipaque 300. IMPRESSION: Dobbhoff tube placement with the tip in the stomach within a large hiatal hernia. Electronically Signed   By: Elige KoHetal  Patel   On: 03/07/2019 10:33   Dg Basil DessNaso G Tube Plc W/fl W/rad  Result Date: 03/05/2019 CLINICAL DATA:  Placement of feeding tube.  They are contrast EXAM: NASO G TUBE PLACEMENT WITH FL AND WITH RAD CONTRAST:  30 cc of Isovue 300 FLUOROSCOPY TIME:  Fluoroscopy Time:  1.7 minutes Radiation Exposure Index (if provided  by the fluoroscopic device): 20.3 Number of Acquired Spot Images: 0 COMPARISON:  None. FINDINGS: The patient presented to our department with a feeding tube coiled in her hiatal hernia. The feeding tube would not flush with saline. As a result, it was removed. A new feeding tube was then placed. The new tube also coils in the hiatal hernia and could not be advanced past the hiatal hernia. The tube was injected to ensure appropriate flushing. IMPRESSION: Feeding tube replacement as above. The tube coils within the hiatal hernia. Electronically Signed   By: Gerome Samavid  Williams III M.D   On: 03/05/2019 16:08     Medications:   . piperacillin-tazobactam (ZOSYN)  IV     . B-complex with vitamin C  1 tablet Oral Daily  . bisacodyl  10 mg Rectal Daily  . ciprofloxacin  1 drop Both Eyes Q4H while awake  . feeding supplement (NEPRO CARB STEADY)  1,000 mL Per Tube Q24H  . folic acid  1 mg Oral Daily  . free water  30 mL Per Tube Q4H  . furosemide  20 mg Intravenous Daily  . miconazole   Topical Daily  . multivitamin with minerals  1 tablet Oral Daily  . nystatin   Topical BID  . pantoprazole (PROTONIX) IV  40 mg Intravenous Q12H  . sodium zirconium cyclosilicate  10 g Oral BID  . tamsulosin  0.4 mg Oral Daily  . thiamine  100 mg Oral Daily  . vitamin C  250 mg Oral BID   acetaminophen **OR** acetaminophen, acetaminophen, iopamidol, lidocaine, magic mouthwash **AND** lidocaine, lip balm, ondansetron **OR** ondansetron (ZOFRAN) IV, senna, sodium chloride flush  Assessment/ Plan:  Ms. Claudia Case is a 78 y.o. white female with osteoarthritis, GERD, hyperlipidemia, hypertension, psoriatic arthritis, who was admitted to [facility] on 02/16/2019 for evaluation of severe stomatitis in response to methotrexate.  1.  Acute renal failure, possibly due to high protein content tube feeds with dehydration. 2.  Hyperkalemia  3.  Metabolic acidosis  4.  Drug reaction with severe stomatitis secondary to  methotrexate. 5. Anemia with renal failure.  6. Hypertension - most likely volume driven.   Plan: - Furosemide IV 20mg  daily - Continue sodium zirconium   LOS: 19 Alina Gilkey 7/9/20202:01 PM

## 2019-03-07 NOTE — Plan of Care (Signed)
  Problem: Education: Goal: Knowledge of General Education information will improve Description: Including pain rating scale, medication(s)/side effects and non-pharmacologic comfort measures Outcome: Progressing   Problem: Health Behavior/Discharge Planning: Goal: Ability to manage health-related needs will improve Outcome: Progressing   Problem: Clinical Measurements: Goal: Ability to maintain clinical measurements within normal limits will improve Outcome: Progressing Goal: Diagnostic test results will improve Outcome: Progressing   Problem: Safety: Goal: Ability to remain free from injury will improve Outcome: Progressing   Problem: Skin Integrity: Goal: Risk for impaired skin integrity will decrease Outcome: Progressing   

## 2019-03-07 NOTE — Consult Note (Signed)
Pharmacy Antibiotic Note  Claudia Case is a 78 y.o. female admitted on 02/16/2019 with possible aspiration pneumonia.  Pharmacy has been consulted for Zosyn dosing.  Plan: Scr/renal function improved. Will adjust dose to Zosyn 3.375g IV q8h (4 hour infusion).  Height: 5\' 4"  (162.6 cm) Weight: (unable to obtain) IBW/kg (Calculated) : 54.7  Temp (24hrs), Avg:97.4 F (36.3 C), Min:96.9 F (36.1 C), Max:97.6 F (36.4 C)  Recent Labs  Lab 03/04/19 0415 03/04/19 0817 03/05/19 0342 03/06/19 0440 03/07/19 0314  WBC  --  11.8* 10.8* 11.6* 12.7*  CREATININE 1.27* 1.38* 1.39* 1.60* 1.46*    Estimated Creatinine Clearance: 27.4 mL/min (A) (by C-G formula based on SCr of 1.46 mg/dL (H)).    No Known Allergies  Antimicrobials this admission: Zosyn 7/7 >>   Thank you for allowing pharmacy to be a part of this patient's care.  Pernell Dupre, PharmD, BCPS Clinical Pharmacist 03/07/2019 6:47 AM

## 2019-03-07 NOTE — Progress Notes (Signed)
Palliative: Claudia Case is resting quietly in bed, she will wake easily, but remains oriented to self only.  She is acutely ill and quite frail  There is no family at bedside at thsi time due to visitor restrictions.   Conference with attending, bedside nursing staff and SW related to patient condition, needs, and family dynamics.   Call to sister, Claudia Case.  We talked about Claudia Case's acute health problem, methotrexate stomatitis, this sometimes taking 2 to 8 weeks to show improvement.  We talked about giving time for healing.  I share the medical team's concern over Claudia Case's continued weakness, frailty, disorientation.   Claudia Case tells me that Claudia Case was very independent at home prior to this illness, I share that this can be a indicator about her ability to recover, but of course, does not guarantee recovery.  We talked about the possibility of hospice care if and continues to decline.  Claudia tells me that Claudia Case really wants to come home, she would never want to go to hospice home or a nursing home.  We talked about Claudia Case's extended stay in the hospital, the likelihood that she will need short-term rehab for gaining strength and balance.  I share that we understand that and would never want to live in a nursing home, but going for a short time to gain strength would allow her better recovery at home.  I encourage Claudia to keep rehab on the table, she agrees.    We make a plan for a face-to-face family meeting on Tuesday 7/14 at 10 AM at bedside.  I share this will give some time through the weekend for possible recovery.  Claudia Case shares that she will reach out to her sisters about the appointment time, and will call bedside nursing staff if time of the appointment needs to be changed.  Claudia tells me that they will keep Claudia Case at the center of decision making.   HCPOA: Claudia Case has no named HCPOA.  Durable POA has been given to sister Claudia Case. Claudia Case shared that she wanted all of her sisters to make choices for  her as a group.   Code Status discussed:  Claudia Case tells me that she would not want life support, Claudia Case also said this, Claudia Case also states DNR.  Orders changes.   Plan: Family meeting Tuesday 7/14  At bedside at 10 am. Claudia (baby at 59), Claudia Case and [place].   75 minutes, extended time  Quinn Axe, NP Palliative Medicine Team Team Phone # [phone] Greater than 50% of this time was spent counseling and coordinating care related to the above assessment and plan.

## 2019-03-07 NOTE — Progress Notes (Addendum)
[facility] at [place] NAME: Claudia Case    MR#:  [id number]  DATE OF BIRTH:  [birth year]  SUBJECTIVE:  CHIEF COMPLAINT:   Chief Complaint  Patient presents with  . Hematuria  . Emesis   Patient appears to have accidentally pulled out Dobbhoff tube last night.  This was replaced this morning by interventional radiology and tube feeds resumed.    REVIEW OF SYSTEMS:  Review of Systems  Constitutional: Negative for chills and fever.  HENT: Negative for hearing loss and tinnitus.   Eyes: Negative for blurred vision and double vision.  Respiratory: Negative for cough and shortness of breath.   Cardiovascular: Negative for chest pain and palpitations.  Gastrointestinal: Negative for heartburn and nausea.       Irritated oral mucosa  Genitourinary: Negative for dysuria and urgency.  Musculoskeletal: Negative for myalgias and neck pain.  Skin: Negative for itching and rash.  Neurological: Negative for dizziness and headaches.  Psychiatric/Behavioral: Negative for depression and hallucinations.    DRUG ALLERGIES:  No Known Allergies VITALS:  Blood pressure (!) 156/70, pulse 83, temperature 97.6 F (36.4 C), resp. rate 18, height 5\' 4"  (1.626 m), weight 59.1 kg, SpO2 93 %. PHYSICAL EXAMINATION:   Physical Exam  Constitutional: She is oriented to person, place, and time. She appears well-developed.  HENT:  Head: Normocephalic.  Right Ear: External ear normal.  Douboff tube in place.  Eyes: Pupils are equal, round, and reactive to light. Conjunctivae are normal. Right eye exhibits no discharge.  Neck: Normal range of motion. Neck supple. No thyromegaly present.  Cardiovascular: Normal rate, regular rhythm and normal heart sounds.  Respiratory: Effort normal and breath sounds normal.  GI: Soft. Bowel sounds are normal. She exhibits no distension. There is no abdominal tenderness.  Musculoskeletal: Normal range of motion.        General:  No edema.     Comments: Wrist and ankle swelling  Neurological: She is alert and oriented to person, place, and time.  Skin: Skin is warm. She is not diaphoretic. No erythema.  Psychiatric: She has a normal mood and affect. Her behavior is normal.   LABORATORY PANEL:  Female CBC Recent Labs  Lab 03/07/19 0314  WBC 12.7*  HGB 7.3*  HCT 23.6*  PLT 299   ------------------------------------------------------------------------------------------------------------------ Chemistries  Recent Labs  Lab 03/07/19 0314  NA 145  K 3.9  CL 117*  CO2 19*  GLUCOSE 172*  BUN 71*  CREATININE 1.46*  CALCIUM 8.1*  MG 2.2   RADIOLOGY:  Dg Naso G Tube Plc W/fl W/rad  Result Date: 03/07/2019 CLINICAL DATA:  Failure to thrive, patient removed previously placed Dobbhoff tube. EXAM: NASO G TUBE PLACEMENT WITH FL AND WITH RAD CONTRAST:  10 mL Omnipaque FLUOROSCOPY TIME:  Fluoroscopy Time:  24 seconds Radiation Exposure Index (if provided by the fluoroscopic device): 4.6 mGy Number of Acquired Spot Images: 0 COMPARISON:  None. FINDINGS: Real-time fluoroscopy was utilized for placement of a Dobbhoff tube. Dobbhoff tube was inserted through the left nare without difficulty. Large hiatal hernia. Dobbhoff tube coiled within the hiatal hernia. Tip of the Dobbhoff tube could not be advanced into the small bowel after multiple attempts at manipulation of the tube. The tube was left in place with the tip within hiatal hernia and confirmed with injection of 10 mL of Omnipaque 300. IMPRESSION: Dobbhoff tube placement with the tip in the stomach within a large hiatal hernia. Electronically Signed   By:  Elige KoHetal  Patel   On: 03/07/2019 10:33   ASSESSMENT AND PLAN:   1. Tachypnea with acute pulmonary edema and fluid overload.  Patient being diuresed with IV Lasix.  On empiric antibiotics with IV Zosyn to cover for probable pneumonia.  CTA chest not done due to renal failure.  VQ scan not done because patient unable to lay  flat.  Bilateral lower extremity venous Doppler ultrasound negative for DVT.  Clinical probably for PE very low.    2. Severe oral mucositis and thrush.  Continue nystatin Viscous Lidocaine and chlorhexidine. 3. Failure to thrive and severe malnutrition.: Douboff tube functioning well.  Getting nutrition via tube while awaiting healing of oral mucositis 4. Acute kidney injury and dehydration.    Being followed by nephrologist.  Felt to be likely due to dehydration.  Due to recent fluid overload patient being diuresed with Lasix. 5. Hyperkalemia.  Better after Lasix 6. Neutropenic fever on admission secondary to methotrexate.  History of psoriatic arthritis for which patient was previously on methotrexate. Finished broad-spectrum antibiotics.  Neutropenia resolved.  Remains afebrile. 7. Pancytopenia secondary to methotrexate toxicity.  Status post leucovorin and platelet transfusion.  All blood counts have improved. 8. Elevated liver function tests and jaundice.  This also has improved. 9. Hypernatremia.  This has improved. 10. Conjunctivitis start Ciloxan eyedrops Patient seen by palliative care previously and following patient.  DVT prophylaxis; SCDs Holding off on heparin products due to recent thrombocytopenia and very inflamed oral mucosa.  All the records are reviewed and case discussed with Care Management/Social Worker. Management plans discussed with the patient, family and they are in agreement. Called and updated patient's sister Ms. Lala recently on treatment plans as outlined above.  She agrees with plan of care NP : Lillia Carmelasha Dove.both patient and her 2 sisters have decided to make patient DNR going forward.  I was notified by palliative care team CODE STATUS: DNR  TOTAL TIME TAKING CARE OF THIS PATIENT: 36 minutes.   More than 50% of the time was spent in counseling/coordination of care: YES  POSSIBLE D/C IN 4 DAYS, DEPENDING ON CLINICAL CONDITION.   [name] M.D on 03/07/2019  at 3:24 PM  Between 7am to 6pm - Pager - [phone]  After 6pm go to [email or web address] - Social research officer, governmentpassword EPAS [facility]  [facility] [place] [facility]  Office  [phone]  CC: Primary care physician; Sula RumpleVirk, Charanjit, MD  Note: This dictation was prepared with Dragon dictation along with smaller phrase technology. Any transcriptional errors that result from this process are unintentional.

## 2019-03-07 NOTE — Progress Notes (Signed)
Physical Therapy Treatment Patient Details Name: Claudia Case MRN: [id number] DOB: 11-Apr-1941 Today's Date: 03/07/2019    History of Present Illness Patient is a 78 year old female that presents with weakness, mouth sores, pain in her hands, and nausea/vomiting. She was found to have pancytopenia, elevated bilirubin levels. She has a history of RA, which has been managed with methotrexate.    PT Comments    Pt pleasantly confused t/o the session, able to participate with fleeting consistency but great attitude.  She did not indicate pain with tasks, but needed consistent cuing to remain on task.  Pt's O2 off on arrival with sats in the low 80s (nursing notified) on 2 L sats quickly came up to 90s.  Pt's hemoglobin in the low 7s, deferred out of bed/mobility tasks.  Pt weak and limited with exercises despite good effort and pleasant demeanor.    Follow Up Recommendations  Supervision/Assistance - 24 hour;SNF     Equipment Recommendations  Rolling walker with 5" wheels;Wheelchair (measurements PT);Wheelchair cushion (measurements PT);3in1 (PT)    Recommendations for Other Services       Precautions / Restrictions Precautions Precautions: Fall Restrictions Weight Bearing Restrictions: No    Mobility  Bed Mobility               General bed mobility comments: mobility deferred secondary to confusion and lab/blood values  Transfers                    Ambulation/Gait                 Stairs             Wheelchair Mobility    Modified Rankin (Stroke Patients Only)       Balance                                            Cognition Arousal/Alertness: Awake/alert Behavior During Therapy: Restless Overall Cognitive Status: Impaired/Different from baseline                                 General Comments: pt somewhat confused, follows commands with cues, some difficulty sequencing/initiating tasks       Exercises General Exercises - Lower Extremity Ankle Circles/Pumps: AROM;10 reps Quad Sets: Strengthening;10 reps Heel Slides: AROM;Strengthening;10 reps Hip ABduction/ADduction: AROM;10 reps    General Comments        Pertinent Vitals/Pain Pain Assessment: No/denies pain    Home Living                      Prior Function            PT Goals (current goals can now be found in the care plan section) Progress towards PT goals: Not progressing toward goals - comment(confused, low H/H)    Frequency    Min 2X/week      PT Plan Current plan remains appropriate    Co-evaluation              AM-PAC PT "6 Clicks" Mobility   Outcome Measure  Help needed turning from your back to your side while in a flat bed without using bedrails?: A Lot Help needed moving from lying on your back to sitting on the side of a flat bed without using  bedrails?: A Lot Help needed moving to and from a bed to a chair (including a wheelchair)?: A Lot Help needed standing up from a chair using your arms (e.g., wheelchair or bedside chair)?: Total Help needed to walk in hospital room?: Total Help needed climbing 3-5 steps with a railing? : Total 6 Click Score: 9    End of Session Equipment Utilized During Treatment: Oxygen([place] out on arrival, sats in low 80s, donned 2L up to 90s) Activity Tolerance: Patient tolerated treatment well Patient left: in bed;with call bell/phone within reach;with bed alarm set Nurse Communication: Mobility status PT Visit Diagnosis: Unsteadiness on feet (R26.81);Difficulty in walking, not elsewhere classified (R26.2)     Time: [id number] PT Time Calculation (min) (ACUTE ONLY): 13 min  Charges:  $Therapeutic Exercise: 8-22 mins                     Malachi ProGalen R Aundrea Higginbotham, DPT 03/07/2019, 5:50 PM

## 2019-03-08 DIAGNOSIS — L8990 Pressure ulcer of unspecified site, unspecified stage: Secondary | ICD-10-CM | POA: Insufficient documentation

## 2019-03-08 LAB — IRON AND TIBC
Iron: 33 ug/dL (ref 28–170)
Saturation Ratios: 34 % — ABNORMAL HIGH (ref 10.4–31.8)
TIBC: 99 ug/dL — ABNORMAL LOW (ref 250–450)
UIBC: 66 ug/dL

## 2019-03-08 LAB — BASIC METABOLIC PANEL
Anion gap: 6 (ref 5–15)
BUN: 67 mg/dL — ABNORMAL HIGH (ref 8–23)
CO2: 21 mmol/L — ABNORMAL LOW (ref 22–32)
Calcium: 7.9 mg/dL — ABNORMAL LOW (ref 8.9–10.3)
Chloride: 120 mmol/L — ABNORMAL HIGH (ref 98–111)
Creatinine, Ser: 1.36 mg/dL — ABNORMAL HIGH (ref 0.44–1.00)
GFR calc Af Amer: 43 mL/min — ABNORMAL LOW (ref >60)
GFR calc non Af Amer: 37 mL/min — ABNORMAL LOW (ref >60)
Glucose, Bld: 175 mg/dL — ABNORMAL HIGH (ref 70–99)
Potassium: 3.7 mmol/L (ref 3.5–5.1)
Sodium: 147 mmol/L — ABNORMAL HIGH (ref 135–145)

## 2019-03-08 LAB — CBC
HCT: 21.8 % — ABNORMAL LOW (ref 36.0–46.0)
Hemoglobin: 6.9 g/dL — ABNORMAL LOW (ref 12.0–15.0)
MCH: 34.5 pg — ABNORMAL HIGH (ref 26.0–34.0)
MCHC: 31.7 g/dL (ref 30.0–36.0)
MCV: 109 fL — ABNORMAL HIGH (ref 80.0–100.0)
Platelets: 294 10*3/uL (ref 150–400)
RBC: 2 MIL/uL — ABNORMAL LOW (ref 3.87–5.11)
RDW: 15.5 % (ref 11.5–15.5)
WBC: 13.9 10*3/uL — ABNORMAL HIGH (ref 4.0–10.5)
nRBC: 0.1 % (ref 0.0–0.2)

## 2019-03-08 LAB — GLUCOSE, CAPILLARY
Glucose-Capillary: 130 mg/dL — ABNORMAL HIGH (ref 70–99)
Glucose-Capillary: 132 mg/dL — ABNORMAL HIGH (ref 70–99)
Glucose-Capillary: 141 mg/dL — ABNORMAL HIGH (ref 70–99)
Glucose-Capillary: 147 mg/dL — ABNORMAL HIGH (ref 70–99)
Glucose-Capillary: 151 mg/dL — ABNORMAL HIGH (ref 70–99)
Glucose-Capillary: 161 mg/dL — ABNORMAL HIGH (ref 70–99)
Glucose-Capillary: 163 mg/dL — ABNORMAL HIGH (ref 70–99)

## 2019-03-08 LAB — PHOSPHORUS: Phosphorus: 4.9 mg/dL — ABNORMAL HIGH (ref 2.5–4.6)

## 2019-03-08 LAB — PREPARE RBC (CROSSMATCH)

## 2019-03-08 LAB — FOLATE: Folate: 33 ng/mL (ref >5.9)

## 2019-03-08 LAB — MAGNESIUM: Magnesium: 2.2 mg/dL (ref 1.7–2.4)

## 2019-03-08 MED ORDER — SODIUM CHLORIDE 0.9% IV SOLUTION
Freq: Once | INTRAVENOUS | Status: AC
  Administered 2019-03-08: 12:00:00 via INTRAVENOUS

## 2019-03-08 MED ORDER — SODIUM CHLORIDE 0.9 % IV SOLN
INTRAVENOUS | Status: DC | PRN
  Administered 2019-03-08: 1000 mL via INTRAVENOUS
  Administered 2019-03-11: 500 mL via INTRAVENOUS

## 2019-03-08 MED ORDER — VITAMIN C 500 MG/5ML PO SYRP
250.0000 mg | ORAL_SOLUTION | Freq: Two times a day (BID) | ORAL | Status: DC
  Administered 2019-03-11 – 2019-03-13 (×5): 250 mg
  Filled 2019-03-08 (×11): qty 2.5

## 2019-03-08 MED ORDER — VITAMIN C 500 MG PO TABS: 250.0000 mg | ORAL_TABLET | Freq: Two times a day (BID) | ORAL | Status: DC

## 2019-03-08 MED ORDER — DEXTROSE 10 % IV SOLN
INTRAVENOUS | Status: AC
  Administered 2019-03-09: via INTRAVENOUS

## 2019-03-08 NOTE — Progress Notes (Signed)
Claudia Case called said she is the POA she wanted Dr Stark Jock  To call her at [phone]

## 2019-03-08 NOTE — TOC Progression Note (Signed)
Transition of Care [facility]) - Progression Note    Patient Details  Name: Claudia Case MRN: [id number] Date of Birth: 06-17-1941  Transition of Care [facility]) CM/SW Contact  Aletheia Tangredi, [name] Phone Number: [phone]  03/08/2019, 9:00 AM  Clinical Narrative: Disposition is undetermined at this time. Per palliative family meeting is scheduled for Tuesday 7/14 at 10 AM. Clinical Social Worker ([place]) will continue to follow and assist as needed.      Expected Discharge Plan: [place]    Expected Discharge Plan and Services Expected Discharge Plan: [place]   Discharge Planning Services: CM Consult Post Acute Care Choice: [facility] arrangements for the past 2 months: Single Family Home                                       Social Determinants of Health (SDOH) Interventions    Readmission Risk Interventions No flowsheet data found.

## 2019-03-08 NOTE — Progress Notes (Signed)
placed new consent for blood in patient file

## 2019-03-08 NOTE — Plan of Care (Signed)
  Problem: Education: Goal: Knowledge of General Education information will improve Description: Including pain rating scale, medication(s)/side effects and non-pharmacologic comfort measures Outcome: Progressing   Problem: Health Behavior/Discharge Planning: Goal: Ability to manage health-related needs will improve Outcome: Progressing   Problem: Clinical Measurements: Goal: Ability to maintain clinical measurements within normal limits will improve Outcome: Progressing Goal: Diagnostic test results will improve Outcome: Progressing   Problem: Safety: Goal: Ability to remain free from injury will improve Outcome: Progressing   Problem: Skin Integrity: Goal: Risk for impaired skin integrity will decrease Outcome: Progressing   

## 2019-03-08 NOTE — Progress Notes (Signed)
RN called RT to patient room to assist with decreased oxygen SAT. Patient SAT 80% on 3L. Patient placed on NRB mask, SATs increased to 100%. Patient has clear breath sounds throughout. Patient weaned to [facility] and then back down to 4L [place]. Patient SAT on 4L [place] 95-97%. RN at bedside and aware. Patient resting comfortably in bed at this time. Will continue to monitor.

## 2019-03-08 NOTE — Progress Notes (Signed)
[place] at [place] NAME: Claudia Case    MR#:  [id number]  DATE OF BIRTH:  11-Feb-1941  SUBJECTIVE:  CHIEF COMPLAINT:   Chief Complaint  Patient presents with  . Hematuria  . Emesis   No new complaint this morning.  No bleeding.  Noted drop in hemoglobin this morning.  REVIEW OF SYSTEMS:  Review of Systems  Constitutional: Negative for chills and fever.  HENT: Negative for hearing loss and tinnitus.   Eyes: Negative for blurred vision and double vision.  Respiratory: Negative for cough and shortness of breath.   Cardiovascular: Negative for chest pain and palpitations.  Gastrointestinal: Negative for heartburn and nausea.       Irritated oral mucosa  Genitourinary: Negative for dysuria and urgency.  Musculoskeletal: Negative for myalgias and neck pain.  Skin: Negative for itching and rash.  Neurological: Negative for dizziness and headaches.  Psychiatric/Behavioral: Negative for depression and hallucinations.    DRUG ALLERGIES:  No Known Allergies VITALS:  Blood pressure (!) 164/85, pulse 80, temperature 98.5 F (36.9 C), temperature source Oral, resp. rate 19, height 5\' 4"  (1.626 m), weight 59.1 kg, SpO2 100 %. PHYSICAL EXAMINATION:   Physical Exam  Constitutional: She is oriented to person, place, and time. She appears well-developed.  HENT:  Head: Normocephalic.  Right Ear: External ear normal.  Douboff tube in place.  Eyes: Pupils are equal, round, and reactive to light. Conjunctivae are normal. Right eye exhibits no discharge.  Neck: Normal range of motion. Neck supple. No thyromegaly present.  Cardiovascular: Normal rate, regular rhythm and normal heart sounds.  Respiratory: Effort normal and breath sounds normal.  GI: Soft. Bowel sounds are normal. She exhibits no distension. There is no abdominal tenderness.  Musculoskeletal: Normal range of motion.        General: No edema.     Comments: Wrist and ankle swelling   Neurological: She is alert and oriented to person, place, and time.  Skin: Skin is warm. She is not diaphoretic. No erythema.  Psychiatric: She has a normal mood and affect. Her behavior is normal.   LABORATORY PANEL:  Female CBC Recent Labs  Lab 03/08/19 0640  WBC 13.9*  HGB 6.9*  HCT 21.8*  PLT 294   ------------------------------------------------------------------------------------------------------------------ Chemistries  Recent Labs  Lab 03/08/19 0640  NA 147*  K 3.7  CL 120*  CO2 21*  GLUCOSE 175*  BUN 67*  CREATININE 1.36*  CALCIUM 7.9*  MG 2.2   RADIOLOGY:  No results found. ASSESSMENT AND PLAN:   1. Tachypnea with acute pulmonary edema and fluid overload.  Patient being diuresed with IV Lasix.  On empiric antibiotics with IV Zosyn to cover for probable pneumonia.  CTA chest not done due to renal failure.  VQ scan not done because patient unable to lay flat.  Bilateral lower extremity venous Doppler ultrasound negative for DVT.  Clinical probably for PE very low.    2. Severe oral mucositis and thrush.  Continue nystatin Viscous Lidocaine and chlorhexidine. 3. Failure to thrive and severe malnutrition.: Douboff tube functioning well.  Getting nutrition via tube while awaiting healing of oral mucositis 4. Acute kidney injury and dehydration.    Being followed by nephrologist.  Felt to be likely due to dehydration.  Due to recent fluid overload patient being diuresed with Lasix. 5. Hyperkalemia.  Better after Lasix 6. Neutropenic fever on admission secondary to methotrexate.  History of psoriatic arthritis for which patient was previously on  methotrexate. Finished broad-spectrum antibiotics.  Neutropenia resolved.  Remains afebrile. 7. Pancytopenia secondary to methotrexate toxicity.  Status post leucovorin and platelet transfusion.  All blood counts have improved. 8. Elevated liver function tests and jaundice.  This also has improved. 9. Hypernatremia.  This has  improved. 10. Conjunctivitis start Ciloxan eyedrops 11.  Anemia; most likely due to anemia of chronic disease. Being transfused with 1 unit of packed red blood cells today.  Requested for iron studies.  No evidence of any bleeding clinically  Patient seen by palliative care previously and following patient.  DVT prophylaxis; SCDs Holding off on heparin products due to recent thrombocytopenia and very inflamed oral mucosa.  All the records are reviewed and case discussed with Care Management/Social Worker. Management plans discussed with the patient, family and they are in agreement. I Called and updated patient's sisters Ms. Lala ([phone] )and Ms. [name] [phone]) since patient has no healthcare power of attorney at this time.  They agreed with transfusion of 1 unit of packed red blood cells. Meeting with family scheduled for Tuesday.  Palliative care following.  CODE STATUS: DNR  TOTAL TIME TAKING CARE OF THIS PATIENT: 34 minutes.   More than 50% of the time was spent in counseling/coordination of care: YES  POSSIBLE D/C IN 4 DAYS, DEPENDING ON CLINICAL CONDITION.   Leory Allinson M.D on 03/08/2019 at 2:19 PM  Between 7am to 6pm - Pager - [phone]  After 6pm go to [email or web address] - Social research officer, governmentpassword EPAS [facility]  [employer] [facility] [place] [facility]  Office  [phone]  CC: Primary care physician; Sula RumpleVirk, Charanjit, MD  Note: This dictation was prepared with Dragon dictation along with smaller phrase technology. Any transcriptional errors that result from this process are unintentional.

## 2019-03-08 NOTE — Progress Notes (Signed)
OT Cancellation Note  Patient Details Name: Claudia Case MRN: [id number] DOB: Jun 16, 1941   Cancelled Treatment:    Reason Eval/Treat Not Completed: Medical issues which prohibited therapy. Pt noted with Hgb of 6.9 this am. Will hold OT treatment and re-attempt at later date/time as medically appropriate.   Jeni Salles, MPH, MS, OTR/L ascom [phone] 03/08/19, 8:17 AM

## 2019-03-08 NOTE — Progress Notes (Signed)
Physical Therapy Treatment Patient Details Name: Claudia Case MRN: [id number] DOB: [birth year] Today's Date: 03/08/2019    History of Present Illness 78 year old female that presents with weakness, mouth sores, pain in her hands, and nausea/vomiting. She was found to have pancytopenia, elevated bilirubin levels. She has a history of RA, which has been managed with methotrexate.    PT Comments    Pt impulsive and confused t/o the session but very eager to work with therapy and try doing some mobility. She was able to do most of the bed mobility w/o assist, though she did ultimately need some assist.  Multiple sit to stand bouts with light assist but good overall effort once cued on appropriate set up and sequencing (repeated each effort as she did not retain cues).  Pt was actually able to take some guarded forward/backward steps and tolerated lightly resisted exercises well.  Pt impulsive but did surprisingly well with mobility and exercises.   Follow Up Recommendations  Supervision/Assistance - 24 hour;SNF     Equipment Recommendations  Rolling walker with 5" wheels;Wheelchair (measurements PT);Wheelchair cushion (measurements PT);3in1 (PT)    Recommendations for Other Services       Precautions / Restrictions Precautions Precautions: Fall Restrictions Weight Bearing Restrictions: No    Mobility  Bed Mobility Overal bed mobility: Needs Assistance Bed Mobility: Supine to Sit;Sit to Supine     Supine to sit: Min assist Sit to supine: Min assist;Mod assist   General bed mobility comments: Pt was able to get to EOB with only light assist, needed increased assist to get LEs back into bed  Transfers Overall transfer level: Needs assistance Equipment used: Rolling walker (2 wheeled) Transfers: Sit to/from Stand Sit to Stand: Min assist;Mod assist         General transfer comment: Elevated bed height 2-3 inches, cues for appropriate UE use, pt was able to rise with only  light assist on 3 seperate sit to stand efforts.  Each time she did need renewed cuing and consistent assist to insure she kept hips forward during transition.  Ambulation/Gait Ambulation/Gait assistance: Min assist Gait Distance (Feet): 8 Feet Assistive device: Rolling walker (2 wheeled)       General Gait Details: Pt was able to take a few EOB side steps with light assist to manipulate walker and to insure UE use/weight over walker, then managed to take a few forward and backward steps X 3 with constant cuing for safety and occasional increased need for hands-on assist to keep weight forward.     Stairs             Wheelchair Mobility    Modified Rankin (Stroke Patients Only)       Balance Overall balance assessment: Needs assistance Sitting-balance support: Feet supported Sitting balance-Leahy Scale: Good     Standing balance support: Bilateral upper extremity supported Standing balance-Leahy Scale: Fair Standing balance comment: Once pt was able to organize appropriate use of walker and keeping weight forward she was able to maintain standing relatively well (though she did have one uncontrolled sit back onto bed when she was stepping backward at EOB                            Cognition Arousal/Alertness: Awake/alert Behavior During Therapy: Anxious;Restless Overall Cognitive Status: Difficult to assess  General Comments: pt confused, follows commands with cues, some difficulty sequencing/initiating tasks and frequent off topic conversations      Exercises General Exercises - Lower Extremity Ankle Circles/Pumps: AROM;10 reps Short Arc Quad: Strengthening;10 reps Heel Slides: Strengthening;10 reps Hip ABduction/ADduction: Strengthening;10 reps Straight Leg Raises: AROM;5 reps    General Comments        Pertinent Vitals/Pain Pain Assessment: No/denies pain    Home Living                       Prior Function            PT Goals (current goals can now be found in the care plan section) Progress towards PT goals: Progressing toward goals    Frequency    Min 2X/week      PT Plan Current plan remains appropriate    Co-evaluation              AM-PAC PT "6 Clicks" Mobility   Outcome Measure  Help needed turning from your back to your side while in a flat bed without using bedrails?: A Little Help needed moving from lying on your back to sitting on the side of a flat bed without using bedrails?: A Lot Help needed moving to and from a bed to a chair (including a wheelchair)?: A Lot Help needed standing up from a chair using your arms (e.g., wheelchair or bedside chair)?: A Lot Help needed to walk in hospital room?: A Lot Help needed climbing 3-5 steps with a railing? : Total 6 Click Score: 12    End of Session Equipment Utilized During Treatment: Oxygen Activity Tolerance: Patient tolerated treatment well Patient left: with call bell/phone within reach;with bed alarm set Nurse Communication: Mobility status PT Visit Diagnosis: Unsteadiness on feet (R26.81);Difficulty in walking, not elsewhere classified (R26.2)     Time: 6222-9798 PT Time Calculation (min) (ACUTE ONLY): 24 min  Charges:  $Gait Training: 8-22 mins $Therapeutic Exercise: 8-22 mins                     Kreg Shropshire, DPT 03/08/2019, 5:17 PM

## 2019-03-08 NOTE — Consult Note (Signed)
Pharmacy Antibiotic Note  Claudia Case is a 78 y.o. female admitted on 02/16/2019 with possible aspiration pneumonia.  Pharmacy has been consulted for Zosyn dosing. Patient is on day 4 of Zosyn.  Plan: Will continue Zosyn 3.375g IV q8h (4 hour infusion).  Height: 5\' 4"  (162.6 cm) Weight: (unable to obtain) IBW/kg (Calculated) : 54.7  Temp (24hrs), Avg:98.5 F (36.9 C), Min:97.8 F (36.6 C), Max:98.9 F (37.2 C)  Recent Labs  Lab 03/04/19 0817 03/05/19 0342 03/06/19 0440 03/07/19 0314 03/08/19 0640  WBC 11.8* 10.8* 11.6* 12.7* 13.9*  CREATININE 1.38* 1.39* 1.60* 1.46* 1.36*    Estimated Creatinine Clearance: 29.4 mL/min (A) (by C-G formula based on SCr of 1.36 mg/dL (H)).    No Known Allergies  Antimicrobials this admission: Zosyn 7/7 >>   Thank you for allowing pharmacy to be a part of this patient's care.  Paulina Fusi, PharmD, BCPS 03/08/2019 3:52 PM

## 2019-03-08 NOTE — Progress Notes (Signed)
Patients Hgb 6.9 and Hct 21.8. Dr Marcille Blanco notified.

## 2019-03-08 NOTE — Progress Notes (Signed)
[facility]  ROUNDING NOTE   Subjective:   Patient states her edema is has improved.   Eating solid foods  Objective:  Vital signs in last 24 hours:  Temp:  [97.8 F (36.6 C)-98.9 F (37.2 C)] 98.5 F (36.9 C) (07/10 0946) Pulse Rate:  [78-84] 80 (07/10 0946) Resp:  [18-20] 19 (07/10 0946) BP: (159-174)/(76-85) 164/85 (07/10 0946) SpO2:  [95 %-100 %] 100 % (07/10 0946)  Weight change:  Filed Weights   02/16/19 1502 02/16/19 2158  Weight: 60.8 kg 59.1 kg    Intake/Output: I/O last 3 completed shifts: In: 628.3 [I.V.:0.1; NG/GT:490; IV Piggyback:138.2] Out: 650 [Urine:650]   Intake/Output this shift:  No intake/output data recorded.  Physical Exam: General: NAD,   Head: +stomatitis  Eyes: Anicteric, PERRL  Neck: Supple, trachea midline  Lungs:  Clear bilaterally  Heart: Regular rate and rhythm  Abdomen:  Soft, nontender,   Extremities: + peripheral edema.  Neurologic: Nonfocal, moving all four extremities  Skin: No lesions       Basic Metabolic Panel: Recent Labs  Lab 03/03/19 0551  03/04/19 0817 03/05/19 0342 03/06/19 0440 03/07/19 0314 03/08/19 0640  NA 141   < > 143 142 142 145 147*  K 5.9*   < > 5.1 5.2* 4.8 3.9 3.7  CL 120*   < > 118* 118* 118* 117* 120*  CO2 18*   < > 19* 19* 20* 19* 21*  GLUCOSE 187*   < > 135* 130* 162* 172* 175*  BUN 60*   < > 63* 65* 71* 71* 67*  CREATININE 1.28*   < > 1.38* 1.39* 1.60* 1.46* 1.36*  CALCIUM 7.5*   < > 8.2* 8.2* 7.9* 8.1* 7.9*  MG 2.2  --   --   --   --  2.2 2.2  PHOS  --   --   --   --   --  5.3* 4.9*   < > = values in this interval not displayed.    Liver Function Tests: No results for input(s): AST, ALT, ALKPHOS, BILITOT, PROT, ALBUMIN in the last 168 hours. No results for input(s): LIPASE, AMYLASE in the last 168 hours. No results for input(s): AMMONIA in the last 168 hours.  CBC: Recent Labs  Lab 03/04/19 0817 03/05/19 0342 03/06/19 0440 03/07/19 0314 03/08/19 0640  WBC 11.8*  10.8* 11.6* 12.7* 13.9*  HGB 7.9* 7.1* 7.1* 7.3* 6.9*  HCT 25.0* 23.1* 22.9* 23.6* 21.8*  MCV 111.6* 112.7* 111.2* 110.8* 109.0*  PLT 308 272 290 299 294    Cardiac Enzymes: No results for input(s): CKTOTAL, CKMB, CKMBINDEX, TROPONINI in the last 168 hours.  BNP: Invalid input(s): POCBNP  CBG: Recent Labs  Lab 03/07/19 1732 03/07/19 1952 03/08/19 0007 03/08/19 0419 03/08/19 0805  GLUCAP 126* 132* 147* 48* 73*    Microbiology: Results for orders placed or performed during the hospital encounter of 02/16/19  Culture, blood (Routine x 2)     Status: None   Collection Time: 02/16/19  3:29 PM   Specimen: BLOOD  Result Value Ref Range Status   Specimen Description BLOOD LEFT ANTECUBITAL  Final   Special Requests   Final    BOTTLES DRAWN AEROBIC AND ANAEROBIC Blood Culture results may not be optimal due to an excessive volume of blood received in culture bottles   Culture   Final    NO GROWTH 5 DAYS Performed at [facility], [address], [place], [place] [address]    Report Status 02/25/2019  FINAL  Final  Culture, blood (Routine x 2)     Status: Abnormal   Collection Time: 02/16/19  4:40 PM   Specimen: BLOOD  Result Value Ref Range Status   Specimen Description   Final    BLOOD RIGHT ANTECUBITAL Performed at [facility], [address] [place], [place] [address]    Special Requests   Final    BOTTLES DRAWN AEROBIC AND ANAEROBIC Blood Culture adequate volume Performed at [facility], [address] [place], [place] [address]    Culture  Setup Time   Final    GRAM POSITIVE COCCI ANAEROBIC BOTTLE ONLY CRITICAL RESULT CALLED TO, READ BACK BY AND VERIFIED WITH: Crist FatHANNAH WANG ON 02/17/2019 AT 1051 QSD    Culture (A)  Final    STAPHYLOCOCCUS SPECIES (COAGULASE NEGATIVE) THE SIGNIFICANCE OF ISOLATING THIS ORGANISM FROM A SINGLE SET OF BLOOD CULTURES WHEN MULTIPLE SETS ARE DRAWN IS UNCERTAIN. PLEASE NOTIFY THE MICROBIOLOGY DEPARTMENT WITHIN  ONE WEEK IF SPECIATION AND SENSITIVITIES ARE REQUIRED. Performed at [facility], [address] [place], [place] [address]    Report Status 02/19/2019 FINAL  Final  Urine culture     Status: None   Collection Time: 02/16/19  4:40 PM   Specimen: Urine, Random  Result Value Ref Range Status   Specimen Description   Final    URINE, RANDOM Performed at [facility], [address] [place], [place] [address]    Special Requests   Final    NONE Performed at [facility], [address] [place], [place] [address]    Culture   Final    NO GROWTH Performed at [facility], [address] [place], [place] [address]    Report Status 02/18/2019 FINAL  Final  Blood Culture ID Panel (Reflexed)     Status: Abnormal   Collection Time: 02/16/19  4:40 PM  Result Value Ref Range Status   Enterococcus species NOT DETECTED NOT DETECTED Final   Listeria monocytogenes NOT DETECTED NOT DETECTED Final   Staphylococcus species DETECTED (A) NOT DETECTED Final    Comment: Methicillin (oxacillin) susceptible coagulase negative staphylococcus. Possible blood culture contaminant (unless isolated from more than one blood culture draw or clinical case suggests pathogenicity). No antibiotic treatment is indicated for blood  culture contaminants. CRITICAL RESULT CALLED TO, READ BACK BY AND VERIFIED WITH: Crist FatHANNAH WANG ON 02/17/2019 AT 1051 QSD    Staphylococcus aureus (BCID) NOT DETECTED NOT DETECTED Final   Methicillin resistance NOT DETECTED NOT DETECTED Final   Streptococcus species NOT DETECTED NOT DETECTED Final   Streptococcus agalactiae NOT DETECTED NOT DETECTED Final   Streptococcus pneumoniae NOT DETECTED NOT DETECTED Final   Streptococcus pyogenes NOT DETECTED NOT DETECTED Final   Acinetobacter baumannii NOT DETECTED NOT DETECTED Final   Enterobacteriaceae species NOT DETECTED NOT DETECTED Final   Enterobacter cloacae complex NOT DETECTED NOT DETECTED Final    Escherichia coli NOT DETECTED NOT DETECTED Final   Klebsiella oxytoca NOT DETECTED NOT DETECTED Final   Klebsiella pneumoniae NOT DETECTED NOT DETECTED Final   Proteus species NOT DETECTED NOT DETECTED Final   Serratia marcescens NOT DETECTED NOT DETECTED Final   Haemophilus influenzae NOT DETECTED NOT DETECTED Final   Neisseria meningitidis NOT DETECTED NOT DETECTED Final   Pseudomonas aeruginosa NOT DETECTED NOT DETECTED Final   Candida albicans NOT DETECTED NOT DETECTED Final   Candida glabrata NOT DETECTED NOT DETECTED Final   Candida krusei NOT DETECTED NOT DETECTED Final  Candida parapsilosis NOT DETECTED NOT DETECTED Final   Candida tropicalis NOT DETECTED NOT DETECTED Final    Comment: Performed at [facility], [address] [place], [place] [address]  SARS Coronavirus 2 (CEPHEID- Performed in [facility] hospital lab), Hosp Order     Status: None   Collection Time: 02/16/19  4:42 PM   Specimen: Nasopharyngeal Swab  Result Value Ref Range Status   SARS Coronavirus 2 NEGATIVE NEGATIVE Final    Comment: (NOTE) If result is NEGATIVE SARS-CoV-2 target nucleic acids are NOT DETECTED. The SARS-CoV-2 RNA is generally detectable in upper and lower  respiratory specimens during the acute phase of infection. The lowest  concentration of SARS-CoV-2 viral copies this assay can detect is 250  copies / mL. A negative result does not preclude SARS-CoV-2 infection  and should not be used as the sole basis for treatment or other  patient management decisions.  A negative result may occur with  improper specimen collection / handling, submission of specimen other  than nasopharyngeal swab, presence of viral mutation(s) within the  areas targeted by this assay, and inadequate number of viral copies  (<250 copies / mL). A negative result must be combined with clinical  observations, patient history, and epidemiological information. If result is POSITIVE SARS-CoV-2 target nucleic  acids are DETECTED. The SARS-CoV-2 RNA is generally detectable in upper and lower  respiratory specimens dur ing the acute phase of infection.  Positive  results are indicative of active infection with SARS-CoV-2.  Clinical  correlation with patient history and other diagnostic information is  necessary to determine patient infection status.  Positive results do  not rule out bacterial infection or co-infection with other viruses. If result is PRESUMPTIVE POSTIVE SARS-CoV-2 nucleic acids MAY BE PRESENT.   A presumptive positive result was obtained on the submitted specimen  and confirmed on repeat testing.  While 2019 novel coronavirus  (SARS-CoV-2) nucleic acids may be present in the submitted sample  additional confirmatory testing may be necessary for epidemiological  and / or clinical management purposes  to differentiate between  SARS-CoV-2 and other Sarbecovirus currently known to infect humans.  If clinically indicated additional testing with an alternate test  methodology [phone] is advised. The SARS-CoV-2 RNA is generally  detectable in upper and lower respiratory sp ecimens during the acute  phase of infection. The expected result is Negative. Fact Sheet for Patients:  BoilerBrush.com.cyhttps://www.fda.gov/media/136312/download Fact Sheet for Healthcare Providers: https://pope.com/https://www.fda.gov/media/136313/download This test is not yet approved or cleared by the [place] FDA and has been authorized for detection and/or diagnosis of SARS-CoV-2 by FDA under an Emergency Use Authorization (EUA).  This EUA will remain in effect (meaning this test can be used) for the duration of the COVID-19 declaration under Section 564(b)(1) of the Act, 21 U.S.C. section 360bbb-3(b)(1), unless the authorization is terminated or revoked sooner. Performed at [facility], [address] [place], [place] [address]   Culture, blood (single) w Reflex to ID Panel     Status: None   Collection Time:  02/18/19 12:14 PM   Specimen: BLOOD  Result Value Ref Range Status   Specimen Description BLOOD BLOOD LEFT ARM  Final   Special Requests   Final    BOTTLES DRAWN AEROBIC AND ANAEROBIC Blood Culture results may not be optimal due to an excessive volume of blood received in culture bottles   Culture   Final    NO GROWTH 5 DAYS Performed at [facility], [address]  [place], [place] [address]    Report Status 02/23/2019 FINAL  Final    Coagulation Studies: No results for input(s): LABPROT, INR in the last 72 hours.  Urinalysis: No results for input(s): COLORURINE, LABSPEC, PHURINE, GLUCOSEU, HGBUR, BILIRUBINUR, KETONESUR, PROTEINUR, UROBILINOGEN, NITRITE, LEUKOCYTESUR in the last 72 hours.  Invalid input(s): APPERANCEUR    Imaging: Dg Naso G Tube Plc W/fl W/rad  Result Date: 03/07/2019 CLINICAL DATA:  Failure to thrive, patient removed previously placed Dobbhoff tube. EXAM: NASO G TUBE PLACEMENT WITH FL AND WITH RAD CONTRAST:  10 mL Omnipaque FLUOROSCOPY TIME:  Fluoroscopy Time:  24 seconds Radiation Exposure Index (if provided by the fluoroscopic device): 4.6 mGy Number of Acquired Spot Images: 0 COMPARISON:  None. FINDINGS: Real-time fluoroscopy was utilized for placement of a Dobbhoff tube. Dobbhoff tube was inserted through the left nare without difficulty. Large hiatal hernia. Dobbhoff tube coiled within the hiatal hernia. Tip of the Dobbhoff tube could not be advanced into the small bowel after multiple attempts at manipulation of the tube. The tube was left in place with the tip within hiatal hernia and confirmed with injection of 10 mL of Omnipaque 300. IMPRESSION: Dobbhoff tube placement with the tip in the stomach within a large hiatal hernia. Electronically Signed   By: Elige KoHetal  Patel   On: 03/07/2019 10:33     Medications:   . sodium chloride Stopped (03/08/19 0524)  . piperacillin-tazobactam (ZOSYN)  IV 12.5 mL/hr at 03/08/19 0600   . B-complex with vitamin C  1  tablet Oral Daily  . bisacodyl  10 mg Rectal Daily  . ciprofloxacin  1 drop Both Eyes Q4H while awake  . feeding supplement (NEPRO CARB STEADY)  1,000 mL Per Tube Q24H  . folic acid  1 mg Oral Daily  . free water  30 mL Per Tube Q4H  . furosemide  20 mg Intravenous Daily  . miconazole   Topical Daily  . multivitamin with minerals  1 tablet Oral Daily  . nystatin   Topical BID  . pantoprazole (PROTONIX) IV  40 mg Intravenous Q12H  . tamsulosin  0.4 mg Oral Daily  . thiamine  100 mg Oral Daily  . vitamin C  250 mg Oral BID   sodium chloride, acetaminophen **OR** acetaminophen, acetaminophen, iopamidol, lidocaine, magic mouthwash **AND** lidocaine, lip balm, ondansetron **OR** ondansetron (ZOFRAN) IV, senna, sodium chloride flush  Assessment/ Plan:  Claudia Case is a 78 y.o. white female with osteoarthritis, GERD, hyperlipidemia, hypertension, psoriatic arthritis, who was [facility] to [facility]16/2019 for evaluation of severe stomatitis in response to methotrexate.  1.  Acute renal failure, possibly due to high protein content tube feeds with dehydration. 2.  Hyperkalemia  3.  Metabolic acidosis  4.  Drug reaction with severe stomatitis secondary to methotrexate. 5. Anemia with renal failure.  6. Hypertension - most likely volume driven.   Plan: - Furosemide IV 20mg  daily. Evaluate for PO furosemie tomorrow.  - Discontinue sodium zirconium   LOS: 20 Canio Win[place]ur 7/10/20201:09 PM

## 2019-03-08 NOTE — Care Management Important Message (Signed)
Important Message  Patient Details  Name: Claudia Case MRN: [id number] Date of Birth: 04-13-1941   Medicare Important Message Given:  Yes     [name] 03/08/2019, 10:52 AM

## 2019-03-09 LAB — BPAM RBC
Blood Product Expiration Date: 202007292359
ISSUE DATE / TIME: 202007101656
Unit Type and Rh: 600

## 2019-03-09 LAB — TYPE AND SCREEN
ABO/RH(D): A POS
Antibody Screen: NEGATIVE
Unit division: 0

## 2019-03-09 LAB — BASIC METABOLIC PANEL
Anion gap: 6 (ref 5–15)
BUN: 62 mg/dL — ABNORMAL HIGH (ref 8–23)
CO2: 23 mmol/L (ref 22–32)
Calcium: 7.9 mg/dL — ABNORMAL LOW (ref 8.9–10.3)
Chloride: 120 mmol/L — ABNORMAL HIGH (ref 98–111)
Creatinine, Ser: 1.35 mg/dL — ABNORMAL HIGH (ref 0.44–1.00)
GFR calc Af Amer: 43 mL/min — ABNORMAL LOW (ref >60)
GFR calc non Af Amer: 38 mL/min — ABNORMAL LOW (ref >60)
Glucose, Bld: 136 mg/dL — ABNORMAL HIGH (ref 70–99)
Potassium: 3.2 mmol/L — ABNORMAL LOW (ref 3.5–5.1)
Sodium: 149 mmol/L — ABNORMAL HIGH (ref 135–145)

## 2019-03-09 LAB — CBC
HCT: 24.8 % — ABNORMAL LOW (ref 36.0–46.0)
Hemoglobin: 8 g/dL — ABNORMAL LOW (ref 12.0–15.0)
MCH: 32.8 pg (ref 26.0–34.0)
MCHC: 32.3 g/dL (ref 30.0–36.0)
MCV: 101.6 fL — ABNORMAL HIGH (ref 80.0–100.0)
Platelets: 259 10*3/uL (ref 150–400)
RBC: 2.44 MIL/uL — ABNORMAL LOW (ref 3.87–5.11)
RDW: 21.2 % — ABNORMAL HIGH (ref 11.5–15.5)
WBC: 13.7 10*3/uL — ABNORMAL HIGH (ref 4.0–10.5)
nRBC: 0.3 % — ABNORMAL HIGH (ref 0.0–0.2)

## 2019-03-09 LAB — GLUCOSE, CAPILLARY
Glucose-Capillary: 129 mg/dL — ABNORMAL HIGH (ref 70–99)
Glucose-Capillary: 135 mg/dL — ABNORMAL HIGH (ref 70–99)
Glucose-Capillary: 127 mg/dL — ABNORMAL HIGH (ref 70–99)
Glucose-Capillary: 144 mg/dL — ABNORMAL HIGH (ref 70–99)
Glucose-Capillary: 123 mg/dL — ABNORMAL HIGH (ref 70–99)

## 2019-03-09 LAB — VITAMIN B12: Vitamin B-12: 1351 pg/mL — ABNORMAL HIGH (ref 180–914)

## 2019-03-09 LAB — MAGNESIUM: Magnesium: 2.2 mg/dL (ref 1.7–2.4)

## 2019-03-09 MED ORDER — SODIUM CHLORIDE 0.9% FLUSH: 10.0000 mL | INTRAVENOUS | Status: DC | PRN

## 2019-03-09 MED ORDER — DEXTROSE 10 % IV SOLN
INTRAVENOUS | Status: DC
  Administered 2019-03-09: 13:00:00 via INTRAVENOUS

## 2019-03-09 MED ORDER — ENOXAPARIN SODIUM 30 MG/0.3ML ~~LOC~~ SOLN
30.0000 mg | SUBCUTANEOUS | Status: DC
  Administered 2019-03-09: 30 mg via SUBCUTANEOUS
  Filled 2019-03-09: qty 0.3

## 2019-03-09 MED ORDER — ENOXAPARIN SODIUM 40 MG/0.4ML ~~LOC~~ SOLN
40.0000 mg | SUBCUTANEOUS | Status: DC
  Administered 2019-03-10 – 2019-03-12 (×3): 40 mg via SUBCUTANEOUS
  Filled 2019-03-09 (×2): qty 0.4

## 2019-03-09 MED ORDER — DEXTROSE 5 % IV SOLN
INTRAVENOUS | Status: DC
  Administered 2019-03-09: 14:00:00 via INTRAVENOUS

## 2019-03-09 NOTE — Evaluation (Signed)
Occupational Therapy Re-Evaluation Patient Details Name: Claudia Case MRN: [id number] DOB: 04/21/1941 Today's Date: 03/09/2019    History of Present Illness 78 year old female that presents with weakness, mouth sores, pain in her hands, and nausea/vomiting. She was found to have pancytopenia, elevated bilirubin levels. She has a history of RA, which has been managed with methotrexate.   Clinical Impression   Pt seen for OT re-evaluation this date. Prior to hospital admission, pt was generally independent in ADLs/IADLs.  Pt lives with her sister in a 1 level home with a level entry.  Currently pt demonstrates impairments in cognition, orientation, activity tolerance, and functional UE use requiring max assist for ADLs primarily at bed level. Pt appears to have experienced some decline in cognitive and functional status since time of initial OT evaluation. Goals downgraded to reflect current pt status in POC. Pt continues to benefit from skilled OT services to address noted impairments and functional limitations (see below for any additional details) in order to maximize safety and independence while minimizing falls risk and caregiver burden. POC updated. Frequency updated.  Upon hospital discharge, continue to recommend STR.      Follow Up Recommendations  SNF    Equipment Recommendations  3 in 1 bedside commode    Recommendations for Other Services       Precautions / Restrictions Precautions Precautions: Fall Precaution Comments: Monitor HgB and HCT levels Restrictions Weight Bearing Restrictions: No      Mobility Bed Mobility Overal bed mobility: Needs Assistance Bed Mobility: Rolling Rolling: Max assist         General bed mobility comments: Pt congnition continues to limit participation in mobility. Unable to follow VCs at time of OT re-eval. Limited engagement in bed mobility on this date. Per RN pt has required max assist for bed mobility most recently.  Transfers                 General transfer comment: Deferred for pt safety.    Balance Overall balance assessment: Needs assistance                                         ADL either performed or assessed with clinical judgement   ADL Overall ADL's : Needs assistance/impaired Eating/Feeding: Cueing for sequencing;Cueing for safety;Set up;Bed level   Grooming: Bed level;Set up;Minimal assistance;Moderate assistance;Cueing for safety;Cueing for sequencing   Upper Body Bathing: Maximal assistance;Bed level   Lower Body Bathing: Maximal assistance;Bed level   Upper Body Dressing : Bed level;Moderate assistance;Cueing for sequencing   Lower Body Dressing: Maximal assistance;Bed level     Toilet Transfer Details (indicate cue type and reason): Bed level for toileting at this time. Pt very limited by cognitive status. Toileting- Clothing Manipulation and Hygiene: Maximal assistance;Bed level               Vision Baseline Vision/History: No visual deficits Patient Visual Report: No change from baseline       Perception     Praxis      Pertinent Vitals/Pain Pain Assessment: No/denies pain     Hand Dominance Right   Extremity/Trunk Assessment Upper Extremity Assessment Upper Extremity Assessment: Generalized weakness(Arthritic changes in BUEs)   Lower Extremity Assessment Lower Extremity Assessment: Generalized weakness       Communication Communication Communication: No difficulties   Cognition Arousal/Alertness: Awake/alert Behavior During Therapy: Anxious;Restless Overall Cognitive Status: Impaired/Different from  baseline Area of Impairment: Orientation;Following commands;Memory                 Orientation Level: Person;Place;Time;Situation   Memory: Decreased short-term memory Following Commands: Follows one step commands inconsistently;Follows one step commands with increased time       General Comments: Pt significantly confused,  unable to provide name/DOB accurately. Speaking about people not in the room. Unaware she is in the hospital.   General Comments  BUE red and edematous with scabs and flaking skin. Pt also noted to have dry blood around lips and mouth with noted scabbing. RN aware.    Exercises Other Exercises Other Exercises: with set up and cues, ultimately requiring Min A for thoroughness, pt washed face   Shoulder Instructions      Home Living Family/patient expects to be discharged to:: Private residence Living Arrangements: Other relatives(Sister) Available Help at Discharge: Available PRN/intermittently Type of Home: House Home Access: Level entry     Home Layout: One level               Home Equipment: None   Additional Comments: Per chart, pt reported independence with ADLs/IADLs, light meal prep, med mgt prior to admission. Was not driving.      Prior Functioning/Environment Level of Independence: Independent        Comments: Per patient, one near fall while doing housework.        OT Problem List: Decreased strength;Impaired UE functional use;Decreased cognition;Decreased safety awareness;Decreased activity tolerance;Impaired balance (sitting and/or standing)      OT Treatment/Interventions: Self-care/ADL training;Therapeutic exercise;DME and/or AE instruction;Therapeutic activities;Patient/family education;Energy conservation;Cognitive remediation/compensation    OT Goals(Current goals can be found in the care plan section) Acute Rehab OT Goals Patient Stated Goal: To return home OT Goal Formulation: With patient Time For Goal Achievement: 03/23/19 Potential to Achieve Goals: Fair ADL Goals Pt Will Perform Grooming: with min assist;sitting(With LRAD for improved safety and functional independence.) Pt Will Perform Upper Body Dressing: with min assist;with mod assist;sitting(With LRAD for improved safety and functional independence.) Pt Will Perform Lower Body  Dressing: with mod assist;with adaptive equipment;sit to/from stand;with min assist(With LRAD for improved safety and functional independence.)  OT Frequency: Min 1X/week   Barriers to D/C: Decreased caregiver support          Co-evaluation              AM-PAC OT "6 Clicks" Daily Activity     Outcome Measure Help from another person eating meals?: A Little Help from another person taking care of personal grooming?: A Lot Help from another person toileting, which includes using toliet, bedpan, or urinal?: A Lot Help from another person bathing (including washing, rinsing, drying)?: A Lot Help from another person to put on and taking off regular upper body clothing?: A Lot Help from another person to put on and taking off regular lower body clothing?: A Lot 6 Click Score: 13   End of Session    Activity Tolerance: Patient tolerated treatment well Patient left: in bed;with call bell/phone within reach;with bed alarm set;with nursing/sitter in room  OT Visit Diagnosis: Muscle weakness (generalized) (M62.81)                Time: 4098-11911304-1324 OT Time Calculation (min): 20 min Charges:  OT Evaluation $OT Re-eval: 1 Re-eval OT Treatments $Self Care/Home Management : 8-22 mins  Rockney GheeSerenity Violanda Bobeck, M.S., OTR/L Ascom: [phone] 03/09/19, 3:55 PM

## 2019-03-09 NOTE — Progress Notes (Signed)
[facility] NAME: Claudia Case    MR#:  [id number]  DATE OF BIRTH:  Jan 31, 1941  SUBJECTIVE:  CHIEF COMPLAINT:   Chief Complaint  Patient presents with  . Hematuria  . Emesis   Sitting up.  Poor appetite.  Tolerated some liquids.  Confused.  Pulled NG tube and midline out.  REVIEW OF SYSTEMS:  Review of Systems  Constitutional: Negative for chills and fever.  HENT: Negative for hearing loss and tinnitus.   Eyes: Negative for blurred vision and double vision.  Respiratory: Negative for cough and shortness of breath.   Cardiovascular: Negative for chest pain and palpitations.  Gastrointestinal: Negative for heartburn and nausea.       Inflammed oral mucosa  Genitourinary: Negative for dysuria and urgency.  Musculoskeletal: Negative for myalgias and neck pain.  Skin: Negative for itching and rash.  Neurological: Negative for dizziness and headaches.  Psychiatric/Behavioral: Negative for depression and hallucinations.    DRUG ALLERGIES:  No Known Allergies VITALS:  Blood pressure (!) 155/83, pulse 70, temperature (!) 97 F (36.1 C), temperature source Axillary, resp. rate 19, height 5\' 4"  (1.626 m), weight 59.1 kg, SpO2 99 %. PHYSICAL EXAMINATION:   Physical Exam  HENT:  Head: Normocephalic.  Right Ear: External ear normal.  Inflammed oral mucosa  Eyes: Pupils are equal, round, and reactive to light. Conjunctivae are normal. Right eye exhibits no discharge.  Neck: Normal range of motion. Neck supple. No thyromegaly present.  Cardiovascular: Normal rate, regular rhythm and normal heart sounds.  Respiratory: Effort normal and breath sounds normal.  GI: Soft. Bowel sounds are normal. She exhibits no distension. There is no abdominal tenderness.  Musculoskeletal: Normal range of motion.        General: No edema.     Comments: Wrist and ankle swelling  Neurological:  confused  Skin: Skin is warm. She is not diaphoretic.  No erythema.  Psychiatric: She has a normal mood and affect. Her behavior is normal.   LABORATORY PANEL:  Female CBC Recent Labs  Lab 03/09/19 0349  WBC 13.7*  HGB 8.0*  HCT 24.8*  PLT 259   ------------------------------------------------------------------------------------------------------------------ Chemistries  Recent Labs  Lab 03/09/19 0349  NA 149*  K 3.2*  CL 120*  CO2 23  GLUCOSE 136*  BUN 62*  CREATININE 1.35*  CALCIUM 7.9*  MG 2.2   RADIOLOGY:  No results found. ASSESSMENT AND PLAN:   * Tachypnea with acute pulmonary edema and fluid overload.  Patient being diuresed with IV Lasix.  On empiric antibiotics with IV Zosyn to cover for probable pneumonia.  CTA chest not done due to renal failure.  VQ scan not done because patient unable to lay flat.  Bilateral lower extremity venous Doppler ultrasound negative for DVT.  Clinical probably for PE very low.    * Severe oral mucositis and thrush.  Continue nystatin, Viscous Lidocaine and chlorhexidine.  * Failure to thrive and severe malnutrition.:  She was getting tube feeds through [facility].  Unfortunately she has pulled this out.  Will need to consult IR to replace on Monday.  * Acute kidney injury and dehydration.    Being followed by nephrologist.  Felt to be likely due to dehydration.  Due to recent fluid overload patient being diuresed with Lasix.  * Hyperkalemia.  Better after Lasix  * Neutropenic fever on admission secondary to methotrexate.  History of psoriatic arthritis for which patient was previously on methotrexate. Finished broad-spectrum antibiotics.  Neutropenia resolved.  Remains afebrile.  * Pancytopenia secondary to methotrexate toxicity.  Status post leucovorin and platelet transfusion.improved.  *Elevated liver function tests and jaundice.  This also has improved.  * Hypernatremia.  This has improved.  * Conjunctivitis started Ciloxan eyedrops  * Anemia; most likely due to anemia of  chronic disease. 1 unit packed RBCs transfused on 03/08/2019  *DVT prophylaxis with Lovenox  CODE STATUS: DNR  TOTAL TIME TAKING CARE OF THIS PATIENT: 34 minutes.   POSSIBLE D/C IN 4-5 DAYS, DEPENDING ON CLINICAL CONDITION.  Molinda BailiffSrikar R Emary Zalar M.D on 03/09/2019 at 3:00 PM  Between 7am to 6pm - Pager - [phone]  After 6pm go to [email or web address] - Social research officer, governmentpassword [email or web address] [facility]  [facility]  Office  [phone]  CC: Primary care physician; Sula RumpleVirk, Charanjit, MD  Note: This dictation was prepared with Dragon dictation along with smaller phrase technology. Any transcriptional errors that result from this process are unintentional.

## 2019-03-09 NOTE — Progress Notes (Signed)
[facility]  ROUNDING NOTE   Subjective:   Pulled out NGT tube and removed her midline.   Alert to self.   Objective:  Vital signs in last 24 hours:  Temp:  [97 F (36.1 C)-98.3 F (36.8 C)] 97 F (36.1 C) (07/11 1051) Pulse Rate:  [70-88] 70 (07/11 1051) Resp:  [16-19] 19 (07/11 0414) BP: (155-190)/(73-83) 155/83 (07/11 1051) SpO2:  [96 %-100 %] 99 % (07/11 1051)  Weight change:  Filed Weights   02/16/19 1502 02/16/19 2158  Weight: 60.8 kg 59.1 kg    Intake/Output: I/O last 3 completed shifts: In: 735.2 [I.V.:55.7; NG/GT:490; IV Piggyback:189.4] Out: 800 [Urine:800]   Intake/Output this shift:  No intake/output data recorded.  Physical Exam: General: NAD,   Head: +stomatitis  Eyes: Anicteric, PERRL  Neck: Supple, trachea midline  Lungs:  Clear bilaterally  Heart: Regular rate and rhythm  Abdomen:  Soft, nontender,   Extremities: + peripheral edema.  Neurologic: Nonfocal, moving all four extremities  Skin: No lesions       Basic Metabolic Panel: Recent Labs  Lab 03/03/19 0551  03/05/19 0342 03/06/19 0440 03/07/19 0314 03/08/19 0640 03/09/19 0349  NA 141   < > 142 142 145 147* 149*  K 5.9*   < > 5.2* 4.8 3.9 3.7 3.2*  CL 120*   < > 118* 118* 117* 120* 120*  CO2 18*   < > 19* 20* 19* 21* 23  GLUCOSE 187*   < > 130* 162* 172* 175* 136*  BUN 60*   < > 65* 71* 71* 67* 62*  CREATININE 1.28*   < > 1.39* 1.60* 1.46* 1.36* 1.35*  CALCIUM 7.5*   < > 8.2* 7.9* 8.1* 7.9* 7.9*  MG 2.2  --   --   --  2.2 2.2 2.2  PHOS  --   --   --   --  5.3* 4.9*  --    < > = values in this interval not displayed.    Liver Function Tests: No results for input(s): AST, ALT, ALKPHOS, BILITOT, PROT, ALBUMIN in the last 168 hours. No results for input(s): LIPASE, AMYLASE in the last 168 hours. No results for input(s): AMMONIA in the last 168 hours.  CBC: Recent Labs  Lab 03/05/19 0342 03/06/19 0440 03/07/19 0314 03/08/19 0640 03/09/19 0349  WBC 10.8*  11.6* 12.7* 13.9* 13.7*  HGB 7.1* 7.1* 7.3* 6.9* 8.0*  HCT 23.1* 22.9* 23.6* 21.8* 24.8*  MCV 112.7* 111.2* 110.8* 109.0* 101.6*  PLT 272 290 299 294 259    Cardiac Enzymes: No results for input(s): CKTOTAL, CKMB, CKMBINDEX, TROPONINI in the last 168 hours.  BNP: Invalid input(s): POCBNP  CBG: Recent Labs  Lab 03/08/19 2037 03/08/19 2352 03/09/19 0608 03/09/19 0751 03/09/19 1206  GLUCAP 132* 130* 129* 135* 127*    Microbiology: Results for orders placed or performed during the hospital encounter of 02/16/19  Culture, blood (Routine x 2)     Status: None   Collection Time: 02/16/19  3:29 PM   Specimen: BLOOD  Result Value Ref Range Status   Specimen Description BLOOD LEFT ANTECUBITAL  Final   Special Requests   Final    BOTTLES DRAWN AEROBIC AND ANAEROBIC Blood Culture results may not be optimal due to an excessive volume of blood received in culture bottles   Culture   Final    NO GROWTH 5 DAYS Performed at [facility], [address] [place], [place] [address]    Report Status 02/25/2019  FINAL  Final  Culture, blood (Routine x 2)     Status: Abnormal   Collection Time: 02/16/19  4:40 PM   Specimen: BLOOD  Result Value Ref Range Status   Specimen Description   Final    BLOOD RIGHT ANTECUBITAL Performed at [facility], [address] [place], [place] [address]    Special Requests   Final    BOTTLES DRAWN AEROBIC AND ANAEROBIC Blood Culture adequate volume Performed at [facility], [address] [place], [place] [address]    Culture  Setup Time   Final    GRAM POSITIVE COCCI ANAEROBIC BOTTLE ONLY CRITICAL RESULT CALLED TO, READ BACK BY AND VERIFIED WITH: Crist FatHANNAH WANG ON 02/17/2019 AT 1051 QSD    Culture (A)  Final    STAPHYLOCOCCUS SPECIES (COAGULASE NEGATIVE) THE SIGNIFICANCE OF ISOLATING THIS ORGANISM FROM A SINGLE SET OF BLOOD CULTURES WHEN MULTIPLE SETS ARE DRAWN IS UNCERTAIN. PLEASE NOTIFY THE MICROBIOLOGY DEPARTMENT WITHIN  ONE WEEK IF SPECIATION AND SENSITIVITIES ARE REQUIRED. Performed at [facility], [address] [place], [place] [address]    Report Status 02/19/2019 FINAL  Final  Urine culture     Status: None   Collection Time: 02/16/19  4:40 PM   Specimen: Urine, Random  Result Value Ref Range Status   Specimen Description   Final    URINE, RANDOM Performed at [facility], [address] [place], [place] [address]    Special Requests   Final    NONE Performed at [facility], [address] [place], [place] [address]    Culture   Final    NO GROWTH Performed at [facility], [address] [place], [place] [address]    Report Status 02/18/2019 FINAL  Final  Blood Culture ID Panel (Reflexed)     Status: Abnormal   Collection Time: 02/16/19  4:40 PM  Result Value Ref Range Status   Enterococcus species NOT DETECTED NOT DETECTED Final   Listeria monocytogenes NOT DETECTED NOT DETECTED Final   Staphylococcus species DETECTED (A) NOT DETECTED Final    Comment: Methicillin (oxacillin) susceptible coagulase negative staphylococcus. Possible blood culture contaminant (unless isolated from more than one blood culture draw or clinical case suggests pathogenicity). No antibiotic treatment is indicated for blood  culture contaminants. CRITICAL RESULT CALLED TO, READ BACK BY AND VERIFIED WITH: Crist FatHANNAH WANG ON 02/17/2019 AT 1051 QSD    Staphylococcus aureus (BCID) NOT DETECTED NOT DETECTED Final   Methicillin resistance NOT DETECTED NOT DETECTED Final   Streptococcus species NOT DETECTED NOT DETECTED Final   Streptococcus agalactiae NOT DETECTED NOT DETECTED Final   Streptococcus pneumoniae NOT DETECTED NOT DETECTED Final   Streptococcus pyogenes NOT DETECTED NOT DETECTED Final   Acinetobacter baumannii NOT DETECTED NOT DETECTED Final   Enterobacteriaceae species NOT DETECTED NOT DETECTED Final   Enterobacter cloacae complex NOT DETECTED NOT DETECTED Final    Escherichia coli NOT DETECTED NOT DETECTED Final   Klebsiella oxytoca NOT DETECTED NOT DETECTED Final   Klebsiella pneumoniae NOT DETECTED NOT DETECTED Final   Proteus species NOT DETECTED NOT DETECTED Final   Serratia marcescens NOT DETECTED NOT DETECTED Final   Haemophilus influenzae NOT DETECTED NOT DETECTED Final   Neisseria meningitidis NOT DETECTED NOT DETECTED Final   Pseudomonas aeruginosa NOT DETECTED NOT DETECTED Final   Candida albicans NOT DETECTED NOT DETECTED Final   Candida glabrata NOT DETECTED NOT DETECTED Final   Candida krusei NOT DETECTED NOT DETECTED Final  Candida parapsilosis NOT DETECTED NOT DETECTED Final   Candida tropicalis NOT DETECTED NOT DETECTED Final    Comment: Performed at [facility], [address] [place], [place] [address]  SARS Coronavirus 2 (CEPHEID- Performed in [facility] hospital lab), Hosp Order     Status: None   Collection Time: 02/16/19  4:42 PM   Specimen: Nasopharyngeal Swab  Result Value Ref Range Status   SARS Coronavirus 2 NEGATIVE NEGATIVE Final    Comment: (NOTE) If result is NEGATIVE SARS-CoV-2 target nucleic acids are NOT DETECTED. The SARS-CoV-2 RNA is generally detectable in upper and lower  respiratory specimens during the acute phase of infection. The lowest  concentration of SARS-CoV-2 viral copies this assay can detect is 250  copies / mL. A negative result does not preclude SARS-CoV-2 infection  and should not be used as the sole basis for treatment or other  patient management decisions.  A negative result may occur with  improper specimen collection / handling, submission of specimen other  than nasopharyngeal swab, presence of viral mutation(s) within the  areas targeted by this assay, and inadequate number of viral copies  (<250 copies / mL). A negative result must be combined with clinical  observations, patient history, and epidemiological information. If result is POSITIVE SARS-CoV-2 target nucleic  acids are DETECTED. The SARS-CoV-2 RNA is generally detectable in upper and lower  respiratory specimens dur ing the acute phase of infection.  Positive  results are indicative of active infection with SARS-CoV-2.  Clinical  correlation with patient history and other diagnostic information is  necessary to determine patient infection status.  Positive results do  not rule out bacterial infection or co-infection with other viruses. If result is PRESUMPTIVE POSTIVE SARS-CoV-2 nucleic acids MAY BE PRESENT.   A presumptive positive result was obtained on the submitted specimen  and confirmed on repeat testing.  While 2019 novel coronavirus  (SARS-CoV-2) nucleic acids may be present in the submitted sample  additional confirmatory testing may be necessary for epidemiological  and / or clinical management purposes  to differentiate between  SARS-CoV-2 and other Sarbecovirus currently known to infect humans.  If clinically indicated additional testing with an alternate test  methodology [phone] is advised. The SARS-CoV-2 RNA is generally  detectable in upper and lower respiratory sp ecimens during the acute  phase of infection. The expected result is Negative. Fact Sheet for Patients:  BoilerBrush.com.cyhttps://www.fda.gov/media/136312/download Fact Sheet for Healthcare Providers: https://pope.com/https://www.fda.gov/media/136313/download This test is not yet approved or cleared by the [place] FDA and has been authorized for detection and/or diagnosis of SARS-CoV-2 by FDA under an Emergency Use Authorization (EUA).  This EUA will remain in effect (meaning this test can be used) for the duration of the COVID-19 declaration under Section 564(b)(1) of the Act, 21 U.S.C. section 360bbb-3(b)(1), unless the authorization is terminated or revoked sooner. Performed at [facility], [address] [place], [place] [address]   Culture, blood (single) w Reflex to ID Panel     Status: None   Collection Time:  02/18/19 12:14 PM   Specimen: BLOOD  Result Value Ref Range Status   Specimen Description BLOOD BLOOD LEFT ARM  Final   Special Requests   Final    BOTTLES DRAWN AEROBIC AND ANAEROBIC Blood Culture results may not be optimal due to an excessive volume of blood received in culture bottles   Culture   Final    NO GROWTH 5 DAYS Performed at [facility], [address]  [place], [place] [address]    Report Status 02/23/2019 FINAL  Final    Coagulation Studies: No results for input(s): LABPROT, INR in the last 72 hours.  Urinalysis: No results for input(s): COLORURINE, LABSPEC, PHURINE, GLUCOSEU, HGBUR, BILIRUBINUR, KETONESUR, PROTEINUR, UROBILINOGEN, NITRITE, LEUKOCYTESUR in the last 72 hours.  Invalid input(s): APPERANCEUR    Imaging: No results found.   Medications:   . sodium chloride Stopped (03/08/19 1155)  . dextrose 50 mL/hr at 03/09/19 1254  . piperacillin-tazobactam (ZOSYN)  IV 3.375 g (03/09/19 9147)   . ascorbic acid  250 mg Per Tube BID  . B-complex with vitamin C  1 tablet Oral Daily  . bisacodyl  10 mg Rectal Daily  . ciprofloxacin  1 drop Both Eyes Q4H while awake  . feeding supplement (NEPRO CARB STEADY)  1,000 mL Per Tube Q24H  . folic acid  1 mg Oral Daily  . free water  30 mL Per Tube Q4H  . furosemide  20 mg Intravenous Daily  . miconazole   Topical Daily  . multivitamin with minerals  1 tablet Oral Daily  . nystatin   Topical BID  . pantoprazole (PROTONIX) IV  40 mg Intravenous Q12H  . tamsulosin  0.4 mg Oral Daily  . thiamine  100 mg Oral Daily   sodium chloride, acetaminophen **OR** acetaminophen, acetaminophen, iopamidol, lidocaine, magic mouthwash **AND** lidocaine, lip balm, ondansetron **OR** ondansetron (ZOFRAN) IV, senna, sodium chloride flush, sodium chloride flush  Assessment/ Plan:  Ms. Claudia Case is a 78 y.o. white female with osteoarthritis, GERD, hyperlipidemia, hypertension, psoriatic arthritis, who was admitted to [facility]  on 02/16/2019 for evaluation of severe stomatitis in response to methotrexate.  1.  Acute renal failure, possibly due to high protein content tube feeds with dehydration. 2.  Hypokalemia 3. Hypernatremia: 149 3.  Metabolic acidosis  4.  Drug reaction with severe stomatitis secondary to methotrexate. 5. Anemia with renal failure.  6. Hypertension   Plan: - Discontinue furosemide.  - Start D5W at 37mL/hr   LOS: 21 Claudia Case 7/11/20201:18 PM

## 2019-03-09 NOTE — Progress Notes (Signed)
Patient removed midline. IV consult order put in for new placement.

## 2019-03-09 NOTE — Progress Notes (Signed)
Pharmacy Lovenox Dosing  78 y.o. female admitted with Hematuria and Emesis . Patient ordered Lovenox 30 mg daily for VTE prophylaxis.   Filed Weights   02/16/19 1502 02/16/19 2158  Weight: 134 lb (60.8 kg) 130 lb 3.2 oz (59.1 kg)    Body mass index is 22.35 kg/m.  Estimated Creatinine Clearance: 29.7 mL/min (A) (by C-G formula based on SCr of 1.35 mg/dL (H)).  Will adjust Lovenox dosing to 40 mg daily per protocol.    Pearla Dubonnet 03/09/2019 6:36 PM

## 2019-03-09 NOTE — Progress Notes (Signed)
Patient bumped arm on bed rail, skin tear appr. 3cmX3cm on right forearm. Cleaned with NS, wrapped with kerlix

## 2019-03-09 NOTE — Progress Notes (Signed)
Patient pulled out NG tube. Notified Dr. Jannifer Franklin, received verbal order for dextrose 10% @ 7ml/hr and consult to IR to reinsert tubing.

## 2019-03-10 ENCOUNTER — Inpatient Hospital Stay: Payer: Medicare HMO

## 2019-03-10 LAB — BASIC METABOLIC PANEL
Anion gap: 6 (ref 5–15)
BUN: 47 mg/dL — ABNORMAL HIGH (ref 8–23)
CO2: 24 mmol/L (ref 22–32)
Calcium: 7.6 mg/dL — ABNORMAL LOW (ref 8.9–10.3)
Chloride: 116 mmol/L — ABNORMAL HIGH (ref 98–111)
Creatinine, Ser: 1.23 mg/dL — ABNORMAL HIGH (ref 0.44–1.00)
GFR calc Af Amer: 49 mL/min — ABNORMAL LOW (ref >60)
GFR calc non Af Amer: 42 mL/min — ABNORMAL LOW (ref >60)
Glucose, Bld: 122 mg/dL — ABNORMAL HIGH (ref 70–99)
Potassium: 3.1 mmol/L — ABNORMAL LOW (ref 3.5–5.1)
Sodium: 146 mmol/L — ABNORMAL HIGH (ref 135–145)

## 2019-03-10 LAB — GLUCOSE, CAPILLARY
Glucose-Capillary: 106 mg/dL — ABNORMAL HIGH (ref 70–99)
Glucose-Capillary: 119 mg/dL — ABNORMAL HIGH (ref 70–99)
Glucose-Capillary: 113 mg/dL — ABNORMAL HIGH (ref 70–99)
Glucose-Capillary: 103 mg/dL — ABNORMAL HIGH (ref 70–99)
Glucose-Capillary: 100 mg/dL — ABNORMAL HIGH (ref 70–99)

## 2019-03-10 LAB — ALBUMIN: Albumin: 1.7 g/dL — ABNORMAL LOW (ref 3.5–5.0)

## 2019-03-10 MED ORDER — POTASSIUM CHLORIDE 10 MEQ/100ML IV SOLN
10.0000 meq | INTRAVENOUS | Status: AC
  Administered 2019-03-10 – 2019-03-11 (×4): 10 meq via INTRAVENOUS
  Filled 2019-03-10 (×2): qty 100

## 2019-03-10 MED ORDER — POTASSIUM CHLORIDE 20 MEQ/15ML (10%) PO SOLN
40.0000 meq | Freq: Once | ORAL | Status: AC
  Administered 2019-03-10: 13:00:00 40 meq via ORAL
  Filled 2019-03-10: qty 30

## 2019-03-10 MED ORDER — HYDRALAZINE HCL 20 MG/ML IJ SOLN
10.0000 mg | INTRAMUSCULAR | Status: DC | PRN
  Administered 2019-03-10: 22:00:00 10 mg via INTRAVENOUS
  Filled 2019-03-10: qty 1

## 2019-03-10 MED ORDER — POTASSIUM CL IN DEXTROSE 5% 20 MEQ/L IV SOLN: 20.0000 meq | INTRAVENOUS | Status: DC

## 2019-03-10 NOTE — Progress Notes (Signed)
Physical Therapy Treatment Patient Details Name: Claudia Case MRN: [id number] DOB: [birth year] Today's Date: 03/10/2019    History of Present Illness 78 year old female that presents with weakness, mouth sores, pain in her hands, and nausea/vomiting. She was found to have pancytopenia, elevated bilirubin levels. She has a history of RA, which has been managed with methotrexate.    PT Comments    Pt in room, nursing finishing care, ready for session.  To edge of bed with min assist.  Once sitting, he is able to sit unsupported but is generally unsafe to be left unattended due to strength and cognition.  Stood to walker with min/mod a x 1.  Once standing she is able to sidestep to right along bed and transfer to recliner.  She fatigued quickly and knees noted to be sinking upon fully turning to recliner.  Remained up and nursing updated on transfer status.  Unable to progress gait this session.   Follow Up Recommendations  SNF     Equipment Recommendations  Rolling walker with 5" wheels;Wheelchair (measurements PT);Wheelchair cushion (measurements PT);3in1 (PT)    Recommendations for Other Services       Precautions / Restrictions Precautions Precautions: Fall Restrictions Weight Bearing Restrictions: No    Mobility  Bed Mobility Overal bed mobility: Needs Assistance       Supine to sit: Min assist        Transfers Overall transfer level: Needs assistance Equipment used: Rolling walker (2 wheeled) Transfers: Sit to/from Stand Sit to Stand: Min assist;Mod assist            Ambulation/Gait Ambulation/Gait assistance: Min assist;Mod assist Gait Distance (Feet): 3 Feet Assistive device: Rolling walker (2 wheeled) Gait Pattern/deviations: Step-to pattern Gait velocity: decreased   General Gait Details: able to step to chair.  LE's weak with sinking knees noted upon turning to chair.   Stairs             Wheelchair Mobility    Modified Rankin (Stroke  Patients Only)       Balance Overall balance assessment: Needs assistance Sitting-balance support: Feet supported Sitting balance-Leahy Scale: Fair     Standing balance support: Bilateral upper extremity supported Standing balance-Leahy Scale: Poor Standing balance comment: increased assist today.  while she is able to sit unsupported, she is unsafe to be left unattended due to strength and cognition                            Cognition Arousal/Alertness: Awake/alert Behavior During Therapy: WFL for tasks assessed/performed Overall Cognitive Status: Impaired/Different from baseline                                 General Comments: difficulty following 1 step directions at times,  verbal and tactile cues needed.      Exercises      General Comments        Pertinent Vitals/Pain Pain Assessment: No/denies pain    Home Living                      Prior Function            PT Goals (current goals can now be found in the care plan section) Progress towards PT goals: Progressing toward goals    Frequency    Min 2X/week      PT Plan Current plan  remains appropriate    Co-evaluation              AM-PAC PT "6 Clicks" Mobility   Outcome Measure  Help needed turning from your back to your side while in a flat bed without using bedrails?: A Little Help needed moving from lying on your back to sitting on the side of a flat bed without using bedrails?: A Lot Help needed moving to and from a bed to a chair (including a wheelchair)?: A Lot Help needed standing up from a chair using your arms (e.g., wheelchair or bedside chair)?: A Lot Help needed to walk in hospital room?: A Lot Help needed climbing 3-5 steps with a railing? : Total 6 Click Score: 12    End of Session Equipment Utilized During Treatment: Oxygen;Gait belt Activity Tolerance: Patient tolerated treatment well Patient left: in chair;with call bell/phone within  reach;with chair alarm set Nurse Communication: Mobility status       Time: 1140-1153 PT Time Calculation (min) (ACUTE ONLY): 13 min  Charges:  $Therapeutic Activity: 8-22 mins                     Chesley Noon, PTA 03/10/19, 1:45 PM

## 2019-03-10 NOTE — Plan of Care (Signed)
  Problem: Education: Goal: Knowledge of General Education information will improve Description: Including pain rating scale, medication(s)/side effects and non-pharmacologic comfort measures Outcome: Progressing   Problem: Health Behavior/Discharge Planning: Goal: Ability to manage health-related needs will improve Outcome: Progressing   Problem: Clinical Measurements: Goal: Ability to maintain clinical measurements within normal limits will improve Outcome: Progressing Goal: Diagnostic test results will improve Outcome: Progressing   Problem: Safety: Goal: Ability to remain free from injury will improve Outcome: Progressing   Problem: Skin Integrity: Goal: Risk for impaired skin integrity will decrease Outcome: Progressing

## 2019-03-10 NOTE — Progress Notes (Signed)
[facility] [place] [facility]   PATIENT NAME: Dewayne ShorterLelia Cui    MR#:  [id number]  DATE OF BIRTH:  02-14-1941  SUBJECTIVE:  CHIEF COMPLAINT:   Chief Complaint  Patient presents with  . Hematuria  . Emesis   Poor historian.  Barely any oral intake. Afebrile  REVIEW OF SYSTEMS:  Review of Systems  Constitutional: Negative for chills and fever.  HENT: Negative for hearing loss and tinnitus.   Eyes: Negative for blurred vision and double vision.  Respiratory: Negative for cough and shortness of breath.   Cardiovascular: Negative for chest pain and palpitations.  Gastrointestinal: Negative for heartburn and nausea.       Inflammed oral mucosa  Genitourinary: Negative for dysuria and urgency.  Musculoskeletal: Negative for myalgias and neck pain.  Skin: Negative for itching and rash.  Neurological: Negative for dizziness and headaches.  Psychiatric/Behavioral: Negative for depression and hallucinations.    DRUG ALLERGIES:  No Known Allergies VITALS:  Blood pressure (!) 177/85, pulse 78, temperature 98.3 F (36.8 C), resp. rate 19, height 5\' 4"  (1.626 m), weight 59.1 kg, SpO2 99 %. PHYSICAL EXAMINATION:   Physical Exam  HENT:  Head: Normocephalic.  Right Ear: External ear normal.  Inflammed oral mucosa  Eyes: Pupils are equal, round, and reactive to light. Conjunctivae are normal. Right eye exhibits no discharge.  Neck: Normal range of motion. Neck supple. No thyromegaly present.  Cardiovascular: Normal rate, regular rhythm and normal heart sounds.  Respiratory: Effort normal and breath sounds normal.  GI: Soft. Bowel sounds are normal. She exhibits no distension. There is no abdominal tenderness.  Musculoskeletal: Normal range of motion.        General: No edema.     Comments: Wrist and ankle swelling  Neurological:  confused  Skin: Skin is warm. She is not diaphoretic. No erythema.  Psychiatric: She has a normal mood and affect. Her behavior is  normal.   LABORATORY PANEL:  Female CBC Recent Labs  Lab 03/09/19 0349  WBC 13.7*  HGB 8.0*  HCT 24.8*  PLT 259   ------------------------------------------------------------------------------------------------------------------ Chemistries  Recent Labs  Lab 03/09/19 0349 03/10/19 0933  NA 149* 146*  K 3.2* 3.1*  CL 120* 116*  CO2 23 24  GLUCOSE 136* 122*  BUN 62* 47*  CREATININE 1.35* 1.23*  CALCIUM 7.9* 7.6*  MG 2.2  --    RADIOLOGY:  Dg Chest 2 View  Result Date: 03/10/2019 CLINICAL DATA:  78 y.o female with osteoarthritis, GERD, hyperlipidemia, hypertension, psoriatic arthritis, who was [facility] on 02/16/2019 for evaluation of severe stomatitis in response to methotrexate. Nonsmoker. EXAM: CHEST - 2 VIEW COMPARISON:  Prior exams, most recent dated 03/04/2019 FINDINGS: Interstitial and airspace opacities are again noted, greatest in the right upper lobe. There is been a slight increase in lung opacification in the medial right upper lobe. Other lung opacities are stable. Lateral view shows moderate bilateral pleural effusions layering posteriorly. No pneumothorax. IMPRESSION: 1. Bilateral interstitial and airspace lung opacities, consistent with multifocal pneumonia, with a mild increase in the airspace opacity in the medial right upper lobe since the most recent prior exam. Moderate bilateral pleural effusions. Electronically Signed   By: Amie Portlandavid  Ormond M.D.   On: 03/10/2019 09:36   ASSESSMENT AND PLAN:   * Tachypnea with acute pulmonary edema and fluid overload.  Patient being diuresed with IV Lasix.  On empiric antibiotics with IV Zosyn to cover for probable pneumonia.  CTA chest not done due  to renal failure.  VQ scan not done because patient unable to lay flat.  Bilateral lower extremity venous Doppler ultrasound negative for DVT.  Clinical probably for PE very low.   Chest x-ray continues to show pulmonary edema and pleural effusions We will stop D5 today  *  Severe oral mucositis and thrush.  Continue nystatin, Viscous Lidocaine and chlorhexidine.  * Failure to thrive and severe malnutrition.:  She was getting tube feeds through [facility].  Unfortunately she has pulled this out.  consult IR to replace on Monday.  * Acute kidney injury and dehydration.    Being followed by nephrologist.  Felt to be likely due to dehydration.  Due to recent fluid overload patient being diuresed with Lasix.  * Hyperkalemia.  Better after Lasix  * Neutropenic fever on admission secondary to methotrexate.  History of psoriatic arthritis for which patient was previously on methotrexate. Finished broad-spectrum antibiotics.  Neutropenia resolved.  Remains afebrile.  * Pancytopenia secondary to methotrexate toxicity.  Status post leucovorin and platelet transfusion.improved.  *Elevated liver function tests and jaundice.  This also has improved.  * Hypernatremia.  This has improved.  * Conjunctivitis started Ciloxan eyedrops  * Anemia; most likely due to anemia of chronic disease. 1 unit packed RBCs transfused on 03/08/2019  *DVT prophylaxis with Lovenox  CODE STATUS: DNR  TOTAL TIME TAKING CARE OF THIS PATIENT: 34 minutes.   POSSIBLE D/C IN 4-5 DAYS, DEPENDING ON CLINICAL CONDITION.  Neita Carp M.D on 03/10/2019 at 11:52 AM  Between 7am to 6pm - Pager - [phone]  After 6pm go to [email or web address] - Proofreader  [facility]  Office  [phone]  CC: Primary care physician; Sherrin Daisy, MD  Note: This dictation was prepared with Dragon dictation along with smaller phrase technology. Any transcriptional errors that result from this process are unintentional.

## 2019-03-10 NOTE — Progress Notes (Signed)
PHARMACY CONSULT NOTE - FOLLOW UP  Pharmacy Consult for Electrolyte Monitoring and Replacement   Recent Labs: Potassium (mmol/L)  Date Value  03/10/2019 3.1 (L)   Magnesium (mg/dL)  Date Value  03/09/2019 2.2   Calcium (mg/dL)  Date Value  03/10/2019 7.6 (L)   Albumin (g/dL)  Date Value  03/10/2019 1.7 (L)   Phosphorus (mg/dL)  Date Value  03/08/2019 4.9 (H)   Sodium (mmol/L)  Date Value  03/10/2019 146 (H)     Assessment: Patient is a 78yo female admitted with neutropenia and stomatitis. Pharmacy consulted for electrolyte replacement. Patient is refusing oral medications at this time, previous order for KCl 37mEq PO times one but  patient spit dose out.   Plan:  Will order KCl 36mEq IV times 4. Follow up on AM labs.  Paulina Fusi, PharmD, BCPS 03/10/2019 2:06 PM

## 2019-03-10 NOTE — Progress Notes (Signed)
[facility]  ROUNDING NOTE   Subjective:   Na 146 (149) K 3.1 (3.2) Creatinine 1.23 (1.35)  Alert to self.   Objective:  Vital signs in last 24 hours:  Temp:  [97 F (36.1 C)-98.3 F (36.8 C)] 98.3 F (36.8 C) (07/12 0436) Pulse Rate:  [70-78] 78 (07/12 0436) Resp:  [19-20] 19 (07/12 0436) BP: (148-177)/(70-95) 177/85 (07/12 0436) SpO2:  [99 %-100 %] 99 % (07/12 0436)  Weight change:  Filed Weights   02/16/19 1502 02/16/19 2158  Weight: 60.8 kg 59.1 kg    Intake/Output: I/O last 3 completed shifts: In: -  Out: 900 [Urine:900]   Intake/Output this shift:  No intake/output data recorded.  Physical Exam: General: NAD,   Head: +stomatitis  Eyes: Anicteric, PERRL  Neck: Supple, trachea midline  Lungs:  Clear bilaterally  Heart: Regular rate and rhythm  Abdomen:  Soft, nontender,   Extremities: + peripheral edema.  Neurologic: Alert to self  Skin: No lesions       Basic Metabolic Panel: Recent Labs  Lab 03/06/19 0440 03/07/19 0314 03/08/19 0640 03/09/19 0349 03/10/19 0933  NA 142 145 147* 149* 146*  K 4.8 3.9 3.7 3.2* 3.1*  CL 118* 117* 120* 120* 116*  CO2 20* 19* 21* 23 24  GLUCOSE 162* 172* 175* 136* 122*  BUN 71* 71* 67* 62* 47*  CREATININE 1.60* 1.46* 1.36* 1.35* 1.23*  CALCIUM 7.9* 8.1* 7.9* 7.9* 7.6*  MG  --  2.2 2.2 2.2  --   PHOS  --  5.3* 4.9*  --   --     Liver Function Tests: Recent Labs  Lab 03/10/19 0933  ALBUMIN 1.7*   No results for input(s): LIPASE, AMYLASE in the last 168 hours. No results for input(s): AMMONIA in the last 168 hours.  CBC: Recent Labs  Lab 03/05/19 0342 03/06/19 0440 03/07/19 0314 03/08/19 0640 03/09/19 0349  WBC 10.8* 11.6* 12.7* 13.9* 13.7*  HGB 7.1* 7.1* 7.3* 6.9* 8.0*  HCT 23.1* 22.9* 23.6* 21.8* 24.8*  MCV 112.7* 111.2* 110.8* 109.0* 101.6*  PLT 272 290 299 294 259    Cardiac Enzymes: No results for input(s): CKTOTAL, CKMB, CKMBINDEX, TROPONINI in the last 168  hours.  BNP: Invalid input(s): POCBNP  CBG: Recent Labs  Lab 03/09/19 1206 03/09/19 1613 03/09/19 2016 03/10/19 0526 03/10/19 0737  GLUCAP 127* 144* 123* 106* 119*    Microbiology: Results for orders placed or performed during the hospital encounter of 02/16/19  Culture, blood (Routine x 2)     Status: None   Collection Time: 02/16/19  3:29 PM   Specimen: BLOOD  Result Value Ref Range Status   Specimen Description BLOOD LEFT ANTECUBITAL  Final   Special Requests   Final    BOTTLES DRAWN AEROBIC AND ANAEROBIC Blood Culture results may not be optimal due to an excessive volume of blood received in culture bottles   Culture   Final    NO GROWTH 5 DAYS Performed at [facility], [address] [place], [place] [address]    Report Status 02/25/2019 FINAL  Final  Culture, blood (Routine x 2)     Status: Abnormal   Collection Time: 02/16/19  4:40 PM   Specimen: BLOOD  Result Value Ref Range Status   Specimen Description   Final    BLOOD RIGHT ANTECUBITAL Performed at [facility], [address] [place], [place] [address]    Special Requests   Final    BOTTLES DRAWN AEROBIC  AND ANAEROBIC Blood Culture adequate volume Performed at [facility], [address] [place], [place] [address]    Culture  Setup Time   Final    GRAM POSITIVE COCCI ANAEROBIC BOTTLE ONLY CRITICAL RESULT CALLED TO, READ BACK BY AND VERIFIED WITH: Crist FatHANNAH WANG ON 02/17/2019 AT 1051 QSD    Culture (A)  Final    STAPHYLOCOCCUS SPECIES (COAGULASE NEGATIVE) THE SIGNIFICANCE OF ISOLATING THIS ORGANISM FROM A SINGLE SET OF BLOOD CULTURES WHEN MULTIPLE SETS ARE DRAWN IS UNCERTAIN. PLEASE NOTIFY THE MICROBIOLOGY DEPARTMENT WITHIN ONE WEEK IF SPECIATION AND SENSITIVITIES ARE REQUIRED. Performed at [facility], [address] [place], [place] [address]    Report Status 02/19/2019 FINAL  Final  Urine culture     Status: None   Collection Time: 02/16/19  4:40 PM   Specimen:  Urine, Random  Result Value Ref Range Status   Specimen Description   Final    URINE, RANDOM Performed at [facility], [address] [place], [place] [address]    Special Requests   Final    NONE Performed at [facility], [address] [place], [place] [address]    Culture   Final    NO GROWTH Performed at [facility], [address] [place], [place] [address]    Report Status 02/18/2019 FINAL  Final  Blood Culture ID Panel (Reflexed)     Status: Abnormal   Collection Time: 02/16/19  4:40 PM  Result Value Ref Range Status   Enterococcus species NOT DETECTED NOT DETECTED Final   Listeria monocytogenes NOT DETECTED NOT DETECTED Final   Staphylococcus species DETECTED (A) NOT DETECTED Final    Comment: Methicillin (oxacillin) susceptible coagulase negative staphylococcus. Possible blood culture contaminant (unless isolated from more than one blood culture draw or clinical case suggests pathogenicity). No antibiotic treatment is indicated for blood  culture contaminants. CRITICAL RESULT CALLED TO, READ BACK BY AND VERIFIED WITH: Crist FatHANNAH WANG ON 02/17/2019 AT 1051 QSD    Staphylococcus aureus (BCID) NOT DETECTED NOT DETECTED Final   Methicillin resistance NOT DETECTED NOT DETECTED Final   Streptococcus species NOT DETECTED NOT DETECTED Final   Streptococcus agalactiae NOT DETECTED NOT DETECTED Final   Streptococcus pneumoniae NOT DETECTED NOT DETECTED Final   Streptococcus pyogenes NOT DETECTED NOT DETECTED Final   Acinetobacter baumannii NOT DETECTED NOT DETECTED Final   Enterobacteriaceae species NOT DETECTED NOT DETECTED Final   Enterobacter cloacae complex NOT DETECTED NOT DETECTED Final   Escherichia coli NOT DETECTED NOT DETECTED Final   Klebsiella oxytoca NOT DETECTED NOT DETECTED Final   Klebsiella pneumoniae NOT DETECTED NOT DETECTED Final   Proteus species NOT DETECTED NOT DETECTED Final   Serratia marcescens NOT DETECTED NOT DETECTED Final    Haemophilus influenzae NOT DETECTED NOT DETECTED Final   Neisseria meningitidis NOT DETECTED NOT DETECTED Final   Pseudomonas aeruginosa NOT DETECTED NOT DETECTED Final   Candida albicans NOT DETECTED NOT DETECTED Final   Candida glabrata NOT DETECTED NOT DETECTED Final   Candida krusei NOT DETECTED NOT DETECTED Final   Candida parapsilosis NOT DETECTED NOT DETECTED Final   Candida tropicalis NOT DETECTED NOT DETECTED Final    Comment: Performed at [facility], [address] [place], [place] [address]  SARS Coronavirus 2 (CEPHEID- Performed in [facility] lab), Hosp Order     Status: None   Collection Time: 02/16/19  4:42 PM   Specimen: Nasopharyngeal Swab  Result Value Ref Range Status  SARS Coronavirus 2 NEGATIVE NEGATIVE Final    Comment: (NOTE) If result is NEGATIVE SARS-CoV-2 target nucleic acids are NOT DETECTED. The SARS-CoV-2 RNA is generally detectable in upper and lower  respiratory specimens during the acute phase of infection. The lowest  concentration of SARS-CoV-2 viral copies this assay can detect is 250  copies / mL. A negative result does not preclude SARS-CoV-2 infection  and should not be used as the sole basis for treatment or other  patient management decisions.  A negative result may occur with  improper specimen collection / handling, submission of specimen other  than nasopharyngeal swab, presence of viral mutation(s) within the  areas targeted by this assay, and inadequate number of viral copies  (<250 copies / mL). A negative result must be combined with clinical  observations, patient history, and epidemiological information. If result is POSITIVE SARS-CoV-2 target nucleic acids are DETECTED. The SARS-CoV-2 RNA is generally detectable in upper and lower  respiratory specimens dur ing the acute phase of infection.  Positive  results are indicative of active infection with SARS-CoV-2.  Clinical  correlation with patient history and  other diagnostic information is  necessary to determine patient infection status.  Positive results do  not rule out bacterial infection or co-infection with other viruses. If result is PRESUMPTIVE POSTIVE SARS-CoV-2 nucleic acids MAY BE PRESENT.   A presumptive positive result was obtained on the submitted specimen  and confirmed on repeat testing.  While 2019 novel coronavirus  (SARS-CoV-2) nucleic acids may be present in the submitted sample  additional confirmatory testing may be necessary for epidemiological  and / or clinical management purposes  to differentiate between  SARS-CoV-2 and other Sarbecovirus currently known to infect humans.  If clinically indicated additional testing with an alternate test  methodology [phone]([id number]) is advised. The SARS-CoV-2 RNA is generally  detectable in upper and lower respiratory sp ecimens during the acute  phase of infection. The expected result is Negative. Fact Sheet for Patients:  BoilerBrush.com.cyhttps://www.fda.gov/media/136312/download Fact Sheet for Healthcare Providers: https://pope.com/https://www.fda.gov/media/136313/download This test is not yet approved or cleared by the [place] FDA and has been authorized for detection and/or diagnosis of SARS-CoV-2 by FDA under an Emergency Use Authorization (EUA).  This EUA will remain in effect (meaning this test can be used) for the duration of the COVID-19 declaration under Section 564(b)(1) of the Act, 21 U.S.C. section 360bbb-3(b)(1), unless the authorization is terminated or revoked sooner. Performed at [facility], [address] [place], [place] [address]   Culture, blood (single) w Reflex to ID Panel     Status: None   Collection Time: 02/18/19 12:14 PM   Specimen: BLOOD  Result Value Ref Range Status   Specimen Description BLOOD BLOOD LEFT ARM  Final   Special Requests   Final    BOTTLES DRAWN AEROBIC AND ANAEROBIC Blood Culture results may not be optimal due to an excessive volume of blood  received in culture bottles   Culture   Final    NO GROWTH 5 DAYS Performed at [facility], [address] [place], [place] [address]    Report Status 02/23/2019 FINAL  Final    Coagulation Studies: No results for input(s): LABPROT, INR in the last 72 hours.  Urinalysis: No results for input(s): COLORURINE, LABSPEC, PHURINE, GLUCOSEU, HGBUR, BILIRUBINUR, KETONESUR, PROTEINUR, UROBILINOGEN, NITRITE, LEUKOCYTESUR in the last 72 hours.  Invalid input(s): APPERANCEUR    Imaging: Dg Chest 2 View  Result Date: 03/10/2019 CLINICAL DATA:  78 y.o female  with osteoarthritis, GERD, hyperlipidemia, hypertension, psoriatic arthritis, who was admitted to [facility] on 02/16/2019 for evaluation of severe [facility]. Nonsmoker. EXAM: CHEST - 2 VIEW COMPARISON:  Prior exams, most recent dated 03/04/2019 FINDINGS: Interstitial and airspace opacities are again noted, greatest in the right upper lobe. There is been a slight increase in lung opacification in the medial right upper lobe. Other lung opacities are stable. Lateral view shows moderate bilateral pleural effusions layering posteriorly. No pneumothorax. IMPRESSION: 1. Bilateral interstitial and airspace lung opacities, consistent with multifocal pneumonia, with a mild increase in the airspace opacity in the medial right upper lobe since the most recent prior exam. Moderate bilateral pleural effusions. Electronically Signed   By: Amie Portlandavid  Ormond M.D.   On: 03/10/2019 09:36     Medications:   . sodium chloride Stopped (03/08/19 1155)  . dextrose 50 mL/hr at 03/09/19 1400  . piperacillin-tazobactam (ZOSYN)  IV 3.375 g (03/10/19 0645)   . ascorbic acid  250 mg Per Tube BID  . B-complex with vitamin C  1 tablet Oral Daily  . bisacodyl  10 mg Rectal Daily  . ciprofloxacin  1 drop Both Eyes Q4H while awake  . enoxaparin (LOVENOX) injection  40 mg Subcutaneous Q24H  . feeding supplement (NEPRO CARB STEADY)  1,000 mL Per  Tube Q24H  . folic acid  1 mg Oral Daily  . free water  30 mL Per Tube Q4H  . furosemide  20 mg Intravenous Daily  . miconazole   Topical Daily  . multivitamin with minerals  1 tablet Oral Daily  . nystatin   Topical BID  . pantoprazole (PROTONIX) IV  40 mg Intravenous Q12H  . tamsulosin  0.4 mg Oral Daily  . thiamine  100 mg Oral Daily   sodium chloride, acetaminophen **OR** acetaminophen, acetaminophen, iopamidol, lidocaine, magic mouthwash **AND** lidocaine, lip balm, ondansetron **OR** ondansetron (ZOFRAN) IV, senna, sodium chloride flush, sodium chloride flush  Assessment/ Plan:  Ms. Claudia Case is a 78 y.o. white female with osteoarthritis, GERD, hyperlipidemia, hypertension, psoriatic arthritis, who [facility]2019 for evaluation of severe stomatitis in response to methotrexate.  1.  Acute renal failure, possibly due to high protein content tube feeds with dehydration. 2.  Hypokalemia 3. Hypernatremia:  3.  Metabolic acidosis  4.  Drug reaction with severe stomatitis secondary to methotrexate. 5. Anemia with renal failure.  6. Hypertension   Plan: - given furosemide IV this morning.  - Continue D5W at 6475mL/hr   LOS: 22 Keirstin Musil 7/12/202010:49 AM

## 2019-03-11 ENCOUNTER — Inpatient Hospital Stay: Payer: Medicare HMO

## 2019-03-11 LAB — BASIC METABOLIC PANEL
Anion gap: 4 — ABNORMAL LOW (ref 5–15)
BUN: 42 mg/dL — ABNORMAL HIGH (ref 8–23)
CO2: 24 mmol/L (ref 22–32)
Calcium: 7.6 mg/dL — ABNORMAL LOW (ref 8.9–10.3)
Chloride: 120 mmol/L — ABNORMAL HIGH (ref 98–111)
Creatinine, Ser: 1.1 mg/dL — ABNORMAL HIGH (ref 0.44–1.00)
GFR calc Af Amer: 56 mL/min — ABNORMAL LOW (ref >60)
GFR calc non Af Amer: 48 mL/min — ABNORMAL LOW (ref >60)
Glucose, Bld: 91 mg/dL (ref 70–99)
Potassium: 3.3 mmol/L — ABNORMAL LOW (ref 3.5–5.1)
Sodium: 148 mmol/L — ABNORMAL HIGH (ref 135–145)

## 2019-03-11 LAB — POTASSIUM: Potassium: 3.9 mmol/L (ref 3.5–5.1)

## 2019-03-11 LAB — GLUCOSE, CAPILLARY
Glucose-Capillary: 89 mg/dL (ref 70–99)
Glucose-Capillary: 89 mg/dL (ref 70–99)
Glucose-Capillary: 87 mg/dL (ref 70–99)

## 2019-03-11 MED ORDER — FUROSEMIDE 10 MG/ML IJ SOLN
60.0000 mg | Freq: Two times a day (BID) | INTRAMUSCULAR | Status: DC
  Administered 2019-03-11: 10:00:00 60 mg via INTRAVENOUS

## 2019-03-11 MED ORDER — FREE WATER
100.0000 mL | Status: DC
  Administered 2019-03-11 – 2019-03-13 (×10): 100 mL

## 2019-03-11 MED ORDER — POTASSIUM CHLORIDE 10 MEQ/100ML IV SOLN
10.0000 meq | INTRAVENOUS | Status: AC
  Administered 2019-03-11 (×4): 10 meq via INTRAVENOUS
  Filled 2019-03-11 (×4): qty 100

## 2019-03-11 NOTE — Progress Notes (Signed)
OT Cancellation Note  Patient Details Name: Cleaster Shiffer MRN: [id number] DOB: December 21, 1940   Cancelled Treatment:    Reason Eval/Treat Not Completed: Fatigue/lethargy limiting ability to participate. Pt too fatigued to participate this date. Will re-attempt OT tx next date as medically appropriate.   Jeni Salles, MPH, MS, OTR/L ascom [phone] 03/11/19, 3:32 PM

## 2019-03-11 NOTE — Progress Notes (Addendum)
PHARMACY CONSULT NOTE - FOLLOW UP  Pharmacy Consult for Electrolyte Monitoring and Replacement   Recent Labs: Potassium (mmol/L)  Date Value  03/11/2019 3.3 (L)   Magnesium (mg/dL)  Date Value  03/09/2019 2.2   Calcium (mg/dL)  Date Value  03/11/2019 7.6 (L)   Albumin (g/dL)  Date Value  03/10/2019 1.7 (L)   Phosphorus (mg/dL)  Date Value  03/08/2019 4.9 (H)   Sodium (mmol/L)  Date Value  03/11/2019 148 (H)     Assessment: Patient is a 78yo female admitted with neutropenia and stomatitis. Pharmacy consulted for electrolyte replacement. Patient is refusing oral medications at this time, previous order for KCl 42mEq PO times one but  patient spit dose out.   Plan:  K 3.3   Scr 1.10   (Mag on 7/11= 2.2) Patient refusing oral meds d/t Severe oral mucositis and thrush  Will order KCl 40mEq IV times 4 again today.  Lasix 60 iv bid started- will check K at 1800 Follow up with AM labs.  Chinita Greenland PharmD Clinical Pharmacist 03/11/2019

## 2019-03-11 NOTE — Consult Note (Signed)
Pharmacy Antibiotic Note  Claudia Case is a 78 y.o. female admitted on 02/16/2019 with possible aspiration pneumonia.  Pharmacy has been consulted for Zosyn dosing.   Patient is on day 7 of Zosyn.  Plan: Will continue Zosyn 3.375g IV q8h (4 hour infusion).   Height: 5\' 4"  (162.6 cm) Weight: (unable to obtain) IBW/kg (Calculated) : 54.7  Temp (24hrs), Avg:97.7 F (36.5 C), Min:97.6 F (36.4 C), Max:97.8 F (36.6 C)  Recent Labs  Lab 03/05/19 0342 03/06/19 0440 03/07/19 0314 03/08/19 0640 03/09/19 0349 03/10/19 0933 03/11/19 0540  WBC 10.8* 11.6* 12.7* 13.9* 13.7*  --   --   CREATININE 1.39* 1.60* 1.46* 1.36* 1.35* 1.23* 1.10*    Estimated Creatinine Clearance: 36.4 mL/min (A) (by C-G formula based on SCr of 1.1 mg/dL (H)).    No Known Allergies  Antimicrobials this admission: Zosyn 7/7 >>   Thank you for allowing pharmacy to be a part of this patient's care.  Chinita Greenland PharmD Clinical Pharmacist 03/11/2019

## 2019-03-11 NOTE — Plan of Care (Signed)
  Problem: Education: Goal: Knowledge of General Education information will improve Description: Including pain rating scale, medication(s)/side effects and non-pharmacologic comfort measures Outcome: Progressing   Problem: Health Behavior/Discharge Planning: Goal: Ability to manage health-related needs will improve Outcome: Progressing   Problem: Clinical Measurements: Goal: Ability to maintain clinical measurements within normal limits will improve Outcome: Progressing Goal: Diagnostic test results will improve Outcome: Progressing   Problem: Safety: Goal: Ability to remain free from injury will improve Outcome: Progressing   Problem: Skin Integrity: Goal: Risk for impaired skin integrity will decrease Outcome: Progressing   

## 2019-03-11 NOTE — Progress Notes (Signed)
[facility]  ROUNDING NOTE   Subjective:   Patient is very lethargic this afternoon Did not answer any questions Moaning in the bed Currently getting NG tube feeds 35 cc/h  Objective:  Vital signs in last 24 hours:  Temp:  [97.6 F (36.4 C)-97.8 F (36.6 C)] 97.7 F (36.5 C) (07/13 0614) Pulse Rate:  [71-85] 71 (07/13 0614) Resp:  [18-20] 18 (07/13 0614) BP: (154-189)/(72-86) 172/86 (07/13 0614) SpO2:  [98 %-100 %] 98 % (07/13 0614)  Weight change:  Filed Weights   02/16/19 1502 02/16/19 2158  Weight: 60.8 kg 59.1 kg    Intake/Output: I/O last 3 completed shifts: In: 12.5 [IV Piggyback:12.5] Out: 575 [Urine:575]   Intake/Output this shift:  No intake/output data recorded.  Physical Exam: General: NAD,   Head: +stomatitis  Eyes: Anicteric,   Neck: Supple,  Lungs:   Nasal cannula oxygen, shallow breathing effort  Heart: Regular rate and rhythm  Abdomen:  Soft, nontender,   Extremities: + peripheral edema.  Neurologic:  Arousable, did not answer any questions  Skin: No lesions       Basic Metabolic Panel: Recent Labs  Lab 03/07/19 0314 03/08/19 0640 03/09/19 0349 03/10/19 0933 03/11/19 0540  NA 145 147* 149* 146* 148*  K 3.9 3.7 3.2* 3.1* 3.3*  CL 117* 120* 120* 116* 120*  CO2 19* 21* 23 24 24   GLUCOSE 172* 175* 136* 122* 91  BUN 71* 67* 62* 47* 42*  CREATININE 1.46* 1.36* 1.35* 1.23* 1.10*  CALCIUM 8.1* 7.9* 7.9* 7.6* 7.6*  MG 2.2 2.2 2.2  --   --   PHOS 5.3* 4.9*  --   --   --     Liver Function Tests: Recent Labs  Lab 03/10/19 0933  ALBUMIN 1.7*   No results for input(s): LIPASE, AMYLASE in the last 168 hours. No results for input(s): AMMONIA in the last 168 hours.  CBC: Recent Labs  Lab 03/05/19 0342 03/06/19 0440 03/07/19 0314 03/08/19 0640 03/09/19 0349  WBC 10.8* 11.6* 12.7* 13.9* 13.7*  HGB 7.1* 7.1* 7.3* 6.9* 8.0*  HCT 23.1* 22.9* 23.6* 21.8* 24.8*  MCV 112.7* 111.2* 110.8* 109.0* 101.6*  PLT 272 290 299 294  259    Cardiac Enzymes: No results for input(s): CKTOTAL, CKMB, CKMBINDEX, TROPONINI in the last 168 hours.  BNP: Invalid input(s): POCBNP  CBG: Recent Labs  Lab 03/10/19 1625 03/10/19 1937 03/11/19 0156 03/11/19 0819 03/11/19 1211  GLUCAP 103* 100* 89 89 87    Microbiology: Results for orders placed or performed during the hospital encounter of 02/16/19  Culture, blood (Routine x 2)     Status: None   Collection Time: 02/16/19  3:29 PM   Specimen: BLOOD  Result Value Ref Range Status   Specimen Description BLOOD LEFT ANTECUBITAL  Final   Special Requests   Final    BOTTLES DRAWN AEROBIC AND ANAEROBIC Blood Culture results may not be optimal due to an excessive volume of blood received in culture bottles   Culture   Final    NO GROWTH 5 DAYS Performed at [facility], [address] [place], [place] [address]    Report Status 02/25/2019 FINAL  Final  Culture, blood (Routine x 2)     Status: Abnormal   Collection Time: 02/16/19  4:40 PM   Specimen: BLOOD  Result Value Ref Range Status   Specimen Description   Final    BLOOD RIGHT ANTECUBITAL Performed at [facility], [address] [place],  [place] [address]    Special Requests   Final    BOTTLES DRAWN AEROBIC AND ANAEROBIC Blood Culture adequate volume Performed at [facility], [address] [place], [place] [address]    Culture  Setup Time   Final    GRAM POSITIVE COCCI ANAEROBIC BOTTLE ONLY CRITICAL RESULT CALLED TO, READ BACK BY AND VERIFIED WITH: Crist Fat ON 02/17/2019 AT 1051 QSD    Culture (A)  Final    STAPHYLOCOCCUS SPECIES (COAGULASE NEGATIVE) THE SIGNIFICANCE OF ISOLATING THIS ORGANISM FROM A SINGLE SET OF BLOOD CULTURES WHEN MULTIPLE SETS ARE DRAWN IS UNCERTAIN. PLEASE NOTIFY THE MICROBIOLOGY DEPARTMENT WITHIN ONE WEEK IF SPECIATION AND SENSITIVITIES ARE REQUIRED. Performed at [facility], [address], [place], [place] [address]    Report Status 02/19/2019  FINAL  Final  Urine culture     Status: None   Collection Time: 02/16/19  4:40 PM   Specimen: Urine, Random  Result Value Ref Range Status   Specimen Description   Final    URINE, RANDOM Performed at [facility], [address] [place], [place] [address]    Special Requests   Final    NONE Performed at [facility], [address] [place], [place] [address]    Culture   Final    NO GROWTH Performed at [facility], [address] [place], [place] [address]    Report Status 02/18/2019 FINAL  Final  Blood Culture ID Panel (Reflexed)     Status: Abnormal   Collection Time: 02/16/19  4:40 PM  Result Value Ref Range Status   Enterococcus species NOT DETECTED NOT DETECTED Final   Listeria monocytogenes NOT DETECTED NOT DETECTED Final   Staphylococcus species DETECTED (A) NOT DETECTED Final    Comment: Methicillin (oxacillin) susceptible coagulase negative staphylococcus. Possible blood culture contaminant (unless isolated from more than one blood culture draw or clinical case suggests pathogenicity). No antibiotic treatment is indicated for blood  culture contaminants. CRITICAL RESULT CALLED TO, READ BACK BY AND VERIFIED WITH: Crist Fat ON 02/17/2019 AT 1051 QSD    Staphylococcus aureus (BCID) NOT DETECTED NOT DETECTED Final   Methicillin resistance NOT DETECTED NOT DETECTED Final   Streptococcus species NOT DETECTED NOT DETECTED Final   Streptococcus agalactiae NOT DETECTED NOT DETECTED Final   Streptococcus pneumoniae NOT DETECTED NOT DETECTED Final   Streptococcus pyogenes NOT DETECTED NOT DETECTED Final   Acinetobacter baumannii NOT DETECTED NOT DETECTED Final   Enterobacteriaceae species NOT DETECTED NOT DETECTED Final   Enterobacter cloacae complex NOT DETECTED NOT DETECTED Final   Escherichia coli NOT DETECTED NOT DETECTED Final   Klebsiella oxytoca NOT DETECTED NOT DETECTED Final   Klebsiella pneumoniae NOT DETECTED NOT DETECTED Final    Proteus species NOT DETECTED NOT DETECTED Final   Serratia marcescens NOT DETECTED NOT DETECTED Final   Haemophilus influenzae NOT DETECTED NOT DETECTED Final   Neisseria meningitidis NOT DETECTED NOT DETECTED Final   Pseudomonas aeruginosa NOT DETECTED NOT DETECTED Final   Candida albicans NOT DETECTED NOT DETECTED Final   Candida glabrata NOT DETECTED NOT DETECTED Final   Candida krusei NOT DETECTED NOT DETECTED Final   Candida parapsilosis NOT DETECTED NOT DETECTED Final   Candida tropicalis NOT DETECTED NOT DETECTED Final    Comment: Performed at [facility], [address], [place], [place] [address]  SARS Coronavirus 2 (CEPHEID- Performed in [facility] hospital lab), Hosp Order     Status: None   Collection Time:  02/16/19  4:42 PM   Specimen: Nasopharyngeal Swab  Result Value Ref Range Status   SARS Coronavirus 2 NEGATIVE NEGATIVE Final    Comment: (NOTE) If result is NEGATIVE SARS-CoV-2 target nucleic acids are NOT DETECTED. The SARS-CoV-2 RNA is generally detectable in upper and lower  respiratory specimens during the acute phase of infection. The lowest  concentration of SARS-CoV-2 viral copies this assay can detect is 250  copies / mL. A negative result does not preclude SARS-CoV-2 infection  and should not be used as the sole basis for treatment or other  patient management decisions.  A negative result may occur with  improper specimen collection / handling, submission of specimen other  than nasopharyngeal swab, presence of viral mutation(s) within the  areas targeted by this assay, and inadequate number of viral copies  (<250 copies / mL). A negative result must be combined with clinical  observations, patient history, and epidemiological information. If result is POSITIVE SARS-CoV-2 target nucleic acids are DETECTED. The SARS-CoV-2 RNA is generally detectable in upper and lower  respiratory specimens dur ing the acute phase of infection.  Positive   results are indicative of active infection with SARS-CoV-2.  Clinical  correlation with patient history and other diagnostic information is  necessary to determine patient infection status.  Positive results do  not rule out bacterial infection or co-infection with other viruses. If result is PRESUMPTIVE POSTIVE SARS-CoV-2 nucleic acids MAY BE PRESENT.   A presumptive positive result was obtained on the submitted specimen  and confirmed on repeat testing.  While 2019 novel coronavirus  (SARS-CoV-2) nucleic acids may be present in the submitted sample  additional confirmatory testing may be necessary for epidemiological  and / or clinical management purposes  to differentiate between  SARS-CoV-2 and other Sarbecovirus currently known to infect humans.  If clinically indicated additional testing with an alternate test  methodology [phone]) is advised. The SARS-CoV-2 RNA is generally  detectable in upper and lower respiratory sp ecimens during the acute  phase of infection. The expected result is Negative. Fact Sheet for Patients:  StrictlyIdeas.no Fact Sheet for Healthcare Providers: BankingDealers.co.za This test is not yet approved or cleared by the [place] FDA and has been authorized for detection and/or diagnosis of SARS-CoV-2 by FDA under an Emergency Use Authorization (EUA).  This EUA will remain in effect (meaning this test can be used) for the duration of the COVID-19 declaration under Section 564(b)(1) of the Act, 21 U.S.C. section 360bbb-3(b)(1), unless the authorization is terminated or revoked sooner. Performed at [facility], [address] [place], [place] [address]   Culture, blood (single) w Reflex to ID Panel     Status: None   Collection Time: 02/18/19 12:14 PM   Specimen: BLOOD  Result Value Ref Range Status   Specimen Description BLOOD BLOOD LEFT ARM  Final   Special Requests   Final    BOTTLES  DRAWN AEROBIC AND ANAEROBIC Blood Culture results may not be optimal due to an excessive volume of blood received in culture bottles   Culture   Final    NO GROWTH 5 DAYS Performed at [facility], [address] [place], [place] [address]    Report Status 02/23/2019 FINAL  Final    Coagulation Studies: No results for input(s): LABPROT, INR in the last 72 hours.  Urinalysis: No results for input(s): COLORURINE, LABSPEC, PHURINE, GLUCOSEU, HGBUR, BILIRUBINUR, KETONESUR, PROTEINUR, UROBILINOGEN, NITRITE, LEUKOCYTESUR in the last 72 hours.  Invalid input(s): APPERANCEUR  Imaging: Dg Chest 2 View  Result Date: 03/10/2019 CLINICAL DATA:  78 y.o female with osteoarthritis, GERD, hyperlipidemia, hypertension, psoriatic arthritis, who was [facility] to [facility] on 02/16/2019 for evaluation of severe stomatitis in response to methotrexate. Nonsmoker. EXAM: CHEST - 2 VIEW COMPARISON:  Prior exams, most recent dated 03/04/2019 FINDINGS: Interstitial and airspace opacities are again noted, greatest in the right upper lobe. There is been a slight increase in lung opacification in the medial right upper lobe. Other lung opacities are stable. Lateral view shows moderate bilateral pleural effusions layering posteriorly. No pneumothorax. IMPRESSION: 1. Bilateral interstitial and airspace lung opacities, consistent with multifocal pneumonia, with a mild increase in the airspace opacity in the medial right upper lobe since the most recent prior exam. Moderate bilateral pleural effusions. Electronically Signed   By: Amie [place]avid  Ormond M.D.   On: 03/10/2019 09:36   Dg Vangie BickerNaso G Tube Plc W/fl W/rad  Result Date: 03/11/2019 CLINICAL DATA:  NG tube placement EXAM: NASO G TUBE PLACEMENT WITH FL AND WITH RAD FLUOROSCOPY TIME:  Fluoroscopy Time:  Less than 1 seconds Radiation Exposure Index (if provided by the fluoroscopic device): Less than 0.1 mGy Number of Acquired Spot Images: 0 COMPARISON:  None. FINDINGS: Real-time  fluoroscopy was utilized for placement of a Dobbhoff tube. Dobbhoff tube was inserted through the left nare without difficulty. Large hiatal hernia. Dobbhoff tube coiled within the hiatal hernia. Tip of the Dobbhoff tube could not be advanced into the small bowel after multiple attempts at manipulation of the tube. The tube was left in place with the tip within hiatal hernia. Nasogastric tube was secured to the nose with tape. IMPRESSION: Dobbhoff tube placement with the tip in the stomach within a large hiatal hernia. Electronically Signed   By: Elige KoHetal  Patel   On: 03/11/2019 09:37     Medications:   . sodium chloride Stopped (03/08/19 1155)  . piperacillin-tazobactam (ZOSYN)  IV 3.375 g (03/11/19 0654)  . potassium chloride 10 mEq (03/11/19 1326)   . ascorbic acid  250 mg Per Tube BID  . B-complex with vitamin C  1 tablet Oral Daily  . bisacodyl  10 mg Rectal Daily  . ciprofloxacin  1 drop Both Eyes Q4H while awake  . enoxaparin (LOVENOX) injection  40 mg Subcutaneous Q24H  . feeding supplement (NEPRO CARB STEADY)  1,000 mL Per Tube Q24H  . folic acid  1 mg Oral Daily  . free water  30 mL Per Tube Q4H  . furosemide  60 mg Intravenous BID  . miconazole   Topical Daily  . multivitamin with minerals  1 tablet Oral Daily  . nystatin   Topical BID  . pantoprazole (PROTONIX) IV  40 mg Intravenous Q12H  . tamsulosin  0.4 mg Oral Daily  . thiamine  100 mg Oral Daily   sodium chloride, acetaminophen **OR** acetaminophen, acetaminophen, hydrALAZINE, iopamidol, lidocaine, magic mouthwash **AND** lidocaine, lip balm, ondansetron **OR** ondansetron (ZOFRAN) IV, senna, sodium chloride flush, sodium chloride flush  Assessment/ Plan:  Ms. Gwendolyn FillLelia Ann Case is a 78 y.o. white female with osteoarthritis, GERD, hyperlipidemia, hypertension, psoriatic arthritis, who was [facility]16/2019 for evaluation of severe stomatitis in response to methotrexate.  1.  Acute renal failure, likely ATN 2.   Hypokalemia 3. Hypernatremia:  3.  Metabolic acidosis  4.  Drug reaction with severe stomatitis secondary to methotrexate. 5. Anemia with renal failure.  6.  Peripheral edema  Plan: -Oral intake is poor Nursing staff reports palliative care discussions  tomorrow with family IV potassium replacement Increase free water to 100 mL every 4 hours.  Discontinue Lasix as it may be causing intravascular volume depletion Overall prognosis appears poor   LOS: 23 Raybon Conard 7/13/20202:05 PM

## 2019-03-11 NOTE — Care Management Important Message (Signed)
Important Message  Patient Details  Name: Claudia Case MRN: [id number] Date of Birth: 16-Jan-1941   Medicare Important Message Given:  Yes     Dannette Barbara 03/11/2019, 11:22 AM

## 2019-03-11 NOTE — Progress Notes (Signed)
PT Cancellation Note  Patient Details Name: Claudia Case MRN: [id number] DOB: 02-Dec-1940   Cancelled Treatment:    Reason Eval/Treat Not Completed: Fatigue/lethargy limiting ability to participate   Pt attempted x 3 today.  Sleeping soundly each attempt.  Tech reports the same.  Will continue as appropriate as session limited by lethargy today.   Chesley Noon 03/11/2019, 1:44 PM

## 2019-03-11 NOTE — Progress Notes (Signed)
[facility] [place] [facility]   PATIENT NAME: Claudia ShorterLelia Case    MR#:  [id number]  DATE OF BIRTH:  1941/02/25  SUBJECTIVE:  CHIEF COMPLAINT:   Chief Complaint  Patient presents with   Hematuria   Emesis   Patient is drowsy today. No oral intake  REVIEW OF SYSTEMS:  Review of Systems  Constitutional: Negative for chills and fever.  HENT: Negative for hearing loss and tinnitus.   Eyes: Negative for blurred vision and double vision.  Respiratory: Negative for cough and shortness of breath.   Cardiovascular: Negative for chest pain and palpitations.  Gastrointestinal: Negative for heartburn and nausea.       Inflammed oral mucosa  Genitourinary: Negative for dysuria and urgency.  Musculoskeletal: Negative for myalgias and neck pain.  Skin: Negative for itching and rash.  Neurological: Negative for dizziness and headaches.  Psychiatric/Behavioral: Negative for depression and hallucinations.    DRUG ALLERGIES:  No Known Allergies VITALS:  Blood pressure (!) 155/78, pulse 86, temperature 98 F (36.7 C), temperature source Oral, resp. rate 18, height 5\' 4"  (1.626 m), weight 59.1 kg, SpO2 98 %. PHYSICAL EXAMINATION:   Physical Exam  HENT:  Head: Normocephalic.  Right Ear: External ear normal.  Inflammed oral mucosa  Eyes: Pupils are equal, round, and reactive to light. Conjunctivae are normal. Right eye exhibits no discharge.  Neck: Normal range of motion. Neck supple. No thyromegaly present.  Cardiovascular: Normal rate, regular rhythm and normal heart sounds.  Respiratory: Effort normal and breath sounds normal.  GI: Soft. Bowel sounds are normal. She exhibits no distension. There is no abdominal tenderness.  Musculoskeletal: Normal range of motion.        General: No edema.     Comments: Wrist and ankle swelling  Neurological:  confused  Skin: Skin is warm. She is not diaphoretic. No erythema.  Psychiatric: She has a normal mood and affect.  Her behavior is normal.   LABORATORY PANEL:  Female CBC Recent Labs  Lab 03/09/19 0349  WBC 13.7*  HGB 8.0*  HCT 24.8*  PLT 259   ------------------------------------------------------------------------------------------------------------------ Chemistries  Recent Labs  Lab 03/09/19 0349  03/11/19 0540  NA 149*   < > 148*  K 3.2*   < > 3.3*  CL 120*   < > 120*  CO2 23   < > 24  GLUCOSE 136*   < > 91  BUN 62*   < > 42*  CREATININE 1.35*   < > 1.10*  CALCIUM 7.9*   < > 7.6*  MG 2.2  --   --    < > = values in this interval not displayed.   RADIOLOGY:  Dg Naso G Tube Plc W/fl W/rad  Result Date: 03/11/2019 CLINICAL DATA:  NG tube placement EXAM: NASO G TUBE PLACEMENT WITH FL AND WITH RAD FLUOROSCOPY TIME:  Fluoroscopy Time:  Less than 1 seconds Radiation Exposure Index (if provided by the fluoroscopic device): Less than 0.1 mGy Number of Acquired Spot Images: 0 COMPARISON:  None. FINDINGS: Real-time fluoroscopy was utilized for placement of a Dobbhoff tube. Dobbhoff tube was inserted through the left nare without difficulty. Large hiatal hernia. Dobbhoff tube coiled within the hiatal hernia. Tip of the Dobbhoff tube could not be advanced into the small bowel after multiple attempts at manipulation of the tube. The tube was left in place with the tip within hiatal hernia. Nasogastric tube was secured to the nose with tape. IMPRESSION: Dobbhoff tube placement with the  tip in the stomach within a large hiatal hernia. Electronically Signed   By: Kathreen Devoid   On: 03/11/2019 09:37   ASSESSMENT AND PLAN:   * Tachypnea with acute pulmonary edema and fluid overload.  Patient being diuresed with IV Lasix.  On empiric antibiotics with IV Zosyn to cover for probable pneumonia.  CTA chest not done due to renal failure.  VQ scan not done because patient unable to lay flat.  Bilateral lower extremity venous Doppler ultrasound negative for DVT.  Clinical probably for PE very low.   Chest x-ray  continues to show pulmonary edema and pleural effusions Unfortunately patient seems to be getting intravascular volume depletion.  Lasix held by nephrology. We will continue IV Zosyn for total 10-day course  * Severe oral mucositis and thrush.  Continue nystatin, Viscous Lidocaine and chlorhexidine.  * Failure to thrive and severe malnutrition.:  Patient had pulled Dobbhoff out.  Replaced today by IR.  Appreciate help.  * Acute kidney injury and dehydration.    Being followed by nephrologist.  Felt to be likely due to dehydration.  Due to recent fluid overload patient was diuresed with Lasix.  * Hyperkalemia.  Better after Lasix  * Neutropenic fever on admission secondary to methotrexate.  History of psoriatic arthritis for which patient was previously on methotrexate. Finished broad-spectrum antibiotics.  Neutropenia resolved.  Remains afebrile.  * Pancytopenia secondary to methotrexate toxicity.  Status post leucovorin and platelet transfusion.improved.  *Elevated liver function tests and jaundice.  This also has improved.  * Hypernatremia.  This has improved.  * Conjunctivitis started Ciloxan eyedrops  * Anemia; most likely due to anemia of chronic disease. 1 unit packed RBCs transfused on 03/08/2019  *DVT prophylaxis with Lovenox  CODE STATUS: DNR  TOTAL TIME TAKING CARE OF THIS PATIENT: 34 minutes.   Neita Carp M.D on 03/11/2019 at 4:36 PM  Between 7am to 6pm - Pager - [phone]  After 6pm go to [email or web address] - Proofreader  [facility]  Office  [phone]  CC: Primary care physician; Sherrin Daisy, MD  Note: This dictation was prepared with Dragon dictation along with smaller phrase technology. Any transcriptional errors that result from this process are unintentional.

## 2019-03-11 NOTE — Progress Notes (Addendum)
Patient refused to take medications or fluids by mouth. States her mouth hurts.

## 2019-03-11 NOTE — Progress Notes (Signed)
Nutrition Follow-up  DOCUMENTATION CODES:   Not applicable  INTERVENTION:   Once NGT replaced,   Continue Nepro at 35 mL/hr. Provides 1512 kcal, 68 grams of protein, 21 grams of dietary fiber, 613 mL H2O daily.  Continue minimum free water flush of 100 mL Q4hrs per MD.  Recommend measuring daily weights.  Continue MVI daily, B-complex with C daily, vitamin C 250 mg BID.  NUTRITION DIAGNOSIS:   Inadequate oral intake related to acute illness(thrush, nausea, vomiting) as evidenced by meal completion < 25%.  Ongoing - addressing with tube feeds.  GOAL:   Patient will meet greater than or equal to 90% of their needs  Met with tube feeds.  MONITOR:   PO intake, Supplement acceptance, Labs, Weight trends, Skin, I & O's, tube feeds  ASSESSMENT:   78 y.o. female with a history of psoriatic arthritis on methotrexate presented to the emergency room on 02/16/2019 with 1 week history of sore throat, difficulty swallowing, blood in urine and rash.   Pt continues to have poor appetite and oral intake; pt on liquid diet but only eating sips and bites of meals. Pt tolerating tube feeds well. Pt removed NGT on 7/10 and is currently waiting on IR to replace it. Once tube replaced, can restart tube feeds back at goal rate. Several vitamin labs returned low; these are being supplemented. Refeed labs stable. No new weight since 6/20; will request daily weights.   Medications reviewed and include: vitamin C, B-complex with C, dulcolax, ciprofloxacin, lovenox, folic acid, MVI, protonix, thiamine, zosyn   Labs reviewed: Na 148(H), K 3.3(L), BUN 42(H), creat 1.10(H) P 4.9(H)- 7/10 Mg 2.2 wnl- 7/11 Iron 33, TIBC 99(L), folate 33, B12 1351- 7/10 Vitamin C- 0.1(L), niacin 35.9 wnl, B6 1.5(L)- 6/26  Diet Order:   Diet Order            Diet full liquid Room service appropriate? Yes; Fluid consistency: Thin  Diet effective now             EDUCATION NEEDS:   Not appropriate for education at  this time  Skin:  Skin Assessment: Skin Integrity Issues:(MSAD to buttocks and groin)  Last BM:  7/13- type 6  Height:   Ht Readings from Last 1 Encounters:  02/16/19 '5\' 4"'  (1.626 m)   Weight:   Wt Readings from Last 1 Encounters:  02/16/19 59.1 kg   Ideal Body Weight:  54.5 kg  BMI:  Body mass index is 22.35 kg/m.  Estimated Nutritional Needs:   Kcal:  1400-1600kcal/day  Protein:  70-80g/day  Fluid:  >1.4L/day  Koleen Distance MS, RD, LDN Pager #- [phone] Office#- [phone] After Hours Pager: [phone]

## 2019-03-11 NOTE — Progress Notes (Signed)
PHARMACY CONSULT NOTE - FOLLOW UP  Pharmacy Consult for Electrolyte Monitoring and Replacement   Recent Labs: Potassium (mmol/L)  Date Value  03/11/2019 3.9   Magnesium (mg/dL)  Date Value  03/09/2019 2.2   Calcium (mg/dL)  Date Value  03/11/2019 7.6 (L)   Albumin (g/dL)  Date Value  03/10/2019 1.7 (L)   Phosphorus (mg/dL)  Date Value  03/08/2019 4.9 (H)   Sodium (mmol/L)  Date Value  03/11/2019 148 (H)     Assessment: Patient is a 78yo female admitted with neutropenia and stomatitis. Pharmacy consulted for electrolyte replacement. Patient is refusing oral medications at this time, previous order for KCl 65mEq PO times one but  patient spit dose out.   Patient refusing oral meds d/t Severe oral mucositis and thrush   Plan:  Potassium WNL. Furosemide discontinued. Electrolytes ordered with morning labs.   Dorena Bodo, PharmD Clinical Pharmacist

## 2019-03-12 DIAGNOSIS — Z7189 Other specified counseling: Secondary | ICD-10-CM

## 2019-03-12 LAB — BASIC METABOLIC PANEL
Anion gap: 6 (ref 5–15)
BUN: 38 mg/dL — ABNORMAL HIGH (ref 8–23)
CO2: 23 mmol/L (ref 22–32)
Calcium: 7.8 mg/dL — ABNORMAL LOW (ref 8.9–10.3)
Chloride: 118 mmol/L — ABNORMAL HIGH (ref 98–111)
Creatinine, Ser: 1.03 mg/dL — ABNORMAL HIGH (ref 0.44–1.00)
GFR calc Af Amer: >60 mL/min (ref >60)
GFR calc non Af Amer: 52 mL/min — ABNORMAL LOW (ref >60)
Glucose, Bld: 154 mg/dL — ABNORMAL HIGH (ref 70–99)
Potassium: 3.6 mmol/L (ref 3.5–5.1)
Sodium: 147 mmol/L — ABNORMAL HIGH (ref 135–145)

## 2019-03-12 LAB — GLUCOSE, CAPILLARY
Glucose-Capillary: 143 mg/dL — ABNORMAL HIGH (ref 70–99)
Glucose-Capillary: 157 mg/dL — ABNORMAL HIGH (ref 70–99)
Glucose-Capillary: 173 mg/dL — ABNORMAL HIGH (ref 70–99)
Glucose-Capillary: 150 mg/dL — ABNORMAL HIGH (ref 70–99)
Glucose-Capillary: 140 mg/dL — ABNORMAL HIGH (ref 70–99)

## 2019-03-12 MED ORDER — AMLODIPINE BESYLATE 10 MG PO TABS
10.0000 mg | ORAL_TABLET | Freq: Every day | ORAL | Status: DC
  Administered 2019-03-12 – 2019-03-13 (×2): 10 mg via ORAL
  Filled 2019-03-12 (×2): qty 1

## 2019-03-12 NOTE — Progress Notes (Signed)
PHARMACY CONSULT NOTE - FOLLOW UP  Pharmacy Consult for Electrolyte Monitoring and Replacement   Recent Labs: Potassium (mmol/L)  Date Value  03/12/2019 3.6   Magnesium (mg/dL)  Date Value  03/09/2019 2.2   Calcium (mg/dL)  Date Value  03/12/2019 7.8 (L)   Albumin (g/dL)  Date Value  03/10/2019 1.7 (L)   Phosphorus (mg/dL)  Date Value  03/08/2019 4.9 (H)   Sodium (mmol/L)  Date Value  03/12/2019 147 (H)     Assessment: Patient is a 78yo female admitted with neutropenia and stomatitis. Pharmacy consulted for electrolyte replacement. Patient is refusing oral medications at this time, previous order for KCl 51mEq PO times one but  patient spit dose out.  Failure to thrive and severe malnutrition  Plan:  K 3.6   Scr 1.03   (Mag on 7/11= 2.2) Patient refusing oral meds d/t Severe oral mucositis and thrush. Lasix discontinued. No supplementation at this time Follow up with AM labs.  Chinita Greenland PharmD Clinical Pharmacist 03/12/2019

## 2019-03-12 NOTE — Progress Notes (Signed)
Physical Therapy Treatment Patient Details Name: Claudia Case MRN: [id number] DOB: 10-27-1940 Today's Date: 03/12/2019    History of Present Illness 78 year old female that presents with weakness, mouth sores, pain in her hands, and nausea/vomiting. She was found to have pancytopenia, elevated bilirubin levels. She has a history of RA, which has been managed with methotrexate.    PT Comments    Pt presented as confused today, following single step commands inconsistently and requiring manual direction.  Pt pulled at NG tube on two occasions and stopped when directed by PT.  Pt shows fair strength of LE's and is able to perform SLR's, though she requires manual cues for all movement.  When asked if she enjoyed exercise, pt shook her head and said "no" but was pleasantly agreeable to participate as much there ex as possible.  Pt will continue to benefit from skilled PT with focus on strength, safe functional mobility and tolerance to activity.   Follow Up Recommendations  SNF     Equipment Recommendations  Rolling walker with 5" wheels;Wheelchair (measurements PT);Wheelchair cushion (measurements PT);3in1 (PT)    Recommendations for Other Services       Precautions / Restrictions Precautions Precautions: Fall Restrictions Weight Bearing Restrictions: No    Mobility  Bed Mobility Overal bed mobility: (Pt remained in bed throughout treatment.)   Rolling: (No bed mobility performed.)            Transfers                    Ambulation/Gait                 Stairs             Wheelchair Mobility    Modified Rankin (Stroke Patients Only)       Balance                                            Cognition Arousal/Alertness: Awake/alert Behavior During Therapy: WFL for tasks assessed/performed Overall Cognitive Status: Impaired/Different from baseline Area of Impairment: Orientation;Attention                  Orientation Level: Disoriented to;Situation;Time Current Attention Level: Selective Memory: Decreased short-term memory Following Commands: Follows one step commands inconsistently       General Comments: difficulty following 1 step directions at times,  verbal and tactile cues needed.      Exercises General Exercises - Lower Extremity Ankle Circles/Pumps: AROM;10 reps;AAROM Short Arc Quad: Strengthening;10 reps;Both;Supine Heel Slides: Strengthening;10 reps;AAROM;Supine Hip ABduction/ADduction: Strengthening;10 reps;Both;AAROM;Supine Straight Leg Raises: AROM;AAROM;Supine;10 reps Other Exercises Other Exercises: Pillow squeezes AAROM supine x10 with 3 second hold.    General Comments        Pertinent Vitals/Pain      Home Living                      Prior Function            PT Goals (current goals can now be found in the care plan section) Progress towards PT goals: Not progressing toward goals - comment(Pt has shown a decline and hospice care is being considered.  She is willing to participate with therapy but unable to follow commands consistently.)    Frequency    Min 2X/week      PT Plan Current plan  remains appropriate    Co-evaluation              AM-PAC PT "6 Clicks" Mobility   Outcome Measure  Help needed turning from your back to your side while in a flat bed without using bedrails?: A Little Help needed moving from lying on your back to sitting on the side of a flat bed without using bedrails?: A Lot Help needed moving to and from a bed to a chair (including a wheelchair)?: A Lot Help needed standing up from a chair using your arms (e.g., wheelchair or bedside chair)?: A Lot Help needed to walk in hospital room?: A Lot Help needed climbing 3-5 steps with a railing? : Total 6 Click Score: 12    End of Session Equipment Utilized During Treatment: Oxygen Activity Tolerance: Patient tolerated treatment well Patient left: in  bed;with bed alarm set;with call bell/phone within reach   PT Visit Diagnosis: Unsteadiness on feet (R26.81);Muscle weakness (generalized) (M62.81)     Time: 2633-3545 PT Time Calculation (min) (ACUTE ONLY): 11 min  Charges:  $Therapeutic Exercise: 8-22 mins                     Roxanne Gates, PT, DPT    Roxanne Gates 03/12/2019, 3:36 PM

## 2019-03-12 NOTE — Progress Notes (Signed)
[place] [facility] [place] NAME: Celinda Dethlefs    MR#:  [id number]  DATE OF BIRTH:  [birth year]  SUBJECTIVE:  CHIEF COMPLAINT:   Chief Complaint  Patient presents with  . Hematuria  . Emesis   Drowsy  REVIEW OF SYSTEMS:  Review of Systems  Unable to perform ROS: Mental status change    DRUG ALLERGIES:  No Known Allergies VITALS:  Blood pressure (!) 181/79, pulse 77, temperature (!) 97.3 F (36.3 C), temperature source Axillary, resp. rate 18, height 5\' 4"  (1.626 m), weight 59.1 kg, SpO2 100 %. PHYSICAL EXAMINATION:   Physical Exam  HENT:  Head: Normocephalic.  Right Ear: External ear normal.  Inflammed oral mucosa  Eyes: Pupils are equal, round, and reactive to light. Conjunctivae are normal. Right eye exhibits no discharge.  Neck: Normal range of motion. Neck supple. No thyromegaly present.  Cardiovascular: Normal rate, regular rhythm and normal heart sounds.  Respiratory: Effort normal and breath sounds normal.  GI: Soft. Bowel sounds are normal. She exhibits no distension. There is no abdominal tenderness.  Musculoskeletal: Normal range of motion.        General: No edema.     Comments: Wrist and ankle swelling  Neurological:  confused  Skin: She is not diaphoretic. No erythema.  Psychiatric:  Drowsy   LABORATORY PANEL:  Female CBC Recent Labs  Lab 03/09/19 0349  WBC 13.7*  HGB 8.0*  HCT 24.8*  PLT 259   ------------------------------------------------------------------------------------------------------------------ Chemistries  Recent Labs  Lab 03/09/19 0349  03/12/19 0406  NA 149*   < > 147*  K 3.2*   < > 3.6  CL 120*   < > 118*  CO2 23   < > 23  GLUCOSE 136*   < > 154*  BUN 62*   < > 38*  CREATININE 1.35*   < > 1.03*  CALCIUM 7.9*   < > 7.8*  MG 2.2  --   --    < > = values in this interval not displayed.   RADIOLOGY:  No results found. ASSESSMENT AND PLAN:   * Tachypnea with acute pulmonary edema  and fluid overload.  Patient being diuresed with IV Lasix.  On empiric antibiotics with IV Zosyn to cover for probable pneumonia.  CTA chest not done due to renal failure.  VQ scan not done because patient unable to lay flat.  Bilateral lower extremity venous Doppler ultrasound negative for DVT.  Clinical probably for PE very low.   Chest x-ray continues to show pulmonary edema and pleural effusions Unfortunately patient seems to be getting intravascular volume depletion.  Lasix held by nephrology. We will continue IV Zosyn for total 10-day course  *Hypernatremia.  Free water through [place].  * Severe oral mucositis and thrush.  Continue nystatin, Viscous Lidocaine and chlorhexidine.  * Failure to thrive and severe malnutrition.:  Patient had pulled Dobbhoff out.  Replaced today by IR.  Appreciate help.  * Acute kidney injury and dehydration.    Being followed by nephrologist.  Felt to be likely due to dehydration.  Due to recent fluid overload patient was diuresed with Lasix.  * Hyperkalemia.  Better after Lasix  * Neutropenic fever on admission secondary to methotrexate.  History of psoriatic arthritis for which patient was previously on methotrexate. Finished broad-spectrum antibiotics.  Neutropenia resolved.  Remains afebrile.  * Pancytopenia secondary to methotrexate toxicity.  Status post leucovorin and platelet transfusion.improved.  *Elevated liver function tests and jaundice.  improved.  * Hypernatremia.  This has improved.  * Conjunctivitis started Ciloxan eyedrops  * Anemia; most likely due to anemia of chronic disease. 1 unit packed RBCs transfused on 03/08/2019  *DVT prophylaxis with Lovenox  Appreciate palliative care discussing with family today.  Plan is for residential hospice versus hospice home most likely in 1 to 2 days per family's decisions.  CODE STATUS: DNR  TOTAL TIME TAKING CARE OF THIS PATIENT: 34 minutes.   Orie FishermanSrikar R Alida Greiner M.D on 03/12/2019 at 3:01  PM  Between 7am to 6pm - Pager - [phone]  After 6pm go to [email or web address] - Social research officer, governmentpassword EPAS [facility]  [facility]  Office  [phone]  CC: Primary care physician; Sula RumpleVirk, Charanjit, MD  Note: This dictation was prepared with Dragon dictation along with smaller phrase technology. Any transcriptional errors that result from this process are unintentional.

## 2019-03-12 NOTE — Progress Notes (Signed)
[facility]  ROUNDING NOTE   Subjective:   Patient is doing fiar No acute c/o  Has soft mittens Currently getting NG tube feeds 35 cc/h  Objective:  Vital signs in last 24 hours:  Temp:  [97.3 F (36.3 C)-98 F (36.7 C)] 97.3 F (36.3 C) (07/14 0409) Pulse Rate:  [77-87] 77 (07/14 0409) Resp:  [18] 18 (07/13 1529) BP: (155-181)/(78-87) 181/79 (07/14 0409) SpO2:  [98 %-100 %] 100 % (07/14 0409)  Weight change:  Filed Weights   02/16/19 1502 02/16/19 2158  Weight: 60.8 kg 59.1 kg    Intake/Output: I/O last 3 completed shifts: In: 1702.7 [P.O.:400; I.V.:12.7; NG/GT:420; IV Piggyback:870] Out: 400 [Urine:400]   Intake/Output this shift:  Total I/O In: 103.7 [I.V.:53.7; IV Piggyback:50] Out: -   Physical Exam: General: NAD,   Head: +stomatitis  Eyes: Anicteric,   Neck: Supple,  Lungs:   Nasal cannula oxygen, shallow breathing effort  Heart: Regular rate and rhythm  Abdomen:  Soft, nontender,   Extremities: + peripheral edema.  Neurologic:  Arousable, confused  Skin: No lesions       Basic Metabolic Panel: Recent Labs  Lab 03/07/19 0314 03/08/19 0640 03/09/19 0349 03/10/19 0933 03/11/19 0540 03/11/19 1752 03/12/19 0406  NA 145 147* 149* 146* 148*  --  147*  K 3.9 3.7 3.2* 3.1* 3.3* 3.9 3.6  CL 117* 120* 120* 116* 120*  --  118*  CO2 19* 21* 23 24 24   --  23  GLUCOSE 172* 175* 136* 122* 91  --  154*  BUN 71* 67* 62* 47* 42*  --  38*  CREATININE 1.46* 1.36* 1.35* 1.23* 1.10*  --  1.03*  CALCIUM 8.1* 7.9* 7.9* 7.6* 7.6*  --  7.8*  MG 2.2 2.2 2.2  --   --   --   --   PHOS 5.3* 4.9*  --   --   --   --   --     Liver Function Tests: Recent Labs  Lab 03/10/19 0933  ALBUMIN 1.7*   No results for input(s): LIPASE, AMYLASE in the last 168 hours. No results for input(s): AMMONIA in the last 168 hours.  CBC: Recent Labs  Lab 03/06/19 0440 03/07/19 0314 03/08/19 0640 03/09/19 0349  WBC 11.6* 12.7* 13.9* 13.7*  HGB 7.1* 7.3* 6.9*  8.0*  HCT 22.9* 23.6* 21.8* 24.8*  MCV 111.2* 110.8* 109.0* 101.6*  PLT 290 299 294 259    Cardiac Enzymes: No results for input(s): CKTOTAL, CKMB, CKMBINDEX, TROPONINI in the last 168 hours.  BNP: Invalid input(s): POCBNP  CBG: Recent Labs  Lab 03/11/19 0819 03/11/19 1211 03/12/19 0355 03/12/19 0802 03/12/19 1154  GLUCAP 89 87 143* 157* 173*    Microbiology: Results for orders placed or performed during the hospital encounter of 02/16/19  Culture, blood (Routine x 2)     Status: None   Collection Time: 02/16/19  3:29 PM   Specimen: BLOOD  Result Value Ref Range Status   Specimen Description BLOOD LEFT ANTECUBITAL  Final   Special Requests   Final    BOTTLES DRAWN AEROBIC AND ANAEROBIC Blood Culture results may not be optimal due to an excessive volume of blood received in culture bottles   Culture   Final    NO GROWTH 5 DAYS Performed at [facility], [address] [place], [place] [address]    Report Status 02/25/2019 FINAL  Final  Culture, blood (Routine x 2)     Status: Abnormal  Collection Time: 02/16/19  4:40 PM   Specimen: BLOOD  Result Value Ref Range Status   Specimen Description   Final    BLOOD RIGHT ANTECUBITAL Performed at [facility], [place] [address], [place], [place] [address]    Special Requests   Final    BOTTLES DRAWN AEROBIC AND ANAEROBIC Blood Culture adequate volume Performed at [facility], [address] [place], [place] [address]    Culture  Setup Time   Final    GRAM POSITIVE COCCI ANAEROBIC BOTTLE ONLY CRITICAL RESULT CALLED TO, READ BACK BY AND VERIFIED WITH: Rayna Sexton ON 02/17/2019 AT 1051 QSD    Culture (A)  Final    STAPHYLOCOCCUS SPECIES (COAGULASE NEGATIVE) THE SIGNIFICANCE OF ISOLATING THIS ORGANISM FROM A SINGLE SET OF BLOOD CULTURES WHEN MULTIPLE SETS ARE DRAWN IS UNCERTAIN. PLEASE NOTIFY THE MICROBIOLOGY DEPARTMENT WITHIN ONE WEEK IF SPECIATION AND SENSITIVITIES ARE REQUIRED. Performed at  [facility], [place] [address], [place], [place] [address]    Report Status 02/19/2019 FINAL  Final  Urine culture     Status: None   Collection Time: 02/16/19  4:40 PM   Specimen: Urine, Random  Result Value Ref Range Status   Specimen Description   Final    URINE, RANDOM Performed at [facility], [address] [place], [place] [address]    Special Requests   Final    NONE Performed at [facility], [address] [place],  [address]    Culture   Final    NO GROWTH Performed at [facility], [place] [address] [place],  [address]    Report Status 02/18/2019 FINAL  Final  Blood Culture ID Panel (Reflexed)     Status: Abnormal   Collection Time: 02/16/19  4:40 PM  Result Value Ref Range Status   Enterococcus species NOT DETECTED NOT DETECTED Final   Listeria monocytogenes NOT DETECTED NOT DETECTED Final   Staphylococcus species DETECTED (A) NOT DETECTED Final    Comment: Methicillin (oxacillin) susceptible coagulase negative staphylococcus. Possible blood culture contaminant (unless isolated from more than one blood culture draw or clinical case suggests pathogenicity). No antibiotic treatment is indicated for blood  culture contaminants. CRITICAL RESULT CALLED TO, READ BACK BY AND VERIFIED WITH: Rayna Sexton ON 02/17/2019 AT 1051 QSD    Staphylococcus aureus (BCID) NOT DETECTED NOT DETECTED Final   Methicillin resistance NOT DETECTED NOT DETECTED Final   Streptococcus species NOT DETECTED NOT DETECTED Final   Streptococcus agalactiae NOT DETECTED NOT DETECTED Final   Streptococcus pneumoniae NOT DETECTED NOT DETECTED Final   Streptococcus pyogenes NOT DETECTED NOT DETECTED Final   Acinetobacter baumannii NOT DETECTED NOT DETECTED Final   Enterobacteriaceae species NOT DETECTED NOT DETECTED Final   Enterobacter cloacae complex NOT DETECTED NOT DETECTED Final   Escherichia coli NOT DETECTED NOT DETECTED Final   Klebsiella oxytoca NOT  DETECTED NOT DETECTED Final   Klebsiella pneumoniae NOT DETECTED NOT DETECTED Final   Proteus species NOT DETECTED NOT DETECTED Final   Serratia marcescens NOT DETECTED NOT DETECTED Final   Haemophilus influenzae NOT DETECTED NOT DETECTED Final   Neisseria meningitidis NOT DETECTED NOT DETECTED Final   Pseudomonas aeruginosa NOT DETECTED NOT DETECTED Final   Candida albicans NOT DETECTED NOT DETECTED Final   Candida glabrata NOT DETECTED NOT DETECTED Final   Candida krusei NOT DETECTED NOT DETECTED Final   Candida parapsilosis NOT DETECTED NOT DETECTED Final   Candida tropicalis NOT DETECTED NOT DETECTED  Final    Comment: Performed at [facility], [address] [place], [place] [address]  SARS Coronavirus 2 (CEPHEID- Performed in [facility] hospital lab), Hosp Order     Status: None   Collection Time: 02/16/19  4:42 PM   Specimen: Nasopharyngeal Swab  Result Value Ref Range Status   SARS Coronavirus 2 NEGATIVE NEGATIVE Final    Comment: (NOTE) If result is NEGATIVE SARS-CoV-2 target nucleic acids are NOT DETECTED. The SARS-CoV-2 RNA is generally detectable in upper and lower  respiratory specimens during the acute phase of infection. The lowest  concentration of SARS-CoV-2 viral copies this assay can detect is 250  copies / mL. A negative result does not preclude SARS-CoV-2 infection  and should not be used as the sole basis for treatment or other  patient management decisions.  A negative result may occur with  improper specimen collection / handling, submission of specimen other  than nasopharyngeal swab, presence of viral mutation(s) within the  areas targeted by this assay, and inadequate number of viral copies  (<250 copies / mL). A negative result must be combined with clinical  observations, patient history, and epidemiological information. If result is POSITIVE SARS-CoV-2 target nucleic acids are DETECTED. The SARS-CoV-2 RNA is generally detectable in upper and  lower  respiratory specimens dur ing the acute phase of infection.  Positive  results are indicative of active infection with SARS-CoV-2.  Clinical  correlation with patient history and other diagnostic information is  necessary to determine patient infection status.  Positive results do  not rule out bacterial infection or co-infection with other viruses. If result is PRESUMPTIVE POSTIVE SARS-CoV-2 nucleic acids MAY BE PRESENT.   A presumptive positive result was obtained on the submitted specimen  and confirmed on repeat testing.  While 2019 novel coronavirus  (SARS-CoV-2) nucleic acids may be present in the submitted sample  additional confirmatory testing may be necessary for epidemiological  and / or clinical management purposes  to differentiate between  SARS-CoV-2 and other Sarbecovirus currently known to infect humans.  If clinically indicated additional testing with an alternate test  methodology [id number]) is advised. The SARS-CoV-2 RNA is generally  detectable in upper and lower respiratory sp ecimens during the acute  phase of infection. The expected result is Negative. Fact Sheet for Patients:  BoilerBrush.com.cyhttps://www.fda.gov/media/136312/download Fact Sheet for Healthcare Providers: https://pope.com/https://www.fda.gov/media/136313/download This test is not yet approved or cleared by the [place] FDA and has been authorized for detection and/or diagnosis of SARS-CoV-2 by FDA under an Emergency Use Authorization (EUA).  This EUA will remain in effect (meaning this test can be used) for the duration of the COVID-19 declaration under Section 564(b)(1) of the Act, 21 U.S.C. section 360bbb-3(b)(1), unless the authorization is terminated or revoked sooner. Performed at [facility], [address] [place], [place] [address]   Culture, blood (single) w Reflex to ID Panel     Status: None   Collection Time: 02/18/19 12:14 PM   Specimen: BLOOD  Result Value Ref Range Status   Specimen  Description BLOOD BLOOD LEFT ARM  Final   Special Requests   Final    BOTTLES DRAWN AEROBIC AND ANAEROBIC Blood Culture results may not be optimal due to an excessive volume of blood received in culture bottles   Culture   Final    NO GROWTH 5 DAYS Performed at [facility], [address] [place], [place] [address]    Report Status 02/23/2019 FINAL  Final  Coagulation Studies: No results for input(s): LABPROT, INR in the last 72 hours.  Urinalysis: No results for input(s): COLORURINE, LABSPEC, PHURINE, GLUCOSEU, HGBUR, BILIRUBINUR, KETONESUR, PROTEINUR, UROBILINOGEN, NITRITE, LEUKOCYTESUR in the last 72 hours.  Invalid input(s): APPERANCEUR    Imaging: Dg Naso G Tube Plc W/fl W/rad  Result Date: 03/11/2019 CLINICAL DATA:  NG tube placement EXAM: NASO G TUBE PLACEMENT WITH FL AND WITH RAD FLUOROSCOPY TIME:  Fluoroscopy Time:  Less than 1 seconds Radiation Exposure Index (if provided by the fluoroscopic device): Less than 0.1 mGy Number of Acquired Spot Images: 0 COMPARISON:  None. FINDINGS: Real-time fluoroscopy was utilized for placement of a Dobbhoff tube. Dobbhoff tube was inserted through the left nare without difficulty. Large hiatal hernia. Dobbhoff tube coiled within the hiatal hernia. Tip of the Dobbhoff tube could not be advanced into the small bowel after multiple attempts at manipulation of the tube. The tube was left in place with the tip within hiatal hernia. Nasogastric tube was secured to the nose with tape. IMPRESSION: Dobbhoff tube placement with the tip in the stomach within a large hiatal hernia. Electronically Signed   By: Elige KoHetal  Patel   On: 03/11/2019 09:37     Medications:   . sodium chloride 10 mL/hr at 03/12/19 1126  . piperacillin-tazobactam (ZOSYN)  IV Stopped (03/12/19 0843)   . amLODipine  10 mg Oral Daily  . ascorbic acid  250 mg Per Tube BID  . B-complex with vitamin C  1 tablet Oral Daily  . bisacodyl  10 mg Rectal Daily  . ciprofloxacin   1 drop Both Eyes Q4H while awake  . enoxaparin (LOVENOX) injection  40 mg Subcutaneous Q24H  . feeding supplement (NEPRO CARB STEADY)  1,000 mL Per Tube Q24H  . folic acid  1 mg Oral Daily  . free water  100 mL Per Tube Q4H  . miconazole   Topical Daily  . multivitamin with minerals  1 tablet Oral Daily  . nystatin   Topical BID  . pantoprazole (PROTONIX) IV  40 mg Intravenous Q12H  . tamsulosin  0.4 mg Oral Daily  . thiamine  100 mg Oral Daily   sodium chloride, acetaminophen **OR** acetaminophen, acetaminophen, hydrALAZINE, iopamidol, lidocaine, magic mouthwash **AND** lidocaine, lip balm, ondansetron **OR** ondansetron (ZOFRAN) IV, senna, sodium chloride flush, sodium chloride flush  Assessment/ Plan:  Ms. Gwendolyn FillLelia Ann Case is a 78 y.o. white female with osteoarthritis, GERD, hyperlipidemia, hypertension, psoriatic arthritis, who [facility]16[facility]2019 for evaluation of severe stomatitis in response to methotrexate.  1.  Acute renal failure, likely ATN 2.  Hypokalemia 3.  Hypernatremia:  3.  Metabolic acidosis  4.  Drug reaction with severe stomatitis secondary to methotrexate. 5.  Anemia with renal failure.  6.  Peripheral edema  Plan: Oral intake is poor palliative care discussion in progress free water 100 mL every 4 hours.   Na improved to 147 with free water Overall prognosis poor    LOS: 24 Tera Pellicane    7/14/202012:19 PM

## 2019-03-12 NOTE — Progress Notes (Signed)
Palliative: Mrs. Claudia Case, is resting quietly in bed.  She will make but not keep eye contact. She is oriented to self only, and appears quite frail and ill.  4 living sisters are present at bedside. We go to a meeting room for a family conference.   Sisters present for meeting Tennis Must, [place], Bonnita Nasuti and [place].   We talk about Ann's prior functional status prior to this illness (90%), Her prior health history (limited chronic disease burden), her work life ([employer]), her husband passed in 2008.   We talk in detail about Ann's acute health issues, we review her labs in detail (albumin 1.7, chemistry, CBC). We talk about her continued confusion, and poor by mouth intake.  Lelon Frohlich would only eat a few bites for sisters, not enough to keep her alive.  We talk about 24 days in hospital with no meaningful improvement.  We talk about how to make choices for loved ones including 1) keeping her at the center of decision making, 2) are we doing something TO her or FOR her, can we change what is happening? 3) the person Lelon Frohlich was 3 months ago, how would she tell you to care for her now.  All sisters agree that Lelon Frohlich would not want to live in this condition.   We talk about prognosis with permission. I share that without artificial hydration and nutrition, less than one week is expected.    Family is considering home with hospice to let nature take it's course, versus residential hospice.  Time offered for family to decide as a team.   Family is divided with [name] and Bonnita Nasuti wanting to respect Ann's wish to go home, and Alta Corning and Joaquim Lai leaning towards residential hospice.  We talk about what hospice offers at each location.  We talk about the need for 24/7 care at home, and [place] share that they have "someone" who can help, but they are unwilling to name this person.   [name] states, "I live right next door", and I share that Lelon Frohlich cannot be left alone.   POA paperwork re-reviewed.  Section 7 of  legal POA paperwork gives sister Tennis Must rights to manage healthcare, paperwork noted in paper chart listing sister Tennis Must as Air traffic controller. Call to attorney office.  All sisters have seen this paperwork.   Health-care POA:  Lala Dameron  Plan:  24-48 hours for desion making. Sisters Lala Lenox) and Joaquim Lai are interested in residential hospice.  They ask for "a few days".   Conference with bedside nursing staff and attending.   150 minutes, extended time.  Quinn Axe, NP Palliative Medicine Team Team Phone # [phone] Greater than 50% of this time was spent counseling and coordinating care related to the above assessment and plan.

## 2019-03-13 LAB — BASIC METABOLIC PANEL
Anion gap: 6 (ref 5–15)
BUN: 26 mg/dL — ABNORMAL HIGH (ref 8–23)
CO2: 24 mmol/L (ref 22–32)
Calcium: 7.7 mg/dL — ABNORMAL LOW (ref 8.9–10.3)
Chloride: 118 mmol/L — ABNORMAL HIGH (ref 98–111)
Creatinine, Ser: 0.7 mg/dL (ref 0.44–1.00)
GFR calc Af Amer: >60 mL/min (ref >60)
GFR calc non Af Amer: >60 mL/min (ref >60)
Glucose, Bld: 102 mg/dL — ABNORMAL HIGH (ref 70–99)
Potassium: 3.7 mmol/L (ref 3.5–5.1)
Sodium: 148 mmol/L — ABNORMAL HIGH (ref 135–145)

## 2019-03-13 LAB — GLUCOSE, CAPILLARY
Glucose-Capillary: 106 mg/dL — ABNORMAL HIGH (ref 70–99)
Glucose-Capillary: 115 mg/dL — ABNORMAL HIGH (ref 70–99)
Glucose-Capillary: 96 mg/dL (ref 70–99)
Glucose-Capillary: 97 mg/dL (ref 70–99)

## 2019-03-13 NOTE — Progress Notes (Signed)
Noted pump beeping saying clogged tube, attempted to flush tube out but tube clogged and unable to flush, attempted several times to unclog with no success, Dr Darvin Neighbours aware states to try again to unclog if no success then discontinue tube

## 2019-03-13 NOTE — Progress Notes (Signed)
PHARMACY CONSULT NOTE - FOLLOW UP  Pharmacy Consult for Electrolyte Monitoring and Replacement   Recent Labs: Potassium (mmol/L)  Date Value  03/13/2019 3.7   Magnesium (mg/dL)  Date Value  03/09/2019 2.2   Calcium (mg/dL)  Date Value  03/13/2019 7.7 (L)   Albumin (g/dL)  Date Value  03/10/2019 1.7 (L)   Phosphorus (mg/dL)  Date Value  03/08/2019 4.9 (H)   Sodium (mmol/L)  Date Value  03/13/2019 148 (H)     Assessment: Patient is a 78yo female admitted with neutropenia and stomatitis. Pharmacy consulted for electrolyte replacement. Patient is refusing oral medications at this time, previous order for KCl 32mEq PO times one but  patient spit dose out.  Failure to thrive and severe malnutrition  Plan:  K 3.7   Scr 0.70   (Mag on 7/11= 2.2) Patient off/on refusing oral meds d/t Severe oral mucositis and thrush. No supplementation at this time Follow up with AM labs.  Chinita Greenland PharmD Clinical Pharmacist 03/13/2019

## 2019-03-13 NOTE — Care Management Important Message (Signed)
Important Message  Patient Details  Name: Claudia Case MRN: [id number] Date of Birth: Mar 21, 1941   Medicare Important Message Given:  Yes     [name] 03/13/2019, 11:10 AM

## 2019-03-13 NOTE — Progress Notes (Signed)
   [place] at [place] NAME: Claudia Case    MR#:  [id number]  DATE OF BIRTH:  09/02/1940  SUBJECTIVE:  CHIEF COMPLAINT:   Chief Complaint  Patient presents with  . Hematuria  . Emesis   Patient is awake and alert.  Confused.  Dobbhoff was clogged and had to be removed.  Poor oral intake  REVIEW OF SYSTEMS:  Review of Systems  Unable to perform ROS: Mental status change    DRUG ALLERGIES:  No Known Allergies VITALS:  Blood pressure (!) 184/67, pulse 73, temperature 97.8 F (36.6 C), resp. rate 18, height 5\' 4"  (1.626 m), weight 59.1 kg, SpO2 100 %. PHYSICAL EXAMINATION:   Physical Exam  HENT:  Head: Normocephalic.  Right Ear: External ear normal.  Inflammed oral mucosa  Eyes: Pupils are equal, round, and reactive to light. Conjunctivae are normal. Right eye exhibits no discharge.  Neck: Normal range of motion. Neck supple. No thyromegaly present.  Cardiovascular: Normal rate, regular rhythm and normal heart sounds.  Respiratory: Effort normal and breath sounds normal.  GI: Soft. Bowel sounds are normal. She exhibits no distension. There is no abdominal tenderness.  Musculoskeletal: Normal range of motion.        General: No edema.     Comments: Wrist and ankle swelling  Neurological:  confused  Skin: She is not diaphoretic. No erythema.  Psychiatric:  Drowsy   LABORATORY PANEL:  Female CBC Recent Labs  Lab 03/09/19 0349  WBC 13.7*  HGB 8.0*  HCT 24.8*  PLT 259   ------------------------------------------------------------------------------------------------------------------ Chemistries  Recent Labs  Lab 03/09/19 0349  03/13/19 0629  NA 149*   < > 148*  K 3.2*   < > 3.7  CL 120*   < > 118*  CO2 23   < > 24  GLUCOSE 136*   < > 102*  BUN 62*   < > 26*  CREATININE 1.35*   < > 0.70  CALCIUM 7.9*   < > 7.7*  MG 2.2  --   --    < > = values in this interval not displayed.   RADIOLOGY:  No results found.  ASSESSMENT AND PLAN:   * Tachypnea with acute pulmonary edema and fluid overload.  And aspiration pneumonia Was treated with IV Zosyn. *Hypernatremia.   * Severe oral mucositis and thrush.  * Failure to thrive and severe malnutrition.:  * Acute kidney injury and dehydration.  * Hyperkalemia.  * Neutropenic fever *  psoriatic arthritis * Pancytopenia secondary to methotrexate toxicity.   *Elevated liver function tests and jaundice.   improved. * Hypernatremia.  T * Conjunctivitis * Anemia;  Palliative care has discussed with family.  Patient being transitioned to comfort measures today.  CODE STATUS: DNR  TOTAL TIME TAKING CARE OF THIS PATIENT: 25 minutes.   Leia Alf Karn Derk M.D on 03/13/2019 at 1:48 PM  Between 7am to 6pm - Pager - [phone]  After 6pm go to [email or web address] - Proofreader  [facility] [place] [facility]  Office  [phone]  CC: Primary care physician; Sherrin Daisy, MD  Note: This dictation was prepared with Dragon dictation along with smaller phrase technology. Any transcriptional errors that result from this process are unintentional.

## 2019-03-13 NOTE — Progress Notes (Signed)
Palliative: Claudia Case, is resting quietly in bed.  She will make but not keep eye contact.  She appears ill and frail.  She is, again today, oriented to person only. Her temporary feeding tube has become clogged and had to be removed.   There is no family at bedside at this time.   Request received to call niece, Claudia Natal, NP at [phone].  Claudia Case is sister Claudia Case daughter.  We talked about Claudia Case's current health concerns, her clogged feeding tube, her lack of nutrition and hydration, the likelihood of compromised kidney function due to poor hydration, and refusal to take in adequate amounts of by mouth food and liquid.  We talked about some labs, specifically albumin at 1.7.  Claudia Case asks about albumin supplementation via IV, and I share that this is a temporary treatment that would not change outcomes including her encephalopathy.  Claudia Case shares that she will contact her mother Claudia Case and her aunt Claudia Case to update them.  Call to Claudia Case.  We talked about malfunctioning feeding tube being removed.  We talked about continued frailty and encephalopathy.  We also talked about poor nutrition and hydration, Claudia Case's declining to take enough food and liquid by mouth.  We talked about expected decline in kidney function.  We talked about PEG tube placement, and Claudia Case shares that Claudia Case would not want a feeding tube, she will not do that to her sister.  We talked about 24 days of specialized hospital care is not changing what is happening.  We talked about comfort measures, unburdening Claudia Case from medications and treatments that are painful.  Claudia Case is agreeable to focus on comfort, she will consider disposition to home with hospice versus residential hospice. Orders made.   Claudia Case and I have much discussion related to family dynamics, psychosocial issues between family members.  Several times Claudia Case asks for what would be considered legal advice.  I encouraged her multiple times to reach out to an  attorney for advice.  She asks on 2 occasions for me to "write a letter", one of which would state that if Claudia Case and Claudia Case take and to her home to pass that they must provide specific care.  I share with Claudia Case that I cannot write letters specifying care.  We talked about home with hospice versus residential hospice, sharing that the medical team recommends residential hospice.  I share that we must have a safe discharge plan with specifics related to 24/7 care for Claudia Case.  Family is torn in that Claudia Case wants to return to her own home, and I share with Claudia Case that at this point Claudia Case does not know where she is.  Claudia Case shares that she will reach out to [place] for support and questions. Conference with attending, bedside nursing, case management.  Plan: NO PEG tube, Full comfort care, HCPOA/sister Claudia Case is considering disposition. PMT to follow tomorrow.   75 minutes, extended time.  Quinn Axe, NP Palliative Medicine Team Team Phone # [phone] Greater than 50% of this time was spent counseling and coordinating care related to the above assessment and plan.

## 2019-03-13 NOTE — Progress Notes (Signed)
Nutrition Brief Note  Chart reviewed. Pt now transitioning to comfort care.  No further nutrition interventions warranted at this time.  Please re-consult as needed.   Laurier Jasperson MS, RD, LDN Pager #- [phone] Office#- [phone] After Hours Pager: [phone]    

## 2019-03-14 MED ORDER — LORAZEPAM 2 MG/ML PO CONC: 1.0000 mg | Freq: Four times a day (QID) | ORAL | 0 refills | Status: AC | PRN

## 2019-03-14 MED ORDER — LORAZEPAM 2 MG/ML PO CONC
1.0000 mg | Freq: Four times a day (QID) | ORAL | Status: DC | PRN
  Administered 2019-03-14: 1 mg via ORAL
  Filled 2019-03-14: qty 1

## 2019-03-14 MED ORDER — LORAZEPAM 2 MG/ML IJ SOLN: 0.5000 mg | INTRAMUSCULAR | Status: DC | PRN

## 2019-03-14 MED ORDER — BISACODYL 10 MG RE SUPP: 10.0000 mg | Freq: Every day | RECTAL | Status: DC | PRN

## 2019-03-14 MED ORDER — MORPHINE SULFATE (CONCENTRATE) 10 MG/0.5ML PO SOLN: 2.6000 mg | ORAL | Status: AC | PRN

## 2019-03-14 MED ORDER — MORPHINE SULFATE (PF) 2 MG/ML IV SOLN
2.0000 mg | INTRAVENOUS | Status: DC | PRN
  Administered 2019-03-15: 2 mg via INTRAVENOUS
  Filled 2019-03-14: qty 1

## 2019-03-14 MED ORDER — MORPHINE SULFATE (CONCENTRATE) 10 MG/0.5ML PO SOLN
2.6000 mg | ORAL | Status: DC | PRN
  Administered 2019-03-14: 20:00:00 5 mg via ORAL
  Filled 2019-03-14: qty 1

## 2019-03-14 NOTE — TOC Progression Note (Signed)
Transition of Care [facility]) - Progression Note    Patient Details  Name: Claudia Case MRN: [id number] Date of Birth: [birth year]  Transition of Care [facility]) CM/SW Contact  Munirah Doerner, [name] Phone Number: [phone]  03/14/2019, 11:30 AM  Clinical Narrative: Clinical Social Worker (CSW) received consult to arrange residential hospice placement. Per palliative NP patient's sister Claudia Case is HPOA for patient. CSW contacted Claudia Case and provided hospice agency choice and she chose [facility] on [facility] in [place]. CSW sent message to [facility] liaison making her aware of above. CSW will continue to follow and assist as needed.     Expected Discharge Plan: [place]    Expected Discharge Plan and Services Expected Discharge Plan: [place]   Discharge Planning Services: CM Consult Post Acute Care Choice: [place] arrangements for the past 2 months: Single Family Home                                       Social Determinants of Health (SDOH) Interventions    Readmission Risk Interventions No flowsheet data found.

## 2019-03-14 NOTE — Progress Notes (Signed)
New referral for [place] home received from Rosa Sanchez. There is not a bed available today, Mel Almond aware. Writer called patient's sister and POA Tennis Must to discuss services. Lala in agreement for transfer to the hospice home tomorrow, pending consents. Patient information faxed to referral. Flo Shanks BSN, RN, [facility] Liaison [facility] hospice [phone]

## 2019-03-14 NOTE — Progress Notes (Signed)
   [place] at [place] NAME: Claudia Case    MR#:  [id number]  DATE OF BIRTH:  1941-06-14  SUBJECTIVE:  CHIEF COMPLAINT:   Chief Complaint  Patient presents with  . Hematuria  . Emesis   [place].  REVIEW OF SYSTEMS:  Review of Systems  Unable to perform ROS: Mental status change    DRUG ALLERGIES:  No Known Allergies VITALS:  Blood pressure (!) 170/80, pulse 66, temperature 97.9 F (36.6 C), temperature source Oral, resp. rate 12, height 5\' 4"  (1.626 m), weight 59.1 kg, SpO2 100 %. PHYSICAL EXAMINATION:   Physical Exam  HENT:  Head: Normocephalic.  Right Ear: External ear normal.  Inflammed oral mucosa  Eyes: Pupils are equal, round, and reactive to light. Conjunctivae are normal. Right eye exhibits no discharge.  Neck: Normal range of motion. Neck supple. No thyromegaly present.  Cardiovascular: Normal rate, regular rhythm and normal heart sounds.  Respiratory: Effort normal and breath sounds normal.  GI: Soft. Bowel sounds are normal. She exhibits no distension. There is no abdominal tenderness.  Musculoskeletal: Normal range of motion.        General: No edema.     Comments: Wrist and ankle swelling  Neurological:  confused  Skin: She is not diaphoretic. No erythema.  Psychiatric:  Drowsy   LABORATORY PANEL:  Female CBC Recent Labs  Lab 03/09/19 0349  WBC 13.7*  HGB 8.0*  HCT 24.8*  PLT 259   ------------------------------------------------------------------------------------------------------------------ Chemistries  Recent Labs  Lab 03/09/19 0349  03/13/19 0629  NA 149*   < > 148*  K 3.2*   < > 3.7  CL 120*   < > 118*  CO2 23   < > 24  GLUCOSE 136*   < > 102*  BUN 62*   < > 26*  CREATININE 1.35*   < > 0.70  CALCIUM 7.9*   < > 7.7*  MG 2.2  --   --    < > = values in this interval not displayed.   RADIOLOGY:  No results found. ASSESSMENT AND PLAN:   * Tachypnea with acute pulmonary edema and  fluid overload.  And aspiration pneumonia Was treated with IV Zosyn. *Hypernatremia.   * Severe oral mucositis and thrush.  * Failure to thrive and severe malnutrition.:  * Acute kidney injury and dehydration.  * Hyperkalemia.  * Neutropenic fever *  psoriatic arthritis * Pancytopenia secondary to methotrexate toxicity.   *Elevated liver function tests and jaundice.   improved. * Hypernatremia.  T * Conjunctivitis * Anemia;  Family has chosen hospice home as disposition. Waiting for bed.  CODE STATUS: DNR  TOTAL TIME TAKING CARE OF THIS PATIENT: 25 minutes.   Claudia Case M.D on 03/14/2019 at 1:35 PM  Between 7am to 6pm - Pager - [phone]  After 6pm go to [email or web address] - Proofreader  [facility] [place] [facility]  Office  [phone]  CC: Primary care physician; Sherrin Daisy, MD  Note: This dictation was prepared with Dragon dictation along with smaller phrase technology. Any transcriptional errors that result from this process are unintentional.

## 2019-03-14 NOTE — Progress Notes (Signed)
Palliative: Mrs. Lew Dawes, is resting quietly in bed. She is oriented to self only, non decisional, but able to make her needs known.  Thankfully she denies pain at this time.  There is no family at bedside at this time due to visitor restrictions.  Call to [place], Lela Damron.  Lela states that she has talked with her sisters and she has decided for residential hospice with a [facility], comfort and dignity at end-of-life, let nature take its course.  Conference with attending, bedside nurse, and RN CM.   Lala and her sisters continue to struggle with interpersonal/family conflict about how to care for Lelon Frohlich and her possessions after death.   Plan: Full comfort care, requesting residential hospice in [place].   71 minutes Quinn Axe, NP Palliative Medicine Team Team Phone # [phone] Greater than 50% of this time was spent counseling and coordinating care related to the above assessment and plan.

## 2019-03-14 NOTE — Progress Notes (Signed)
Appetite remain poor, only taking sips and bites with encouragement, showing no interest in eating

## 2019-03-15 NOTE — Discharge Summary (Signed)
[facility] [place] [facility]   PATIENT NAME: Claudia Case    MR#:  [id number]  DATE OF BIRTH:  06-10-1941  DATE OF ADMISSION:  02/16/2019 ADMITTING PHYSICIAN: Alford Highlandichard Wieting, MD  DATE OF DISCHARGE: 03/15/2019  PRIMARY CARE PHYSICIAN: Sula RumpleVirk, Charanjit, MD   ADMISSION DIAGNOSIS:  Bilirubinemia [E80.6] Fever [R50.9]  DISCHARGE DIAGNOSIS:  Active Problems:   Neutropenic fever (HCC)   Failure to thrive in adult   Stomatitis   Goals of care, counseling/discussion   Palliative care by specialist   DNR (do not resuscitate) discussion   Pressure injury of skin   Encounter for hospice care discussion   SECONDARY DIAGNOSIS:   Past Medical History:  Diagnosis Date  . Arthritis   . GERD (gastroesophageal reflux disease)   . Hyperlipidemia   . Hypertension   . Psoriatic arthritis (HCC)      ADMITTING HISTORY  Claudia ShorterLelia Mocarski  is a 78 y.o. female with a known history of rheumatoid arthritis on methotrexate.  She presents to the hospital feeling like she was going to die.  She states that her lips are burning and she wanted to get checked out.  Patient stated she had some nausea and vomiting.  She seemed a little confused and was not the greatest historian.  As per her sister, patient had diarrhea and been lying in bed.  In the ER, the patient was found to have a very low white blood cell count and a fever and a low platelet count.  The ER physician spoke with the laboratory team about the peripheral smear showing no schistocytes. I do not see that report yet in the computer.  Hospitalist services were contacted for further evaluation.  HOSPITAL COURSE:   *Acute hypoxic respiratory failure  acute pulmonary edema and fluid overload secondary to hypoalbuminemia * aspiration pneumonia Was treated with IV Zosyn. *Hypernatremia.   * Severe oral mucositis and thrush.  * Failure to thrive and severe malnutrition.:  * Acute kidney injury and dehydration.  *  Hyperkalemia. * Neutropenic fever *  psoriatic arthritis * Pancytopenia secondary to methotrexate toxicity.  *Elevated liver function tests and jaundice.  improved. * Hypernatremia. T * Conjunctivitis * Anemia *Acute metabolic encephalopathy  Patient was treated aggressively and initially her blood numbers seem to improved.  But patient with significant mucositis was unable to have any nutrition.  Dobbhoff was placed.  But due to patient's confusion she pulled out multiple times and was replaced.  Later palliative care was consulted and patient was transitioned to comfort measures as per family's request due to no improvement.  Patient will be transferred to hospice home today.  CONSULTS OBTAINED:  Treatment Team:  Creig Hinesao, Archana C, MD Kandyce RudKernodle, George W Jr., MD  DRUG ALLERGIES:  No Known Allergies  DISCHARGE MEDICATIONS:   Allergies as of 03/15/2019   No Known Allergies     Medication List    STOP taking these medications   APPLE CIDER VINEGAR PO   Calcium Carbonate-Vitamin D 600-200 MG-UNIT Tabs   cetirizine 10 MG tablet Commonly known as: ZYRTEC   ferrous sulfate 325 (65 FE) MG tablet   folic acid 1 MG tablet Commonly known as: FOLVITE   hydrocortisone 2.5 % ointment   hydroxychloroquine 200 MG tablet Commonly known as: PLAQUENIL   lovastatin 20 MG tablet Commonly known as: MEVACOR   magnesium oxide 400 MG tablet Commonly known as: MAG-OX   methotrexate 2.5 MG tablet Commonly known as: RHEUMATREX   multivitamin with minerals Tabs  tablet   nystatin 100000 UNIT/ML suspension Commonly known as: MYCOSTATIN   Omega 3 1000 MG Caps   omeprazole 40 MG capsule Commonly known as: PRILOSEC   triamcinolone ointment 0.1 % Commonly known as: KENALOG     TAKE these medications   LORazepam 2 MG/ML concentrated solution Commonly known as: ATIVAN Take 0.5-1 mLs (1-2 mg total) by mouth every 6 (six) hours as needed for anxiety or sleep (EOL care).   morphine  CONCENTRATE 10 MG/0.5ML Soln concentrated solution Take 0.13-0.25 mLs (2.6-5 mg total) by mouth every 2 (two) hours as needed for moderate pain or shortness of breath (EOL care).       Today   VITAL SIGNS:  Blood pressure (!) 161/85, pulse 81, temperature 97.6 F (36.4 C), temperature source Oral, resp. rate 20, height  (1.626 m), weight 59.1 kg, SpO2 100 %.  I/O:    Intake/Output Summary (Last 24 hours) at 03/15/2019 1106 Last data filed at 03/15/2019 0600 Gross per 24 hour  Intake -  Output 200 ml  Net -200 ml    PHYSICAL EXAMINATION:  Physical Exam  GENERAL:  78 y.o.-year-old patient lying in the bed with no acute distress.  LUNGS: Normal breath sounds bilaterally, no wheezing, rales,rhonchi or crepitation. No use of accessory muscles of respiration.  CARDIOVASCULAR: S1, S2 normal. No murmurs, rubs, or gallops.  ABDOMEN: Soft, non-tender, non-distended. Bowel sounds present. No organomegaly or mass.  NEUROLOGIC: Moves all 4 extremities. PSYCHIATRIC: The patient is alert and oriented x 3.  SKIN: No obvious rash, lesion, or ulcer.   DATA REVIEW:   CBC Recent Labs  Lab 03/09/19 0349  WBC 13.7*  HGB 8.0*  HCT 24.8*  PLT 259    Chemistries  Recent Labs  Lab 03/09/19 0349  03/13/19 0629  NA 149*   < > 148*  K 3.2*   < > 3.7  CL 120*   < > 118*  CO2 23   < > 24  GLUCOSE 136*   < > 102*  BUN 62*   < > 26*  CREATININE 1.35*   < > 0.70  CALCIUM 7.9*   < > 7.7*  MG 2.2  --   --    < > = values in this interval not displayed.    Cardiac Enzymes No results for input(s): TROPONINI in the last 168 hours.  Microbiology Results  Results for orders placed or performed during the hospital encounter of 02/16/19  Culture, blood (Routine x 2)     Status: None   Collection Time: 02/16/19  3:29 PM   Specimen: BLOOD  Result Value Ref Range Status   Specimen Description BLOOD LEFT ANTECUBITAL  Final   Special Requests   Final    BOTTLES DRAWN AEROBIC AND  ANAEROBIC Blood Culture results may not be optimal due to an excessive volume of blood received in culture bottles   Culture   Final    NO GROWTH 5 DAYS Performed at [facility], [address] [place], [place] [address]    Report Status 02/25/2019 FINAL  Final  Culture, blood (Routine x 2)     Status: Abnormal   Collection Time: 02/16/19  4:40 PM   Specimen: BLOOD  Result Value Ref Range Status   Specimen Description   Final    BLOOD RIGHT ANTECUBITAL Performed at [facility], [address], [place], [place] [address]    Special Requests   Final    BOTTLES DRAWN AEROBIC AND  ANAEROBIC Blood Culture adequate volume Performed at [facility], [address] [place], [place] [address]    Culture  Setup Time   Final    GRAM POSITIVE COCCI ANAEROBIC BOTTLE ONLY CRITICAL RESULT CALLED TO, READ BACK BY AND VERIFIED WITH: Crist FatHANNAH WANG ON 02/17/2019 AT 1051 QSD    Culture (A)  Final    STAPHYLOCOCCUS SPECIES (COAGULASE NEGATIVE) THE SIGNIFICANCE OF ISOLATING THIS ORGANISM FROM A SINGLE SET OF BLOOD CULTURES WHEN MULTIPLE SETS ARE DRAWN IS UNCERTAIN. PLEASE NOTIFY THE MICROBIOLOGY DEPARTMENT WITHIN ONE WEEK IF SPECIATION AND SENSITIVITIES ARE REQUIRED. Performed at [facility], [address] [place], [place] [address]    Report Status 02/19/2019 FINAL  Final  Urine culture     Status: None   Collection Time: 02/16/19  4:40 PM   Specimen: Urine, Random  Result Value Ref Range Status   Specimen Description   Final    URINE, RANDOM Performed at [facility], [address] [place], [place] [address]    Special Requests   Final    NONE Performed at [facility], [address] [place], [place] [address]    Culture   Final    NO GROWTH Performed at [facility], [address] [place], [place] [address]    Report Status 02/18/2019 FINAL  Final  Blood Culture ID Panel (Reflexed)     Status: Abnormal   Collection Time:  02/16/19  4:40 PM  Result Value Ref Range Status   Enterococcus species NOT DETECTED NOT DETECTED Final   Listeria monocytogenes NOT DETECTED NOT DETECTED Final   Staphylococcus species DETECTED (A) NOT DETECTED Final    Comment: Methicillin (oxacillin) susceptible coagulase negative staphylococcus. Possible blood culture contaminant (unless isolated from more than one blood culture draw or clinical case suggests pathogenicity). No antibiotic treatment is indicated for blood  culture contaminants. CRITICAL RESULT CALLED TO, READ BACK BY AND VERIFIED WITH: Crist FatHANNAH WANG ON 02/17/2019 AT 1051 QSD    Staphylococcus aureus (BCID) NOT DETECTED NOT DETECTED Final   Methicillin resistance NOT DETECTED NOT DETECTED Final   Streptococcus species NOT DETECTED NOT DETECTED Final   Streptococcus agalactiae NOT DETECTED NOT DETECTED Final   Streptococcus pneumoniae NOT DETECTED NOT DETECTED Final   Streptococcus pyogenes NOT DETECTED NOT DETECTED Final   Acinetobacter baumannii NOT DETECTED NOT DETECTED Final   Enterobacteriaceae species NOT DETECTED NOT DETECTED Final   Enterobacter cloacae complex NOT DETECTED NOT DETECTED Final   Escherichia coli NOT DETECTED NOT DETECTED Final   Klebsiella oxytoca NOT DETECTED NOT DETECTED Final   Klebsiella pneumoniae NOT DETECTED NOT DETECTED Final   Proteus species NOT DETECTED NOT DETECTED Final   Serratia marcescens NOT DETECTED NOT DETECTED Final   Haemophilus influenzae NOT DETECTED NOT DETECTED Final   Neisseria meningitidis NOT DETECTED NOT DETECTED Final   Pseudomonas aeruginosa NOT DETECTED NOT DETECTED Final   Candida albicans NOT DETECTED NOT DETECTED Final   Candida glabrata NOT DETECTED NOT DETECTED Final   Candida krusei NOT DETECTED NOT DETECTED Final   Candida parapsilosis NOT DETECTED NOT DETECTED Final   Candida tropicalis NOT DETECTED NOT DETECTED Final    Comment: Performed at [facility], [address] [place], [place]  [address]  SARS Coronavirus 2 (CEPHEID- Performed in [facility] hospital lab), Hosp Order     Status: None   Collection Time: 02/16/19  4:42 PM   Specimen: Nasopharyngeal Swab  Result Value Ref Range Status  SARS Coronavirus 2 NEGATIVE NEGATIVE Final    Comment: (NOTE) If result is NEGATIVE SARS-CoV-2 target nucleic acids are NOT DETECTED. The SARS-CoV-2 RNA is generally detectable in upper and lower  respiratory specimens during the acute phase of infection. The lowest  concentration of SARS-CoV-2 viral copies this assay can detect is 250  copies / mL. A negative result does not preclude SARS-CoV-2 infection  and should not be used as the sole basis for treatment or other  patient management decisions.  A negative result may occur with  improper specimen collection / handling, submission of specimen other  than nasopharyngeal swab, presence of viral mutation(s) within the  areas targeted by this assay, and inadequate number of viral copies  (<250 copies / mL). A negative result must be combined with clinical  observations, patient history, and epidemiological information. If result is POSITIVE SARS-CoV-2 target nucleic acids are DETECTED. The SARS-CoV-2 RNA is generally detectable in upper and lower  respiratory specimens dur ing the acute phase of infection.  Positive  results are indicative of active infection with SARS-CoV-2.  Clinical  correlation with patient history and other diagnostic information is  necessary to determine patient infection status.  Positive results do  not rule out bacterial infection or co-infection with other viruses. If result is PRESUMPTIVE POSTIVE SARS-CoV-2 nucleic acids MAY BE PRESENT.   A presumptive positive result was obtained on the submitted specimen  and confirmed on repeat testing.  While 2019 novel coronavirus  (SARS-CoV-2) nucleic acids may be present in the submitted sample  additional confirmatory testing may be necessary for  epidemiological  and / or clinical management purposes  to differentiate between  SARS-CoV-2 and other Sarbecovirus currently known to infect humans.  If clinically indicated additional testing with an alternate test  methodology [phone]) is advised. The SARS-CoV-2 RNA is generally  detectable in upper and lower respiratory sp ecimens during the acute  phase of infection. The expected result is Negative. Fact Sheet for Patients:  StrictlyIdeas.no Fact Sheet for Healthcare Providers: BankingDealers.co.za This test is not yet approved or cleared by the [place] FDA and has been authorized for detection and/or diagnosis of SARS-CoV-2 by FDA under an Emergency Use Authorization (EUA).  This EUA will remain in effect (meaning this test can be used) for the duration of the COVID-19 declaration under Section 564(b)(1) of the Act, 21 U.S.C. section 360bbb-3(b)(1), unless the authorization is terminated or revoked sooner. Performed at [facility], [address] [place], [place] [address]   Culture, blood (single) w Reflex to ID Panel     Status: None   Collection Time: 02/18/19 12:14 PM   Specimen: BLOOD  Result Value Ref Range Status   Specimen Description BLOOD BLOOD LEFT ARM  Final   Special Requests   Final    BOTTLES DRAWN AEROBIC AND ANAEROBIC Blood Culture results may not be optimal due to an excessive volume of blood received in culture bottles   Culture   Final    NO GROWTH 5 DAYS Performed at [facility], [address], [place], [place] [address]    Report Status 02/23/2019 FINAL  Final    RADIOLOGY:  No results found.  Follow up with PCP in 1 week.  Management plans discussed with the patient, family and they are in agreement.  CODE STATUS:     Code Status Orders  (From admission, onward)         Start     Ordered   03/07/19 1540  Do not attempt resuscitation (DNR)  Continuous     Question Answer Comment  In the event of cardiac or respiratory ARREST Do not call a "code blue"   In the event of cardiac or respiratory ARREST Do not perform Intubation, CPR, defibrillation or ACLS   In the event of cardiac or respiratory ARREST Use medication by any route, position, wound care, and other measures to relive pain and suffering. May use oxygen, suction and manual treatment of airway obstruction as needed for comfort.      03/07/19 1539        Code Status History    Date Active Date Inactive Code Status Order ID Comments User Context   02/16/2019 2000 03/07/2019 1539 Full Code [id number]  Alford HighlandWieting, Richard, MD ED   Advance Care Planning Activity    Advance Directive Documentation     Most Recent Value  Type of Advance Directive  Living will  Pre-existing out of facility DNR order (yellow form or pink MOST form)  -  "MOST" Form in Place?  -      TOTAL TIME TAKING CARE OF THIS PATIENT ON DAY OF DISCHARGE: more than 30 minutes.   Molinda BailiffSrikar R Mackenize Delgadillo M.D on 03/15/2019 at 11:06 AM  Between 7am to 6pm - Pager - [phone]  After 6pm go to [email or web address] - password [email or web address]  [facility]  Office  [phone]  CC: Primary care physician; Sula RumpleVirk, Charanjit, MD  Note: This dictation was prepared with Dragon dictation along with smaller phrase technology. Any transcriptional errors that result from this process are unintentional.

## 2019-03-15 NOTE — Progress Notes (Signed)
Pt left via ems  To hospice home. Iv pain med given prior to leaving. 02 in use at 2 l [place]

## 2019-03-15 NOTE — Progress Notes (Signed)
Visit made to new hospice home referral. Patient seen lying in bed, eyes closed, some mumbling noted with gentle tactile stimuli. Patient's sister Alta Corning at bedside. She report she was called by staff RN last night d/t patient "having trouble breathing". Alta Corning also reports she and her daughter witnessed a seizure while they were with the patient last night. Per chart review patient has received one dose of PRN lorazepam and one dose of PRN liquid morphine prior to midnight last night. Patient is not eating or drinking and appears more lethargic this morning. Writer met in the family room with Alta Corning to initiate education regarding hospice services, philosophy and team approach to care with understanding voiced. Consents signed and faxed to referral. Report called to the hospice home, EMS notified for transport, hospital care team and family updated. Flo Shanks BSN, RN, [facility] [phone]

## 2019-03-15 NOTE — Plan of Care (Signed)
  Problem: Education: Goal: Knowledge of General Education information will improve Description: Including pain rating scale, medication(s)/side effects and non-pharmacologic comfort measures Outcome: Not Progressing   

## 2019-03-15 NOTE — Care Management Important Message (Signed)
Important Message  Patient Details  Name: Claudia Case MRN: [id number] Date of Birth: July 06, 1941   Medicare Important Message Given:  Yes     Juliann Pulse A Shaft Corigliano 03/15/2019, 11:22 AM

## 2019-03-30 DEATH — deceased

## 2021-06-15 IMAGING — CT CT HEAD WITHOUT CONTRAST
3 series · 15 of 47 positions shown, 18 images · non-contrast
Comparison: February 21, 2019

CLINICAL DATA: Acute mental status change

EXAM:
CT HEAD WITHOUT CONTRAST
TECHNIQUE: Contiguous axial images were obtained from the base of the skull
through the vertex without intravenous contrast.

[Series 3: head wo · axial · 0.41mm/px · z∈[-99,+26]mm · 9 of 30 slices shown, 12 images]
[im 3/30  brain]
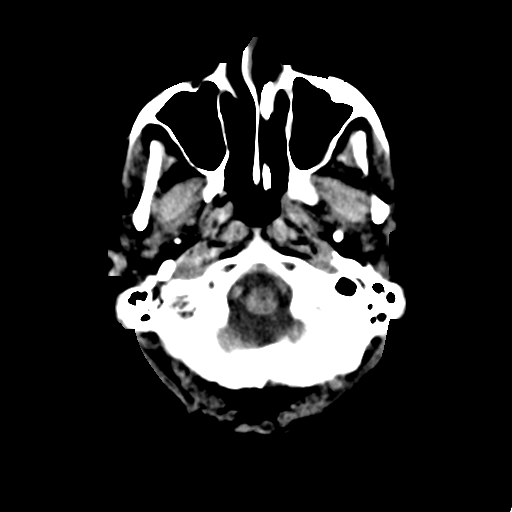
[im 3/30  bone]
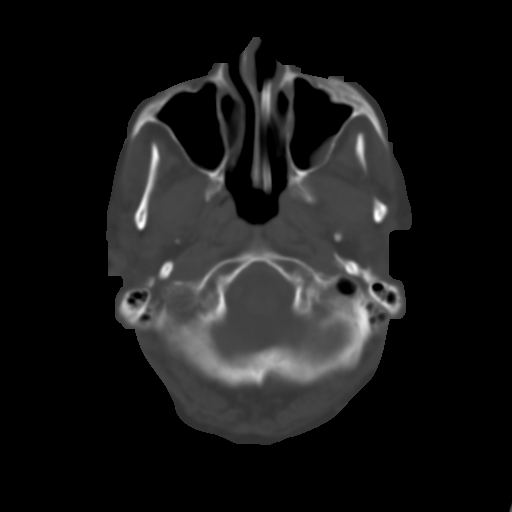
[im 6/30  brain]
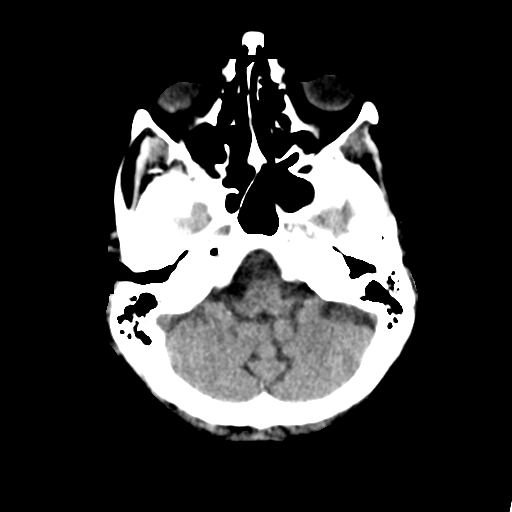
[im 9/30  brain]
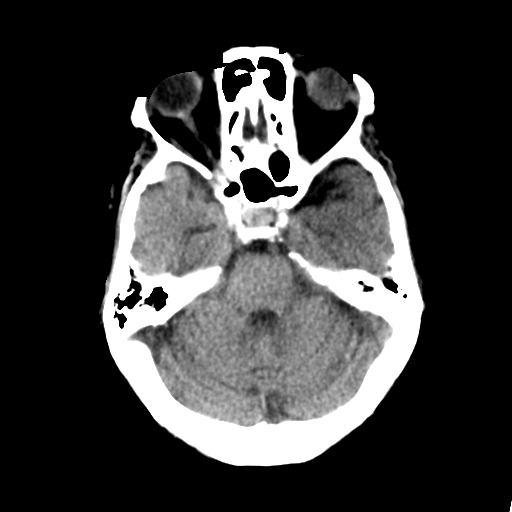
[im 12/30  brain]
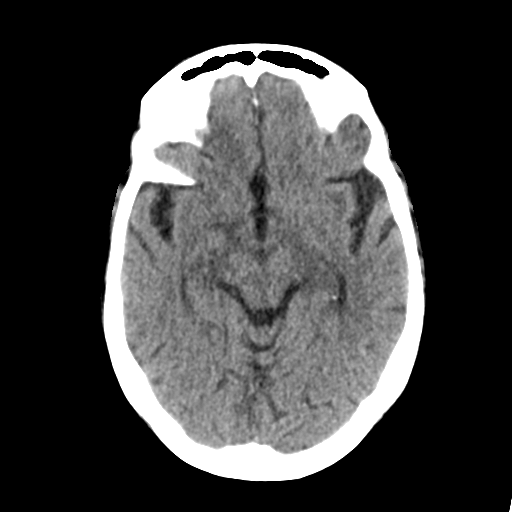
[im 16/30  brain]
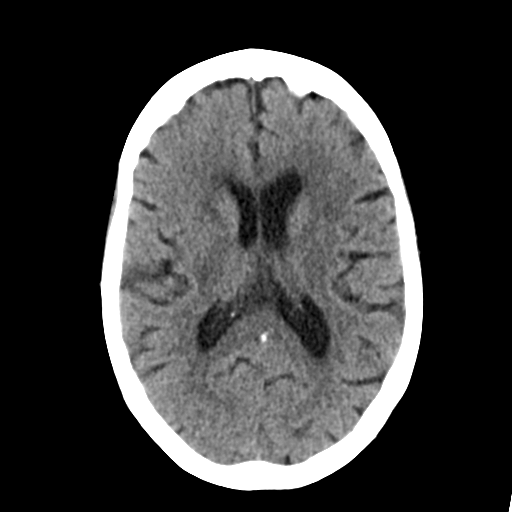
[im 16/30  bone]
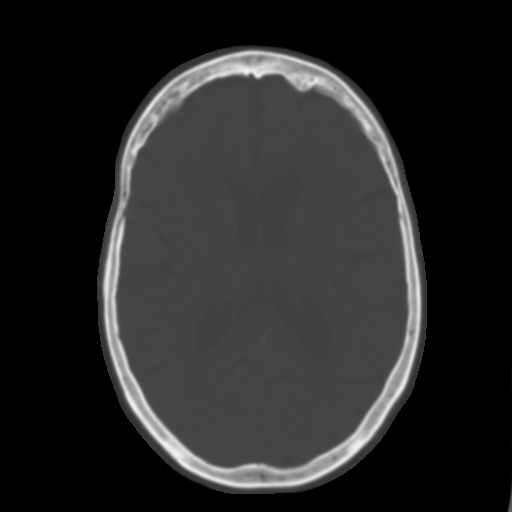
[im 19/30  brain]
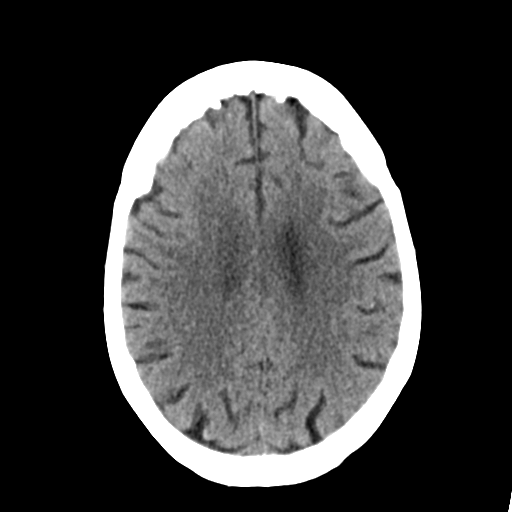
[im 22/30  brain]
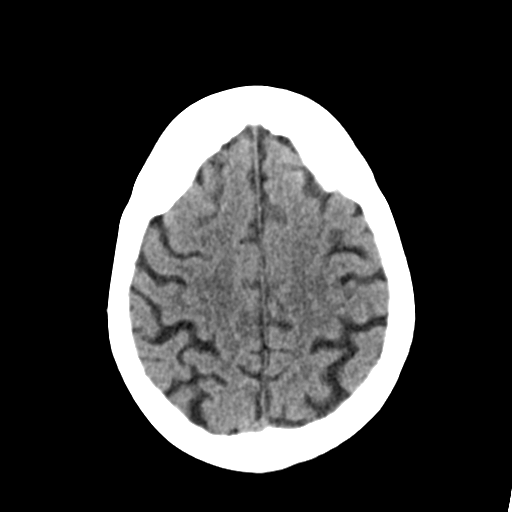
[im 25/30  brain]
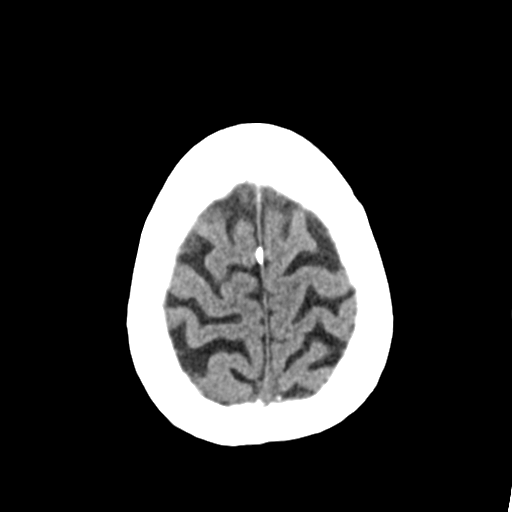
[im 28/30  brain]
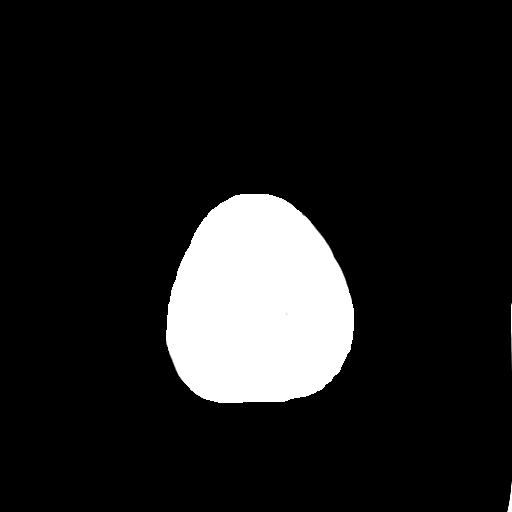
[im 28/30  bone]
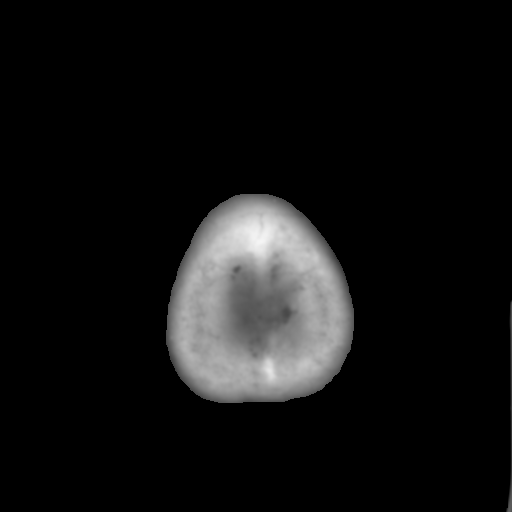

[Series 4: coronal soft tissue · coronal · 0.30mm/px · 3 of 65 slices shown]
[im 22/65  brain]
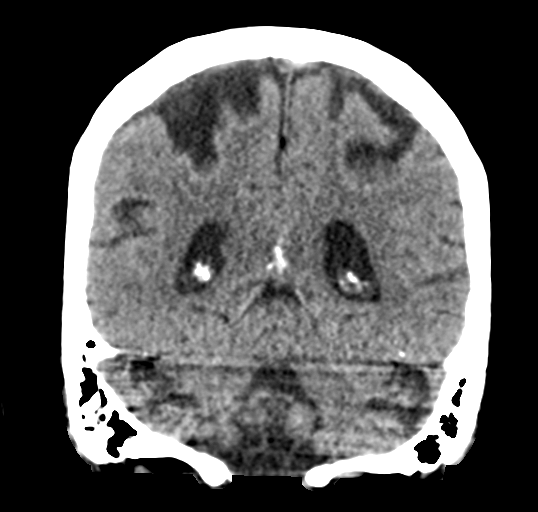
[im 29/65  brain]
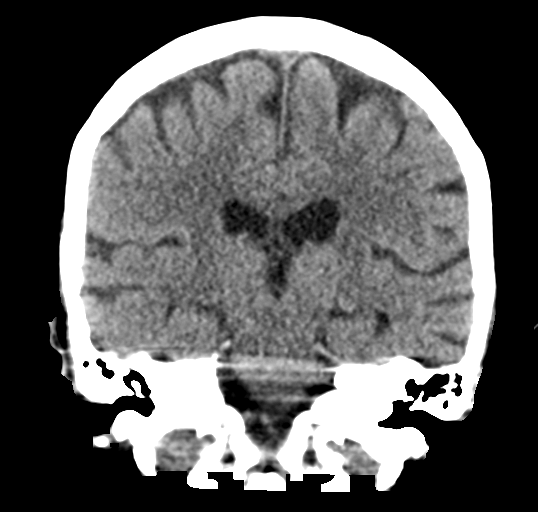
[im 36/65  brain]
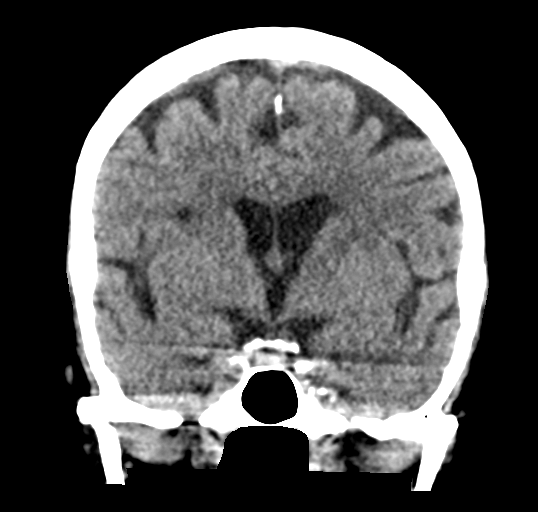

[Series 5: sagittal soft tissue · sagittal · 0.32mm/px · 3 of 47 slices shown]
[im 16/47  brain]
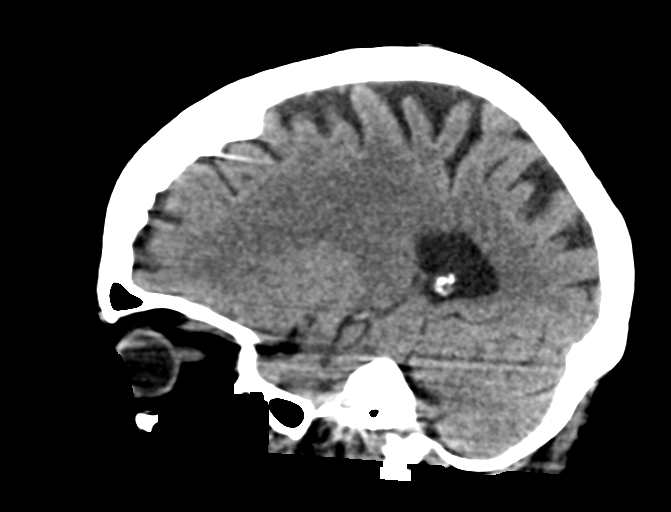
[im 24/47  brain]
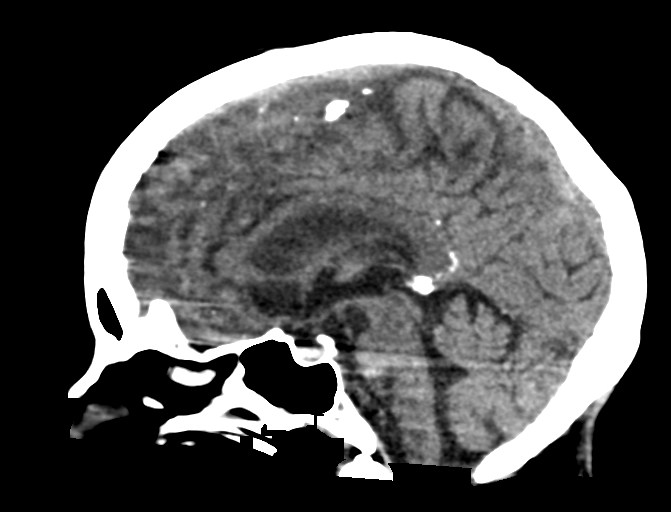
[im 31/47  brain]
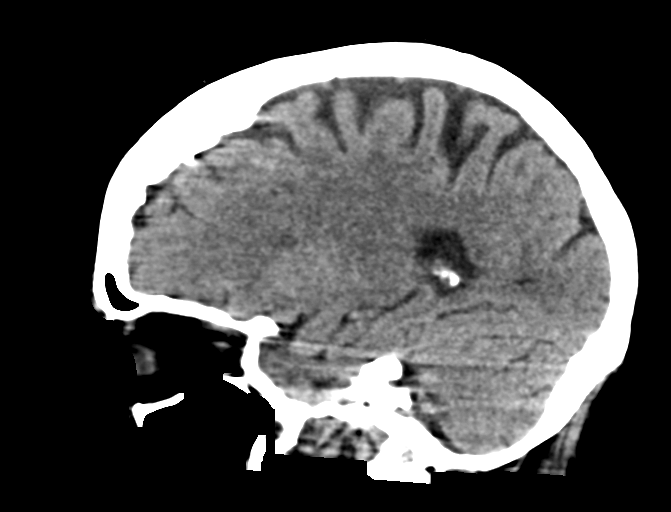

[15 of 47 positions shown; findings below may reference images not displayed]

FINDINGS: Brain: No subdural, epidural, or subarachnoid hemorrhage. Ventricles
and sulci are unchanged. No mass effect or midline shift.
Cerebellum, brainstem, and basal cisterns are normal. A lacunar
infarct in the right corona radiata is stable. No acute cortical
ischemia or infarct.

Vascular: No hyperdense vessel or unexpected calcification.

Skull: Normal. Negative for fracture or focal lesion.

Sinuses/Orbits: Mucosal thickening is seen in the maxillary sinuses.
Paranasal sinuses otherwise normal. The left mastoid air cells and
middle ears are well aerated. Minimal fluid is seen in the inferior
right mastoid air cells without bony erosion or overlying soft
tissue swelling.

Other: None.
IMPRESSION: 1. No acute intracranial abnormalities. Stable lacunar infarct in
the right corona radiata.
2. Sinus disease as above.
3. Mild fluid in the right mastoid air cells without bony erosion or
overlying soft tissue swelling.

## 2021-06-20 IMAGING — CR CHEST - 2 VIEW
2 series · 2 of 2 positions shown · non-contrast
Comparison: Prior exams, most recent dated 03/04/2019

CLINICAL DATA: 77 y.o female with osteoarthritis, GERD,
hyperlipidemia, hypertension, psoriatic arthritis, who was admitted
to [HOSPITAL] on 02/16/2019 for evaluation of severe stomatitis in response
to methotrexate. Nonsmoker.

EXAM:
CHEST - 2 VIEW

[chest lat]
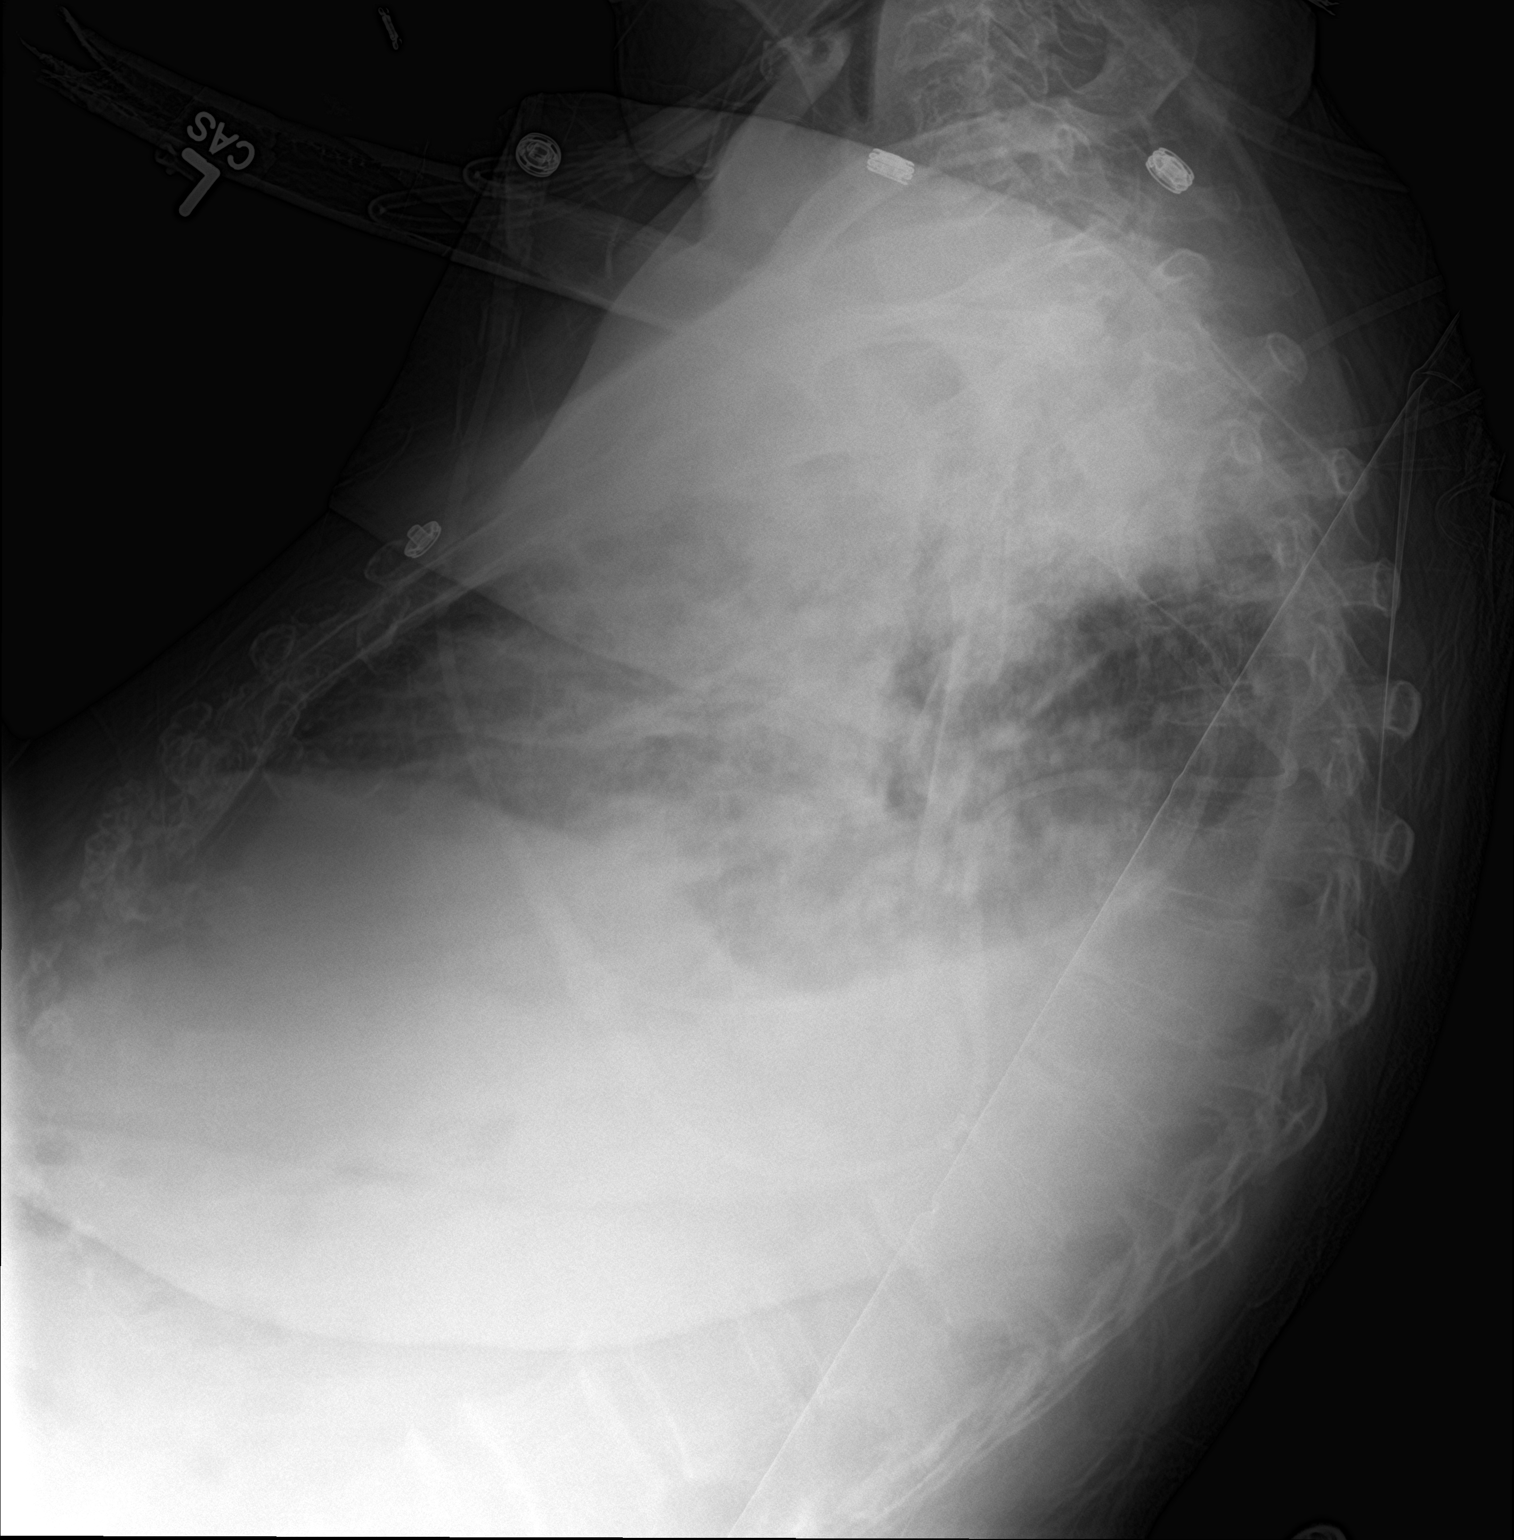

[chest ap]
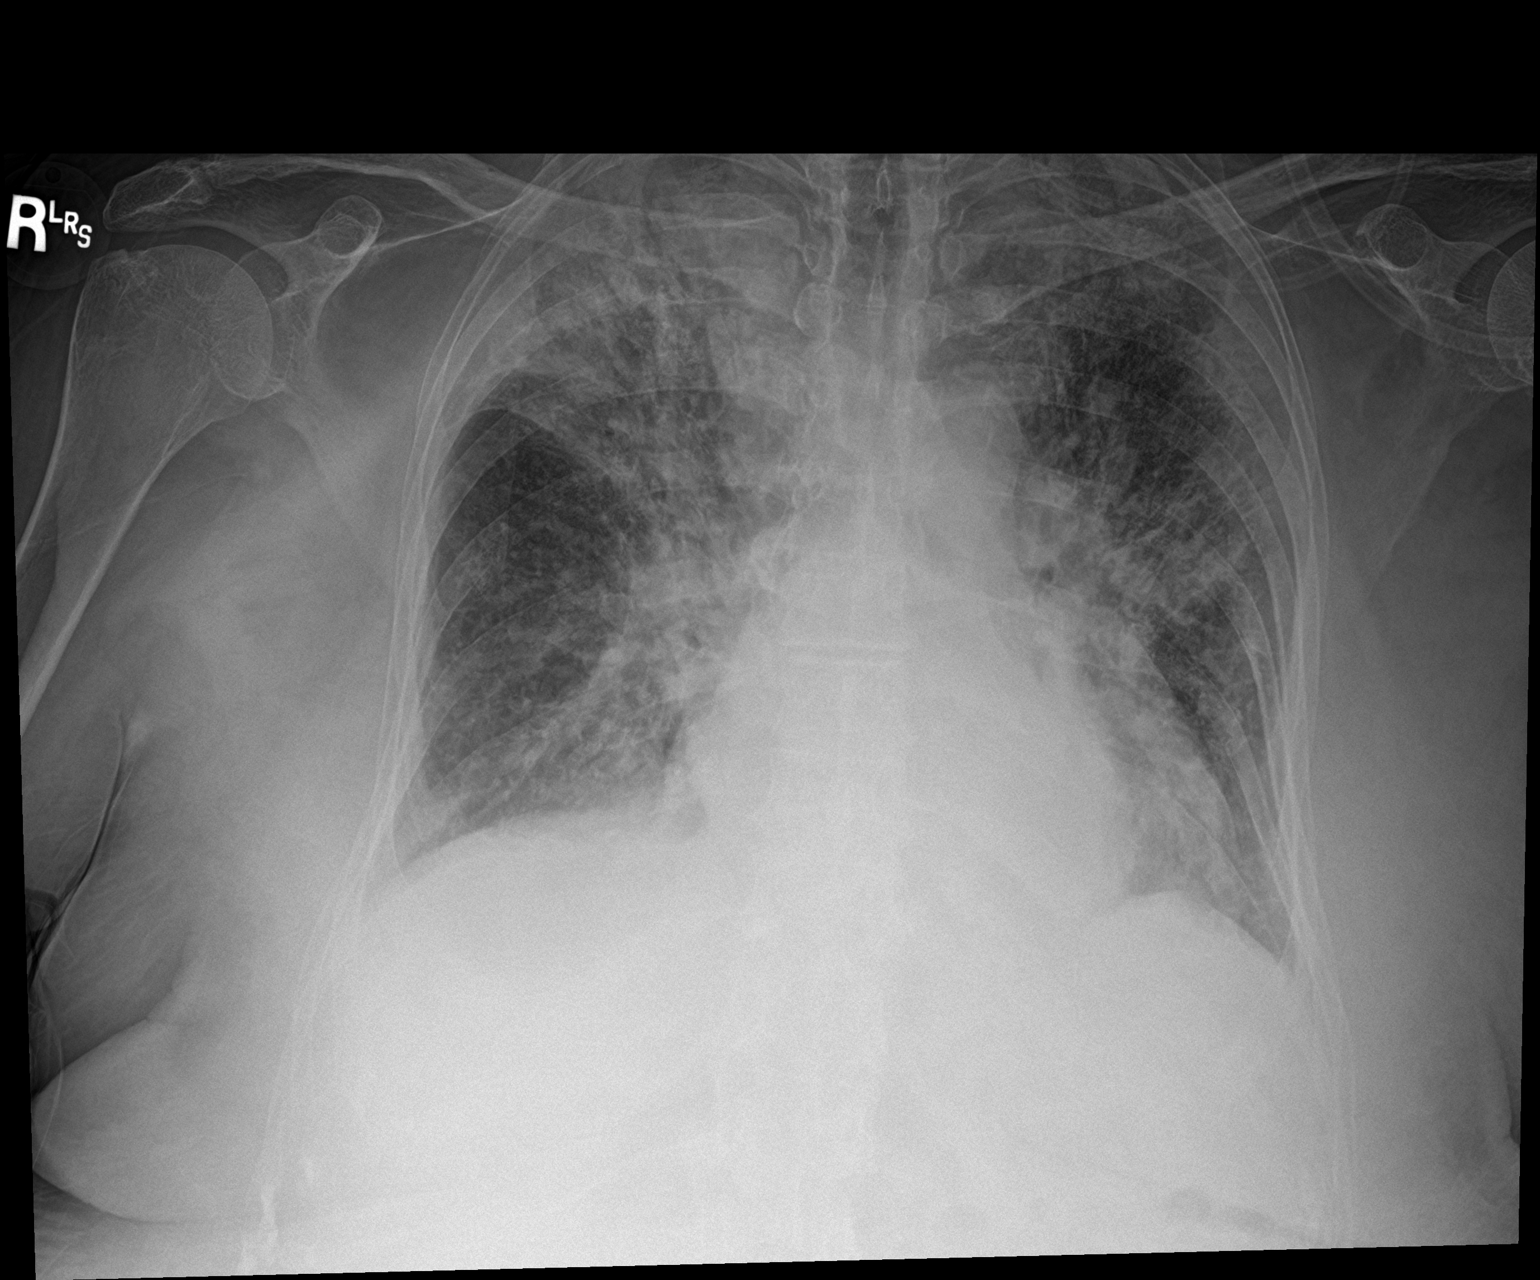

[2 of 2 positions shown; findings below may reference images not displayed]

FINDINGS: Interstitial and airspace opacities are again noted, greatest in the
right upper lobe. There is been a slight increase in lung
opacification in the medial right upper lobe. Other lung opacities
are stable.

Lateral view shows moderate bilateral pleural effusions layering
posteriorly.

No pneumothorax.
IMPRESSION: 1. Bilateral interstitial and airspace lung opacities, consistent
with multifocal pneumonia, with a mild increase in the airspace
opacity in the medial right upper lobe since the most recent prior
exam. Moderate bilateral pleural effusions.
